# Patient Record
Sex: Female | Born: 1935
Health system: Southern US, Community
[De-identification: ages and names within clinical notes are randomized; demographics above are authoritative.]

## PROBLEM LIST (undated history)

## (undated) DIAGNOSIS — R928 Other abnormal and inconclusive findings on diagnostic imaging of breast: Secondary | ICD-10-CM

## (undated) DIAGNOSIS — E119 Type 2 diabetes mellitus without complications: Secondary | ICD-10-CM

## (undated) DIAGNOSIS — L570 Actinic keratosis: Secondary | ICD-10-CM

## (undated) DIAGNOSIS — I10 Essential (primary) hypertension: Secondary | ICD-10-CM

## (undated) DIAGNOSIS — Z960 Presence of urogenital implants: Secondary | ICD-10-CM

## (undated) DIAGNOSIS — Z1239 Encounter for other screening for malignant neoplasm of breast: Secondary | ICD-10-CM

## (undated) DIAGNOSIS — R920 Mammographic microcalcification found on diagnostic imaging of breast: Secondary | ICD-10-CM

## (undated) HISTORY — DX: Essential (primary) hypertension: I10

## (undated) HISTORY — DX: Type 2 diabetes mellitus without complications: E11.9

## (undated) HISTORY — DX: Encounter for other screening for malignant neoplasm of breast: Z12.39

## (undated) HISTORY — DX: Actinic keratosis: L57.0

## (undated) HISTORY — DX: Other abnormal and inconclusive findings on diagnostic imaging of breast: R92.8

## (undated) HISTORY — DX: Presence of urogenital implants: Z96.0

## (undated) HISTORY — DX: Mammographic microcalcification found on diagnostic imaging of breast: R92.0

## (undated) HISTORY — PX: SKIN LESION EXCISION: SHX2412

## (undated) HISTORY — PX: BREAST EXCISIONAL BIOPSY: SUR124

---

## 2004-03-08 ENCOUNTER — Ambulatory Visit: Payer: Self-pay | Admitting: Family Medicine

## 2004-04-08 ENCOUNTER — Ambulatory Visit: Payer: Self-pay | Admitting: Family Medicine

## 2004-05-08 ENCOUNTER — Ambulatory Visit: Payer: Self-pay | Admitting: Family Medicine

## 2004-10-13 ENCOUNTER — Ambulatory Visit: Payer: Self-pay | Admitting: Family Medicine

## 2004-11-06 ENCOUNTER — Ambulatory Visit: Payer: Self-pay | Admitting: Family Medicine

## 2004-12-24 ENCOUNTER — Ambulatory Visit: Payer: Self-pay

## 2005-01-01 ENCOUNTER — Ambulatory Visit: Payer: Self-pay | Admitting: Family Medicine

## 2005-01-07 ENCOUNTER — Other Ambulatory Visit: Payer: Self-pay

## 2005-01-07 ENCOUNTER — Ambulatory Visit: Payer: Self-pay | Admitting: Unknown Physician Specialty

## 2005-01-19 ENCOUNTER — Ambulatory Visit: Payer: Self-pay | Admitting: Unknown Physician Specialty

## 2006-02-03 ENCOUNTER — Ambulatory Visit: Payer: Self-pay | Admitting: Family Medicine

## 2007-03-08 ENCOUNTER — Ambulatory Visit: Payer: Self-pay | Admitting: Family Medicine

## 2008-01-24 ENCOUNTER — Ambulatory Visit: Payer: Self-pay | Admitting: Specialist

## 2008-02-02 ENCOUNTER — Ambulatory Visit: Payer: Self-pay | Admitting: Unknown Physician Specialty

## 2008-03-13 ENCOUNTER — Ambulatory Visit: Payer: Self-pay | Admitting: Family Medicine

## 2008-08-15 ENCOUNTER — Ambulatory Visit: Payer: Self-pay | Admitting: Family Medicine

## 2008-10-19 ENCOUNTER — Emergency Department: Payer: Self-pay | Admitting: Emergency Medicine

## 2008-11-02 ENCOUNTER — Inpatient Hospital Stay: Payer: Self-pay | Admitting: Surgery

## 2008-11-07 ENCOUNTER — Encounter: Payer: Self-pay | Admitting: Internal Medicine

## 2008-12-06 ENCOUNTER — Encounter: Payer: Self-pay | Admitting: Internal Medicine

## 2009-03-15 ENCOUNTER — Ambulatory Visit: Payer: Self-pay | Admitting: Family Medicine

## 2010-03-17 ENCOUNTER — Ambulatory Visit: Payer: Self-pay | Admitting: Family Medicine

## 2011-04-07 ENCOUNTER — Ambulatory Visit: Payer: Self-pay | Admitting: Family Medicine

## 2011-06-09 HISTORY — PX: BREAST BIOPSY: SHX20

## 2012-02-11 ENCOUNTER — Emergency Department: Payer: Self-pay | Admitting: Emergency Medicine

## 2012-02-11 LAB — BASIC METABOLIC PANEL
BUN: 24 mg/dL — ABNORMAL HIGH (ref 7–18)
Calcium, Total: 9.1 mg/dL (ref 8.5–10.1)
Chloride: 107 mmol/L (ref 98–107)
Creatinine: 1.42 mg/dL — ABNORMAL HIGH (ref 0.60–1.30)
Osmolality: 286 (ref 275–301)
Potassium: 3.9 mmol/L (ref 3.5–5.1)
Sodium: 140 mmol/L (ref 136–145)

## 2012-02-11 LAB — URINALYSIS, COMPLETE
Bacteria: NONE SEEN
Nitrite: NEGATIVE
Protein: NEGATIVE
RBC,UR: 11 /HPF (ref 0–5)
WBC UR: 118 /HPF (ref 0–5)

## 2012-02-11 LAB — CBC WITH DIFFERENTIAL/PLATELET
Basophil %: 1.1 %
Eosinophil #: 0.2 10*3/uL (ref 0.0–0.7)
Eosinophil %: 1.8 %
HCT: 38.6 % (ref 35.0–47.0)
Monocyte #: 0.9 x10 3/mm (ref 0.2–0.9)
Monocyte %: 8 %
Neutrophil %: 62 %
Platelet: 282 10*3/uL (ref 150–440)
RBC: 3.89 10*6/uL (ref 3.80–5.20)
WBC: 10.8 10*3/uL (ref 3.6–11.0)

## 2012-03-02 ENCOUNTER — Ambulatory Visit: Payer: Self-pay | Admitting: Hematology and Oncology

## 2012-03-02 LAB — CBC CANCER CENTER
Basophil #: 0.1 x10 3/mm (ref 0.0–0.1)
Lymphocyte #: 3.2 x10 3/mm (ref 1.0–3.6)
Lymphocyte %: 26.7 %
MCH: 33 pg (ref 26.0–34.0)
MCHC: 32.7 g/dL (ref 32.0–36.0)
MCV: 101 fL — ABNORMAL HIGH (ref 80–100)
Monocyte #: 1 x10 3/mm — ABNORMAL HIGH (ref 0.2–0.9)
Neutrophil %: 60.7 %
Platelet: 309 x10 3/mm (ref 150–440)
RDW: 14.6 % — ABNORMAL HIGH (ref 11.5–14.5)

## 2012-03-02 LAB — URINALYSIS, COMPLETE
Blood: NEGATIVE
Hyaline Cast: 1
Ketone: NEGATIVE
Nitrite: NEGATIVE
Ph: 5 (ref 4.5–8.0)
Protein: NEGATIVE
Specific Gravity: 1.019 (ref 1.003–1.030)

## 2012-03-08 ENCOUNTER — Ambulatory Visit: Payer: Self-pay | Admitting: Hematology and Oncology

## 2012-04-18 ENCOUNTER — Ambulatory Visit: Payer: Self-pay | Admitting: Family Medicine

## 2012-04-20 ENCOUNTER — Ambulatory Visit: Payer: Self-pay | Admitting: Family Medicine

## 2012-05-16 ENCOUNTER — Ambulatory Visit: Payer: Self-pay | Admitting: General Surgery

## 2012-05-21 ENCOUNTER — Encounter: Payer: Self-pay | Admitting: *Deleted

## 2013-04-21 ENCOUNTER — Ambulatory Visit: Payer: Self-pay | Admitting: Family Medicine

## 2014-05-05 ENCOUNTER — Emergency Department: Payer: Self-pay | Admitting: Emergency Medicine

## 2014-05-05 LAB — COMPREHENSIVE METABOLIC PANEL
ALT: 43 U/L
Albumin: 3.4 g/dL (ref 3.4–5.0)
Alkaline Phosphatase: 77 U/L
Anion Gap: 6 — ABNORMAL LOW (ref 7–16)
BUN: 24 mg/dL — AB (ref 7–18)
Bilirubin,Total: 0.9 mg/dL (ref 0.2–1.0)
CALCIUM: 9.1 mg/dL (ref 8.5–10.1)
CO2: 27 mmol/L (ref 21–32)
Chloride: 103 mmol/L (ref 98–107)
Creatinine: 1.47 mg/dL — ABNORMAL HIGH (ref 0.60–1.30)
EGFR (African American): 43 — ABNORMAL LOW
EGFR (Non-African Amer.): 36 — ABNORMAL LOW
Glucose: 226 mg/dL — ABNORMAL HIGH (ref 65–99)
OSMOLALITY: 283 (ref 275–301)
POTASSIUM: 3.9 mmol/L (ref 3.5–5.1)
SGOT(AST): 33 U/L (ref 15–37)
Sodium: 136 mmol/L (ref 136–145)
Total Protein: 7 g/dL (ref 6.4–8.2)

## 2014-05-05 LAB — URINALYSIS, COMPLETE
Bacteria: NONE SEEN
Bilirubin,UR: NEGATIVE
Blood: NEGATIVE
GLUCOSE, UR: NEGATIVE mg/dL (ref 0–75)
Hyaline Cast: 2
KETONE: NEGATIVE
Nitrite: NEGATIVE
PH: 6 (ref 4.5–8.0)
Protein: NEGATIVE
RBC,UR: <1 /HPF (ref 0–5)
Specific Gravity: 1.008 (ref 1.003–1.030)
Squamous Epithelial: 4

## 2014-05-05 LAB — CBC
HCT: 38 % (ref 35.0–47.0)
HGB: 12.6 g/dL (ref 12.0–16.0)
MCH: 33.8 pg (ref 26.0–34.0)
MCHC: 33.2 g/dL (ref 32.0–36.0)
MCV: 102 fL — ABNORMAL HIGH (ref 80–100)
Platelet: 284 10*3/uL (ref 150–440)
RBC: 3.73 10*6/uL — AB (ref 3.80–5.20)
RDW: 14 % (ref 11.5–14.5)
WBC: 10.7 10*3/uL (ref 3.6–11.0)

## 2014-05-05 LAB — LIPASE, BLOOD: Lipase: 175 U/L (ref 73–393)

## 2014-05-15 ENCOUNTER — Ambulatory Visit: Payer: Self-pay | Admitting: Family Medicine

## 2014-07-11 ENCOUNTER — Ambulatory Visit (INDEPENDENT_AMBULATORY_CARE_PROVIDER_SITE_OTHER): Payer: Medicare Other | Admitting: Podiatry

## 2014-07-11 ENCOUNTER — Ambulatory Visit (INDEPENDENT_AMBULATORY_CARE_PROVIDER_SITE_OTHER): Payer: Medicare Other

## 2014-07-11 ENCOUNTER — Encounter: Payer: Self-pay | Admitting: Podiatry

## 2014-07-11 VITALS — BP 131/71 | HR 66 | Resp 16 | Ht 66.0 in | Wt 140.0 lb

## 2014-07-11 DIAGNOSIS — E785 Hyperlipidemia, unspecified: Secondary | ICD-10-CM | POA: Insufficient documentation

## 2014-07-11 DIAGNOSIS — L600 Ingrowing nail: Secondary | ICD-10-CM

## 2014-07-11 DIAGNOSIS — E1142 Type 2 diabetes mellitus with diabetic polyneuropathy: Secondary | ICD-10-CM | POA: Insufficient documentation

## 2014-07-11 DIAGNOSIS — I2510 Atherosclerotic heart disease of native coronary artery without angina pectoris: Secondary | ICD-10-CM | POA: Insufficient documentation

## 2014-07-11 DIAGNOSIS — I471 Supraventricular tachycardia: Secondary | ICD-10-CM | POA: Insufficient documentation

## 2014-07-11 DIAGNOSIS — E1169 Type 2 diabetes mellitus with other specified complication: Secondary | ICD-10-CM | POA: Insufficient documentation

## 2014-07-11 DIAGNOSIS — I10 Essential (primary) hypertension: Secondary | ICD-10-CM | POA: Insufficient documentation

## 2014-07-11 DIAGNOSIS — M2040 Other hammer toe(s) (acquired), unspecified foot: Secondary | ICD-10-CM

## 2014-07-11 DIAGNOSIS — E039 Hypothyroidism, unspecified: Secondary | ICD-10-CM | POA: Insufficient documentation

## 2014-07-11 DIAGNOSIS — M542 Cervicalgia: Secondary | ICD-10-CM | POA: Insufficient documentation

## 2014-07-11 DIAGNOSIS — E119 Type 2 diabetes mellitus without complications: Secondary | ICD-10-CM

## 2014-07-11 DIAGNOSIS — I4891 Unspecified atrial fibrillation: Secondary | ICD-10-CM | POA: Insufficient documentation

## 2014-07-11 MED ORDER — NEOMYCIN-POLYMYXIN-HC 3.5-10000-1 OT SOLN: OTIC | Status: DC

## 2014-07-11 NOTE — Progress Notes (Signed)
   Subjective:    Patient ID: Erika Ochoa, female    DOB: [birth year], 81 y.o.   MRN: [id number]  HPI Comments: Left great toenail has been giving me trouble for about a month or more. Left great medial corner  Red and sore  Diabetes , blood sugar yesterday was 130      Review of Systems  HENT: Positive for hearing loss.   Endocrine:       Diabetes   All other systems reviewed and are negative.      Objective:   Physical Exam: I have reviewed her past medical history medications allergy surgery social history and review of systems. Pulses are strongly palpable bilateral. Slight decreased sensorium per Semmes-Weinstein monofilament bilateral. Deep tendon reflexes are intact bilateral and muscle strength is 5 over 5 dorsiflexes plantar flexors and inverters everters all digits of musculature is intact. Orthopedic evaluation and straight all joints distal to the ankle for range of motion without crepitation. Cutaneous evaluation demonstrates a well-hydrated cutis with exception of a sharper incurvated nail margin to the tibial border of the hallux left with erythema and mild purulence.      Assessment & Plan:  Assessment: Ingrown nail paronychia abscess hallux left tibial border.  Plan: Chemical matrixectomy was performed today after local anesthetic was achieved. Matrixectomy was performed utilizing 3 applications of phenol which was utilized isopropyl alcohol and a dry sterile compressive dressing was placed. She was given both oral and written home-going instructions as well as a prescription for Cortisporin otic and its use. I will follow up with her in 1 week.

## 2014-07-11 NOTE — Patient Instructions (Addendum)
Diabetes and Foot Care Diabetes may cause you to have problems because of poor blood supply (circulation) to your feet and legs. This may cause the skin on your feet to become thinner, break easier, and heal more slowly. Your skin may become dry, and the skin may peel and crack. You may also have nerve damage in your legs and feet causing decreased feeling in them. You may not notice minor injuries to your feet that could lead to infections or more serious problems. Taking care of your feet is one of the most important things you can do for yourself.  HOME CARE INSTRUCTIONS  Wear shoes at all times, even in the house. Do not go barefoot. Bare feet are easily injured.  Check your feet daily for blisters, cuts, and redness. If you cannot see the bottom of your feet, use a mirror or ask someone for help.  Wash your feet with warm water (do not use hot water) and mild soap. Then pat your feet and the areas between your toes until they are completely dry. Do not soak your feet as this can dry your skin.  Apply a moisturizing lotion or petroleum jelly (that does not contain alcohol and is unscented) to the skin on your feet and to dry, brittle toenails. Do not apply lotion between your toes.  Trim your toenails straight across. Do not dig under them or around the cuticle. File the edges of your nails with an emery board or nail file.  Do not cut corns or calluses or try to remove them with medicine.  Wear clean socks or stockings every day. Make sure they are not too tight. Do not wear knee-high stockings since they may decrease blood flow to your legs.  Wear shoes that fit properly and have enough cushioning. To break in new shoes, wear them for just a few hours a day. This prevents you from injuring your feet. Always look in your shoes before you put them on to be sure there are no objects inside.  Do not cross your legs. This may decrease the blood flow to your feet.  If you find a minor scrape,  cut, or break in the skin on your feet, keep it and the skin around it clean and dry. These areas may be cleansed with mild soap and water. Do not cleanse the area with peroxide, alcohol, or iodine.  When you remove an adhesive bandage, be sure not to damage the skin around it.  If you have a wound, look at it several times a day to make sure it is healing.  Do not use heating pads or hot water bottles. They may burn your skin. If you have lost feeling in your feet or legs, you may not know it is happening until it is too late.  Make sure your health care provider performs a complete foot exam at least annually or more often if you have foot problems. Report any cuts, sores, or bruises to your health care provider immediately. SEEK MEDICAL CARE IF:   You have an injury that is not healing.  You have cuts or breaks in the skin.  You have an ingrown nail.  You notice redness on your legs or feet.  You feel burning or tingling in your legs or feet.  You have pain or cramps in your legs and feet.  Your legs or feet are numb.  Your feet always feel cold. SEEK IMMEDIATE MEDICAL CARE IF:   There is increasing redness,   swelling, or pain in or around a wound.  There is a red line that goes up your leg.  Pus is coming from a wound.  You develop a fever or as directed by your health care provider.  You notice a bad smell coming from an ulcer or wound. Document Released: 05/22/2000 Document Revised: 01/25/2013 Document Reviewed: 11/01/2012 [facility] Patient Information 2015 [place], [place]. This information is not intended to replace advice given to you by your health care provider. Make sure you discuss any questions you have with your health care provider. Betadine Soak Instructions  Purchase an 8 oz. bottle of BETADINE solution (Povidone)  THE DAY AFTER THE PROCEDURE  Place 1 tablespoon of betadine solution in a quart of warm tap water.  Submerge your foot or feet with outer  bandage intact for the initial soak; this will allow the bandage to become moist and wet for easy lift off.  Once you remove your bandage, continue to soak in the solution for 20 minutes.  This soak should be done twice a day.  Next, remove your foot or feet from solution, blot dry the affected area and cover.  You may use a band aid large enough to cover the area or use gauze and tape.  Apply other medications to the area as directed by the doctor such as cortisporin otic solution (ear drops) or neosporin.  IF YOUR SKIN BECOMES IRRITATED WHILE USING THESE INSTRUCTIONS, IT IS OKAY TO SWITCH TO EPSOM SALTS AND WATER OR WHITE VINEGAR AND WATER.

## 2014-07-18 ENCOUNTER — Ambulatory Visit (INDEPENDENT_AMBULATORY_CARE_PROVIDER_SITE_OTHER): Payer: Medicare Other | Admitting: Podiatry

## 2014-07-18 DIAGNOSIS — E119 Type 2 diabetes mellitus without complications: Secondary | ICD-10-CM

## 2014-07-18 DIAGNOSIS — L600 Ingrowing nail: Secondary | ICD-10-CM

## 2014-07-18 NOTE — Progress Notes (Signed)
She presents today for follow-up of her matrixectomy tibial and fibular border of the hallux left. She states that seems to be doing just fine and continues to be soaking in Betadine and warm water regularly.  Objective: No erythema edema cellulitis drainage or odor to the hallux left.  Assessment: Well-healed surgical toe hallux left.  Plan: Discontinue the use of Betadine. Start with Epsom salts and warm water soaks twice daily continue the use of Cortisporin otic twice daily after soaking. She will also covered in the daily open at night with a Band-Aid I will follow up with her in 1-2 weeks if necessary. Otherwise she is to continue to soak until completely resolved.

## 2014-11-28 ENCOUNTER — Other Ambulatory Visit: Payer: Self-pay | Admitting: Gastroenterology

## 2014-11-28 DIAGNOSIS — R1084 Generalized abdominal pain: Secondary | ICD-10-CM

## 2014-11-28 DIAGNOSIS — R140 Abdominal distension (gaseous): Secondary | ICD-10-CM

## 2014-12-03 ENCOUNTER — Ambulatory Visit
Admission: RE | Admit: 2014-12-03 | Discharge: 2014-12-03 | Disposition: A | Discharge status: Home / Self Care | Payer: Medicare Other | Source: Ambulatory Visit | Attending: Gastroenterology | Admitting: Gastroenterology

## 2014-12-03 DIAGNOSIS — R140 Abdominal distension (gaseous): Secondary | ICD-10-CM

## 2014-12-03 DIAGNOSIS — R1084 Generalized abdominal pain: Secondary | ICD-10-CM

## 2014-12-03 DIAGNOSIS — R109 Unspecified abdominal pain: Secondary | ICD-10-CM | POA: Diagnosis present

## 2014-12-03 MED ORDER — IOHEXOL 300 MG/ML  SOLN
75.0000 mL | Freq: Once | INTRAMUSCULAR | Status: AC | PRN
  Administered 2014-12-03: 75 mL via INTRAVENOUS

## 2015-02-25 DIAGNOSIS — E538 Deficiency of other specified B group vitamins: Secondary | ICD-10-CM | POA: Insufficient documentation

## 2015-05-13 ENCOUNTER — Other Ambulatory Visit: Payer: Self-pay | Admitting: Family Medicine

## 2015-05-13 DIAGNOSIS — Z1231 Encounter for screening mammogram for malignant neoplasm of breast: Secondary | ICD-10-CM

## 2015-05-20 ENCOUNTER — Ambulatory Visit
Admission: RE | Admit: 2015-05-20 | Discharge: 2015-05-20 | Disposition: A | Discharge status: Home / Self Care | Payer: Medicare Other | Source: Ambulatory Visit | Attending: Family Medicine | Admitting: Family Medicine

## 2015-05-20 ENCOUNTER — Other Ambulatory Visit: Payer: Self-pay | Admitting: Family Medicine

## 2015-05-20 DIAGNOSIS — Z1231 Encounter for screening mammogram for malignant neoplasm of breast: Secondary | ICD-10-CM | POA: Diagnosis not present

## 2015-07-24 DIAGNOSIS — M180 Bilateral primary osteoarthritis of first carpometacarpal joints: Secondary | ICD-10-CM | POA: Insufficient documentation

## 2015-12-19 ENCOUNTER — Observation Stay
Admission: EM | Admit: 2015-12-19 | Discharge: 2015-12-20 | Disposition: A | Discharge status: Home / Self Care | Payer: Medicare Other | Attending: Internal Medicine | Admitting: Internal Medicine

## 2015-12-19 ENCOUNTER — Encounter: Payer: Self-pay | Admitting: Emergency Medicine

## 2015-12-19 ENCOUNTER — Emergency Department: Payer: Medicare Other

## 2015-12-19 DIAGNOSIS — E1122 Type 2 diabetes mellitus with diabetic chronic kidney disease: Secondary | ICD-10-CM | POA: Insufficient documentation

## 2015-12-19 DIAGNOSIS — N183 Chronic kidney disease, stage 3 (moderate): Secondary | ICD-10-CM | POA: Diagnosis not present

## 2015-12-19 DIAGNOSIS — I700 Atherosclerosis of aorta: Secondary | ICD-10-CM | POA: Insufficient documentation

## 2015-12-19 DIAGNOSIS — G459 Transient cerebral ischemic attack, unspecified: Principal | ICD-10-CM

## 2015-12-19 DIAGNOSIS — E039 Hypothyroidism, unspecified: Secondary | ICD-10-CM | POA: Diagnosis not present

## 2015-12-19 DIAGNOSIS — I1310 Hypertensive heart and chronic kidney disease without heart failure, with stage 1 through stage 4 chronic kidney disease, or unspecified chronic kidney disease: Secondary | ICD-10-CM | POA: Diagnosis not present

## 2015-12-19 DIAGNOSIS — Z7984 Long term (current) use of oral hypoglycemic drugs: Secondary | ICD-10-CM | POA: Diagnosis not present

## 2015-12-19 DIAGNOSIS — Z7982 Long term (current) use of aspirin: Secondary | ICD-10-CM | POA: Insufficient documentation

## 2015-12-19 DIAGNOSIS — I10 Essential (primary) hypertension: Secondary | ICD-10-CM | POA: Diagnosis present

## 2015-12-19 DIAGNOSIS — E1142 Type 2 diabetes mellitus with diabetic polyneuropathy: Secondary | ICD-10-CM

## 2015-12-19 DIAGNOSIS — Z880 Allergy status to penicillin: Secondary | ICD-10-CM | POA: Diagnosis not present

## 2015-12-19 DIAGNOSIS — N189 Chronic kidney disease, unspecified: Secondary | ICD-10-CM | POA: Diagnosis present

## 2015-12-19 DIAGNOSIS — E785 Hyperlipidemia, unspecified: Secondary | ICD-10-CM | POA: Diagnosis present

## 2015-12-19 DIAGNOSIS — N179 Acute kidney failure, unspecified: Secondary | ICD-10-CM | POA: Diagnosis present

## 2015-12-19 DIAGNOSIS — Z79899 Other long term (current) drug therapy: Secondary | ICD-10-CM | POA: Diagnosis not present

## 2015-12-19 DIAGNOSIS — E1169 Type 2 diabetes mellitus with other specified complication: Secondary | ICD-10-CM

## 2015-12-19 DIAGNOSIS — Z791 Long term (current) use of non-steroidal anti-inflammatories (NSAID): Secondary | ICD-10-CM | POA: Insufficient documentation

## 2015-12-19 LAB — CBC
HEMATOCRIT: 38.4 % (ref 35.0–47.0)
HEMOGLOBIN: 13 g/dL (ref 12.0–16.0)
MCH: 33.1 pg (ref 26.0–34.0)
MCHC: 33.8 g/dL (ref 32.0–36.0)
MCV: 98 fL (ref 80.0–100.0)
PLATELETS: 271 10*3/uL (ref 150–440)
RBC: 3.92 MIL/uL (ref 3.80–5.20)
RDW: 14.4 % (ref 11.5–14.5)
WBC: 11 10*3/uL (ref 3.6–11.0)

## 2015-12-19 LAB — COMPREHENSIVE METABOLIC PANEL
ALBUMIN: 4.2 g/dL (ref 3.5–5.0)
ALK PHOS: 68 U/L (ref 38–126)
ALT: 25 U/L (ref 14–54)
ANION GAP: 9 (ref 5–15)
AST: 28 U/L (ref 15–41)
BILIRUBIN TOTAL: 0.9 mg/dL (ref 0.3–1.2)
BUN: 32 mg/dL — AB (ref 6–20)
CALCIUM: 9.2 mg/dL (ref 8.9–10.3)
CO2: 26 mmol/L (ref 22–32)
Chloride: 103 mmol/L (ref 101–111)
Creatinine, Ser: 1.48 mg/dL — ABNORMAL HIGH (ref 0.44–1.00)
GFR calc Af Amer: 35 mL/min — ABNORMAL LOW (ref >60)
GFR calc non Af Amer: 30 mL/min — ABNORMAL LOW (ref >60)
GLUCOSE: 201 mg/dL — AB (ref 65–99)
POTASSIUM: 3.9 mmol/L (ref 3.5–5.1)
SODIUM: 138 mmol/L (ref 135–145)
Total Protein: 7.3 g/dL (ref 6.5–8.1)

## 2015-12-19 MED ORDER — HEPARIN SODIUM (PORCINE) 5000 UNIT/ML IJ SOLN
5000.0000 [IU] | Freq: Three times a day (TID) | INTRAMUSCULAR | Status: DC
  Administered 2015-12-20: 5000 [IU] via SUBCUTANEOUS
  Filled 2015-12-19 (×2): qty 1

## 2015-12-19 MED ORDER — SIMVASTATIN 20 MG PO TABS: 20.0000 mg | ORAL_TABLET | Freq: Every evening | ORAL | Status: DC

## 2015-12-19 MED ORDER — LEVOTHYROXINE SODIUM 50 MCG PO TABS
50.0000 ug | ORAL_TABLET | Freq: Every day | ORAL | Status: DC
  Administered 2015-12-20: 50 ug via ORAL
  Filled 2015-12-19: qty 1

## 2015-12-19 MED ORDER — IRBESARTAN 150 MG PO TABS
300.0000 mg | ORAL_TABLET | Freq: Every day | ORAL | Status: DC
  Administered 2015-12-20: 12:00:00 300 mg via ORAL
  Filled 2015-12-19: qty 2

## 2015-12-19 MED ORDER — STROKE: EARLY STAGES OF RECOVERY BOOK
Freq: Once | Status: AC
  Administered 2015-12-20

## 2015-12-19 MED ORDER — HYDROCHLOROTHIAZIDE 12.5 MG PO CAPS
12.5000 mg | ORAL_CAPSULE | Freq: Every day | ORAL | Status: DC
  Administered 2015-12-20: 12:00:00 12.5 mg via ORAL
  Filled 2015-12-19: qty 1

## 2015-12-19 MED ORDER — LABETALOL HCL 5 MG/ML IV SOLN
10.0000 mg | Freq: Once | INTRAVENOUS | Status: AC
  Administered 2015-12-19: 10 mg via INTRAVENOUS
  Filled 2015-12-19: qty 4

## 2015-12-19 MED ORDER — ASPIRIN 81 MG PO CHEW
81.0000 mg | CHEWABLE_TABLET | Freq: Every day | ORAL | Status: DC
  Administered 2015-12-20: 12:00:00 81 mg via ORAL
  Filled 2015-12-19: qty 1

## 2015-12-19 MED ORDER — IRBESARTAN-HYDROCHLOROTHIAZIDE 300-12.5 MG PO TABS: 1.0000 | ORAL_TABLET | Freq: Every day | ORAL | Status: DC

## 2015-12-19 MED ORDER — METOPROLOL TARTRATE 25 MG PO TABS
25.0000 mg | ORAL_TABLET | Freq: Two times a day (BID) | ORAL | Status: DC
  Administered 2015-12-20 (×2): 25 mg via ORAL
  Filled 2015-12-19 (×2): qty 1
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
## 2015-12-19 NOTE — H&P (Signed)
[facility] [place] NAME: Erika Ochoa    MR#:  [id number]  DATE OF BIRTH:  Mar 26, 1925  DATE OF ADMISSION:  12/19/2015  PRIMARY CARE PHYSICIAN: [place] Pink, MD   REQUESTING/REFERRING PHYSICIAN: Cinda Quest, MD  CHIEF COMPLAINT:   Chief Complaint  Patient presents with  . Eye Problem  . Altered Mental Status    HISTORY OF PRESENT ILLNESS:  Erika Ochoa  is a 82 y.o. female who presents with An episode concerning for possible TIA. Patient states that 24 hours prior to presentation the ED she was in a grocery store when she came out she did not recognize where she was. This was transient and lasted only a few moments until she did finally recognize her car. However, than when she was driving home she had some difficult to describe visual disturbance involving the stoplights where she feels like she saw stoplights even after she had passed through the intersection. This was also transient lasted only a few moments. She contacted her physician today about this and he recommended she come to the ED for workup for TIA. Workup largely negative in the ED initially, hospitalists were called for admission for further evaluation.  PAST MEDICAL HISTORY:   Past Medical History  Diagnosis Date  . Hypertension   . Diabetes ([place])   . Breast screening, unspecified   . Mammographic microcalcification   . Abnormal mammogram, unspecified     PAST SURGICAL HISTORY:   Past Surgical History  Procedure Laterality Date  . Skin lesion excision      SOCIAL HISTORY:   Social History  Substance Use Topics  . Smoking status: Never Smoker   . Smokeless tobacco: Not on file  . Alcohol Use: No    FAMILY HISTORY:   Family History  Problem Relation Age of Onset  . Cancer Mother     DRUG ALLERGIES:   Allergies  Allergen Reactions  . Oxycodone-Aspirin Other (See Comments)    Other Reaction: GI Upset  . Augmentin [Amoxicillin-Pot Clavulanate]  Nausea Only  . Codeine Nausea Only  . Etodolac Other (See Comments)  . Iodine Nausea Only  . Levaquin [Levofloxacin In D5w] Nausea Only  . Levofloxacin Nausea Only  . Metronidazole Nausea Only  . Sulfa Antibiotics Nausea Only  . Ultram [Tramadol] Nausea Only    MEDICATIONS AT HOME:   Prior to Admission medications   Medication Sig Start Date End Date Taking? Authorizing Provider  allopurinol (ZYLOPRIM) 100 MG tablet Take 100 mg by mouth daily.    Yes Historical Provider, MD  aspirin 81 MG tablet Take 81 mg by mouth daily.   Yes Historical Provider, MD  Dulaglutide (TRULICITY) 1.5 0000000 SOPN Inject 1.5 mg into the skin every 7 (seven) days.   Yes Historical Provider, MD  irbesartan-hydrochlorothiazide (AVALIDE) 300-12.5 MG per tablet Take 1 tablet by mouth daily.    Yes Historical Provider, MD  levothyroxine (SYNTHROID, LEVOTHROID) 50 MCG tablet Take 50 mcg by mouth daily.    Yes Historical Provider, MD  meloxicam (MOBIC) 15 MG tablet Take 15 mg by mouth daily.   Yes Historical Provider, MD  metFORMIN (GLUCOPHAGE-XR) 500 MG 24 hr tablet Take 500 mg by mouth daily with breakfast.   Yes Historical Provider, MD  metoprolol tartrate (LOPRESSOR) 25 MG tablet Take 25 mg by mouth 2 (two) times daily.   Yes Historical Provider, MD  simvastatin (ZOCOR) 20 MG tablet Take 20 mg by mouth every evening.    Yes  Historical Provider, MD  Vitamin D, Ergocalciferol, (DRISDOL) 50000 UNITS CAPS capsule Take 50,000 Units by mouth every 7 (seven) days.    Yes Historical Provider, MD  levothyroxine (SYNTHROID, LEVOTHROID) 50 MCG tablet Take 50 mcg by mouth daily.    Historical Provider, MD  neomycin-polymyxin-hydrocortisone (CORTISPORIN) otic solution Apply one to two drops to toe after soaking twice daily. Patient not taking: Reported on 12/19/2015 07/11/14   Max Villa Herb, DPM    REVIEW OF SYSTEMS:  Review of Systems  Constitutional: Negative for fever, chills, weight loss and malaise/fatigue.  HENT:  Negative for ear pain, hearing loss and tinnitus.   Eyes: Negative for blurred vision, double vision, pain and redness.  Respiratory: Negative for cough, hemoptysis and shortness of breath.   Cardiovascular: Negative for chest pain, palpitations, orthopnea and leg swelling.  Gastrointestinal: Negative for nausea, vomiting, abdominal pain, diarrhea and constipation.  Genitourinary: Negative for dysuria, frequency and hematuria.  Musculoskeletal: Negative for back pain, joint pain and neck pain.  Skin:       No acne, rash, or lesions  Neurological: Negative for dizziness, tremors, focal weakness and weakness.       Transient confusion  Endo/Heme/Allergies: Negative for polydipsia. Does not bruise/bleed easily.  Psychiatric/Behavioral: Negative for depression. The patient is not nervous/anxious and does not have insomnia.      VITAL SIGNS:   Filed Vitals:   12/19/15 1843 12/19/15 2054 12/19/15 2100 12/19/15 2130  BP: 209/90 183/84 157/75 165/85  Pulse: 73 74 77 76  Temp: 98 F (36.7 C)     TempSrc: Oral     Resp: 18 16 15 20   Height: 5\' 6"  (1.676 m)     Weight: 61.236 kg (135 lb)     SpO2: 98% 95% 94% 97%   Wt Readings from Last 3 Encounters:  12/19/15 61.236 kg (135 lb)  12/03/14 63.504 kg (140 lb)  07/11/14 63.504 kg (140 lb)    PHYSICAL EXAMINATION:  Physical Exam  Vitals reviewed. Constitutional: She is oriented to person, place, and time. She appears well-developed and well-nourished. No distress.  HENT:  Head: Normocephalic and atraumatic.  Mouth/Throat: Oropharynx is clear and moist.  Eyes: Conjunctivae and EOM are normal. Pupils are equal, round, and reactive to light. No scleral icterus.  Neck: Normal range of motion. Neck supple. No JVD present. No thyromegaly present.  Cardiovascular: Normal rate, regular rhythm and intact distal pulses.  Exam reveals no gallop and no friction rub.   Murmur (2/6 systolic murmur) heard. Respiratory: Effort normal and breath sounds  normal. No respiratory distress. She has no wheezes. She has no rales.  GI: Soft. Bowel sounds are normal. She exhibits no distension. There is no tenderness.  Musculoskeletal: Normal range of motion. She exhibits no edema.  No arthritis, no gout  Lymphadenopathy:    She has no cervical adenopathy.  Neurological: She is alert and oriented to person, place, and time. No cranial nerve deficit.  Neurologic: Cranial nerves II-XII intact, Sensation intact to light touch/pinprick, 5/5 strength in all extremities, no dysarthria, no aphasia, no dysphagia, memory intact, no pronator drift, DTR intact.   Skin: Skin is warm and dry. No rash noted. No erythema.  Psychiatric: She has a normal mood and affect. Her behavior is normal. Judgment and thought content normal.    LABORATORY PANEL:   CBC  Recent Labs Lab 12/19/15 1851  WBC 11.0  HGB 13.0  HCT 38.4  PLT 271   ------------------------------------------------------------------------------------------------------------------  Chemistries   Recent Labs Lab  12/19/15 1851  NA 138  K 3.9  CL 103  CO2 26  GLUCOSE 201*  BUN 32*  CREATININE 1.48*  CALCIUM 9.2  AST 28  ALT 25  ALKPHOS 68  BILITOT 0.9   ------------------------------------------------------------------------------------------------------------------  Cardiac Enzymes No results for input(s): TROPONINI in the last 168 hours. ------------------------------------------------------------------------------------------------------------------  RADIOLOGY:  Ct Head Wo Contrast  12/19/2015  CLINICAL DATA:  82 year old female with transient ischemic attack and altered mental status. EXAM: CT HEAD WITHOUT CONTRAST TECHNIQUE: Contiguous axial images were obtained from the base of the skull through the vertex without intravenous contrast. COMPARISON:  Head CT dated 02/11/2012 FINDINGS: There is moderate age-related atrophy and chronic microvascular ischemic changes. There is no  acute intracranial hemorrhage. No mass effect or midline shift. The visualized paranasal sinuses and mastoid air cells are clear. The calvarium is intact. IMPRESSION: No acute intracranial hemorrhage. Age-related atrophy and chronic microvascular ischemic disease. If symptoms persist and there are no contraindications, MRI may provide better evaluation if clinically indicated. Electronically Signed   By: Anner Crete M.D.   On: 12/19/2015 20:37   Dg Chest Portable 1 View  12/19/2015  CLINICAL DATA:  Near syncopal episode 6 days ago. Persistent confusion for several days. Worsening double vision today. EXAM: PORTABLE CHEST 1 VIEW COMPARISON:  11/04/2008 FINDINGS: Mild cardiomegaly stable. Aortic atherosclerosis noted. Both lungs are clear. No evidence of pneumothorax or pleural effusion. IMPRESSION: Mild cardiomegaly.  No active lung disease. Aortic atherosclerosis noted. Electronically Signed   By: Earle Gell M.D.   On: 12/19/2015 20:54    EKG:   Orders placed or performed during the hospital encounter of 12/19/15  . EKG 12-Lead  . EKG 12-Lead    IMPRESSION AND PLAN:  Principal Problem:   TIA (transient ischemic attack) - admission per TIA order set with imaging and labs as appropriate, specifically MRI. Neurology consult. Active Problems:   Diabetes ([place]) - sliding scale insulin with corresponding glucose checks and carb modified diet   Essential (primary) hypertension - continue home meds, additional when necessary antihypertensives to control blood pressure   HLD (hyperlipidemia) - continue home meds, check lipid panel as part of above workup   Adult hypothyroidism - home dose thyroid replacement   CKD (chronic kidney disease), stage III - currently stable, avoid nephrotoxins  All the records are reviewed and case discussed with ED provider. Management plans discussed with the patient and/or family.  DVT PROPHYLAXIS: SubQ heparin  GI PROPHYLAXIS: None  ADMISSION STATUS:  Observation  CODE STATUS: Full Code Status History    This patient does not have a recorded code status. Please follow your organizational policy for patients in this situation.    Advance Directive Documentation        Most Recent Value   Type of Advance Directive  Healthcare Power of Attorney   Pre-existing out of facility DNR order (yellow form or pink MOST form)     "MOST" Form in Place?        TOTAL TIME TAKING CARE OF THIS PATIENT: 40 minutes.    Guss Farruggia Kapaa 12/19/2015, 9:57 PM  [facility]  Office  [phone]  CC: Primary care physician; [place] Pink, MD

## 2015-12-19 NOTE — ED Notes (Signed)
Patient transported to CT 

## 2015-12-20 ENCOUNTER — Observation Stay: Payer: Medicare Other

## 2015-12-20 ENCOUNTER — Observation Stay
Admit: 2015-12-20 | Discharge: 2015-12-20 | Disposition: A | Discharge status: Home / Self Care | Payer: Medicare Other | Attending: Internal Medicine | Admitting: Internal Medicine

## 2015-12-20 DIAGNOSIS — H532 Diplopia: Secondary | ICD-10-CM | POA: Diagnosis not present

## 2015-12-20 LAB — ECHOCARDIOGRAM COMPLETE
HEIGHTINCHES: 65 in
WEIGHTICAEL: 2213 [oz_av]

## 2015-12-20 LAB — LIPID PANEL
Cholesterol: 158 mg/dL (ref 0–200)
HDL: 45 mg/dL (ref >40)
LDL Cholesterol: 84 mg/dL (ref 0–99)
Total CHOL/HDL Ratio: 3.5 RATIO
Triglycerides: 144 mg/dL (ref <150)
VLDL: 29 mg/dL (ref 0–40)

## 2015-12-20 LAB — HEMOGLOBIN A1C: HEMOGLOBIN A1C: 7.4 % — AB (ref 4.0–6.0)

## 2015-12-20 LAB — GLUCOSE, CAPILLARY: GLUCOSE-CAPILLARY: 130 mg/dL — AB (ref 65–99)

## 2015-12-20 LAB — TROPONIN I

## 2015-12-20 LAB — TSH: TSH: 4.821 u[IU]/mL — AB (ref 0.350–4.500)

## 2015-12-20 MED ORDER — INSULIN ASPART 100 UNIT/ML ~~LOC~~ SOLN
0.0000 [IU] | Freq: Three times a day (TID) | SUBCUTANEOUS | Status: DC
  Administered 2015-12-20: 12:00:00 1 [IU] via SUBCUTANEOUS
  Filled 2015-12-20: qty 1

## 2015-12-20 MED ORDER — LABETALOL HCL 5 MG/ML IV SOLN
10.0000 mg | INTRAVENOUS | Status: DC | PRN
Start: 2015-12-20 — End: 2015-12-20
  Administered 2015-12-20: 06:00:00 10 mg via INTRAVENOUS
  Filled 2015-12-20 (×2): qty 4

## 2015-12-20 MED ORDER — INSULIN ASPART 100 UNIT/ML ~~LOC~~ SOLN: 0.0000 [IU] | Freq: Every day | SUBCUTANEOUS | Status: DC

## 2015-12-20 NOTE — Progress Notes (Signed)
Inpatient Diabetes Program Recommendations  AACE/ADA: New Consensus Statement on Inpatient Glycemic Control (2015)  Target Ranges:  Prepandial:   less than 140 mg/dL      Peak postprandial:   less than 180 mg/dL (1-2 hours)      Critically ill patients:  140 - 180 mg/dL   No results found for: GLUCAP, HGBA1C  Review of Glycemic Control   Results for Erika Ochoa, Erika Ochoa (MRN [id number]) as of 12/20/2015 09:08- lab glucose  Ref. Range 12/19/2015 18:51  Glucose Latest Ref Range: 65-99 mg/dL 201 (H)     Diabetes history: Type 2 Outpatient Diabetes medications: Trulicity 1.5mg /week , Glucophage 500mg  with breakfast  Current orders for Inpatient glycemic control: none  Inpatient Diabetes Program Recommendations:  Patient has a history of diabetes- please order blood sugar checks tid and hs.  Please consider ordering Novolog 0-9 units tid and Novolog 0-5 units qhs  Gentry Fitz, RN, [place], [place], CDE Diabetes Coordinator Inpatient Diabetes Program  [phone] (Team Pager) [phone] ([place]) 12/20/2015 9:11 AM

## 2015-12-20 NOTE — Consult Note (Signed)
Referring Physician: Tressia Miners    Chief Complaint: Diplopia  HPI: Erika Ochoa is an 82 y.o. female with a history HTN who reports that was driving earlier this week.  When she got to the stop light saw two, one on top of the other.  Once beyond the light no further diplopia was noted.  Did not drive for a day or two but again when she got to the light saw two of them.  Again when she drove off reported no further diplopia.  She reports that if she covered one eye or the other she continued to have diplopia.  On presentation BP elevated.  NIHSS of 0.   Patient also reports that earlier in the day on leaving the store she did not recognize where she was and had difficulty recognizing her car.  This was transient as well.    Date last known well: Unable to determine Time last known well: Unable to determine tPA Given: No: Resolution of symptoms  MRankin: 0  Past Medical History  Diagnosis Date  . Hypertension   . Diabetes ([place])   . Breast screening, unspecified   . Mammographic microcalcification   . Abnormal mammogram, unspecified     Past Surgical History  Procedure Laterality Date  . Skin lesion excision      Family History  Problem Relation Age of Onset  . Cancer Mother    Social History:  reports that she has never smoked. She does not have any smokeless tobacco history on file. She reports that she does not drink alcohol or use illicit drugs.  Allergies:  Allergies  Allergen Reactions  . Oxycodone-Aspirin Other (See Comments)    Other Reaction: GI Upset  . Augmentin [Amoxicillin-Pot Clavulanate] Nausea Only  . Codeine Nausea Only  . Etodolac Other (See Comments)  . Iodine Nausea Only  . Levaquin [Levofloxacin In D5w] Nausea Only  . Levofloxacin Nausea Only  . Metronidazole Nausea Only  . Sulfa Antibiotics Nausea Only  . Ultram [Tramadol] Nausea Only    Medications:  I have reviewed the patient's current medications. Prior to Admission:  Prescriptions prior to  admission  Medication Sig Dispense Refill Last Dose  . allopurinol (ZYLOPRIM) 100 MG tablet Take 100 mg by mouth daily.    12/19/2015 at Unknown time  . aspirin 81 MG tablet Take 81 mg by mouth daily.   12/19/2015 at Unknown time  . Dulaglutide (TRULICITY) 1.5 0000000 SOPN Inject 1.5 mg into the skin every 7 (seven) days.   12/14/2015 at Unknown time  . irbesartan-hydrochlorothiazide (AVALIDE) 300-12.5 MG per tablet Take 1 tablet by mouth daily.    12/19/2015 at Unknown time  . levothyroxine (SYNTHROID, LEVOTHROID) 50 MCG tablet Take 50 mcg by mouth daily.    12/19/2015 at Unknown time  . meloxicam (MOBIC) 15 MG tablet Take 15 mg by mouth daily.   12/19/2015 at Unknown time  . metFORMIN (GLUCOPHAGE-XR) 500 MG 24 hr tablet Take 500 mg by mouth daily with breakfast.   12/19/2015 at Unknown time  . metoprolol tartrate (LOPRESSOR) 25 MG tablet Take 25 mg by mouth 2 (two) times daily.   12/19/2015 at Unknown time  . simvastatin (ZOCOR) 20 MG tablet Take 20 mg by mouth every evening.    12/19/2015 at Unknown time  . Vitamin D, Ergocalciferol, (DRISDOL) 50000 UNITS CAPS capsule Take 50,000 Units by mouth every 7 (seven) days.   99 12/19/2015  . neomycin-polymyxin-hydrocortisone (CORTISPORIN) otic solution Apply one to two drops to toe after soaking  twice daily. (Patient not taking: Reported on 12/19/2015) 10 mL 0 Not Taking at Unknown time   Scheduled: . aspirin  81 mg Oral Daily  . heparin  5,000 Units Subcutaneous Q8H  . irbesartan  300 mg Oral Daily   And  . hydrochlorothiazide  12.5 mg Oral Daily  . insulin aspart  0-5 Units Subcutaneous QHS  . insulin aspart  0-9 Units Subcutaneous TID WC  . levothyroxine  50 mcg Oral QAC breakfast  . metoprolol tartrate  25 mg Oral BID  . simvastatin  20 mg Oral QPM    ROS: History obtained from the patient  General ROS: negative for - chills, fatigue, fever, night sweats, weight gain or weight loss Psychological ROS: as noted in HPI Ophthalmic ROS: negative for  - blurry vision, double vision, eye pain or loss of vision ENT ROS: negative for - epistaxis, nasal discharge, oral lesions, sore throat, tinnitus or vertigo Allergy and Immunology ROS: negative for - hives or itchy/watery eyes Hematological and Lymphatic ROS: negative for - bleeding problems, bruising or swollen lymph nodes Endocrine ROS: negative for - galactorrhea, hair pattern changes, polydipsia/polyuria or temperature intolerance Respiratory ROS: negative for - cough, hemoptysis, shortness of breath or wheezing Cardiovascular ROS: negative for - chest pain, dyspnea on exertion, edema or irregular heartbeat Gastrointestinal ROS: negative for - abdominal pain, diarrhea, hematemesis, nausea/vomiting or stool incontinence Genito-Urinary ROS: negative for - dysuria, hematuria, incontinence or urinary frequency/urgency Musculoskeletal ROS: negative for - joint swelling or muscular weakness Neurological ROS: as noted in HPI Dermatological ROS: negative for rash and skin lesion changes  Physical Examination: Blood pressure 131/72, pulse 71, temperature 97.6 F (36.4 C), temperature source Oral, resp. rate 20, height 5\' 5"  (1.651 m), weight 62.738 kg (138 lb 5 oz), SpO2 95 %.  HEENT-  Normocephalic, no lesions, without obvious abnormality.  Normal external eye and conjunctiva.  Normal TM's bilaterally.  Normal auditory canals and external ears. Normal external nose, mucus membranes and septum.  Normal pharynx. Cardiovascular- S1, S2 normal, pulses palpable throughout   Lungs- chest clear, no wheezing, rales, normal symmetric air entry Abdomen- soft, non-tender; bowel sounds normal; no masses,  no organomegaly Extremities- no edema Lymph-no adenopathy palpable Musculoskeletal-no joint tenderness, deformity or swelling Skin-warm and dry, no hyperpigmentation, vitiligo, or suspicious lesions  Neurological Examination Mental Status: Alert, oriented, thought content appropriate.  Speech fluent  without evidence of aphasia.  Able to follow 3 step commands without difficulty. Cranial Nerves: II: Discs flat bilaterally; Visual fields grossly normal, pupils equal, round, reactive to light and accommodation III,IV, VI: ptosis not present, extra-ocular motions intact bilaterally V,VII: smile symmetric, facial light touch sensation normal bilaterally VIII: hearing normal bilaterally IX,X: gag reflex present XI: bilateral shoulder shrug XII: midline tongue extension Motor: Right : Upper extremity   5/5    Left:     Upper extremity   5/5  Lower extremity   5/5     Lower extremity   5/5 Tone and bulk:normal tone throughout; no atrophy noted Sensory: Pinprick and light touch intact throughout, bilaterally Deep Tendon Reflexes: 2+ and symmetric with absent AJ's bilaterally Plantars: Right: downgoing   Left: downgoing Cerebellar: Normal finger-to-nose and normal heel-to-shin testing bilaterally Gait: normal gait and station    Laboratory Studies:  Basic Metabolic Panel:  Recent Labs Lab 12/19/15 1851  NA 138  K 3.9  CL 103  CO2 26  GLUCOSE 201*  BUN 32*  CREATININE 1.48*  CALCIUM 9.2    Liver Function Tests:  Recent Labs Lab 12/19/15 1851  AST 28  ALT 25  ALKPHOS 68  BILITOT 0.9  PROT 7.3  ALBUMIN 4.2   No results for input(s): LIPASE, AMYLASE in the last 168 hours. No results for input(s): AMMONIA in the last 168 hours.  CBC:  Recent Labs Lab 12/19/15 1851  WBC 11.0  HGB 13.0  HCT 38.4  MCV 98.0  PLT 271    Cardiac Enzymes:  Recent Labs Lab 12/20/15 0447  TROPONINI <0.03    BNP: Invalid input(s): POCBNP  CBG:  Recent Labs Lab 12/20/15 1207  [place] 130*    Microbiology: Results for orders placed or performed in visit on 03/02/12  Urine culture     Status: None   Collection Time: 03/02/12  3:13 PM  Result Value Ref Range Status   Micro Text Report   Final       SOURCE: CLEAN CATCH URINE    COMMENT                   MIXED  BACTERIAL ORGANISMS   COMMENT                   RESULTS SUGGESTIVE OF CONTAMINATION   ANTIBIOTIC                                                        Coagulation Studies: No results for input(s): LABPROT, INR in the last 72 hours.  Urinalysis: No results for input(s): COLORURINE, LABSPEC, PHURINE, GLUCOSEU, HGBUR, BILIRUBINUR, KETONESUR, PROTEINUR, UROBILINOGEN, NITRITE, LEUKOCYTESUR in the last 168 hours.  Invalid input(s): APPERANCEUR  Lipid Panel:    Component Value Date/Time   CHOL 158 12/20/2015 0447   TRIG 144 12/20/2015 0447   HDL 45 12/20/2015 0447   CHOLHDL 3.5 12/20/2015 0447   VLDL 29 12/20/2015 0447   LDLCALC 84 12/20/2015 0447    HgbA1C: No results found for: HGBA1C  Urine Drug Screen:  No results found for: LABOPIA, COCAINSCRNUR, LABBENZ, AMPHETMU, THCU, LABBARB  Alcohol Level: No results for input(s): ETH in the last 168 hours.  Other results: EKG: normal sinus rhythm at 70 bpm.  Imaging: Ct Head Wo Contrast  12/19/2015  CLINICAL DATA:  82 year old female with transient ischemic attack and altered mental status. EXAM: CT HEAD WITHOUT CONTRAST TECHNIQUE: Contiguous axial images were obtained from the base of the skull through the vertex without intravenous contrast. COMPARISON:  Head CT dated 02/11/2012 FINDINGS: There is moderate age-related atrophy and chronic microvascular ischemic changes. There is no acute intracranial hemorrhage. No mass effect or midline shift. The visualized paranasal sinuses and mastoid air cells are clear. The calvarium is intact. IMPRESSION: No acute intracranial hemorrhage. Age-related atrophy and chronic microvascular ischemic disease. If symptoms persist and there are no contraindications, MRI may provide better evaluation if clinically indicated. Electronically Signed   By: Anner Crete M.D.   On: 12/19/2015 20:37   Mr Brain Wo Contrast  12/20/2015  CLINICAL DATA:  82 year old female with transient confusion and memory loss.  Associated visual disturbances. Possible TIA. Initial encounter. EXAM: MRI HEAD WITHOUT CONTRAST MRA HEAD WITHOUT CONTRAST TECHNIQUE: Multiplanar, multiecho pulse sequences of the brain and surrounding structures were obtained without intravenous contrast. Angiographic images of the head were obtained using MRA technique without contrast. COMPARISON:  Head CT without contrast 12/19/2015. Brain MRI  and intracranial MRA a 08/15/2008. FINDINGS: MRI HEAD FINDINGS Major intracranial vascular flow voids are stable since 2010. No restricted diffusion to suggest acute infarction. No midline shift, mass effect, evidence of mass lesion, ventriculomegaly, extra-axial collection or acute intracranial hemorrhage. Cervicomedullary junction and pituitary are within normal limits. Negative for age visualized cervical spine. Mild further generalized cerebral volume loss since 2010. Possible punctate chronic lacunar infarct in the medial left thalamus which is new since that time, but otherwise gray and white matter signal remains normal for age throughout the brain. No cortical encephalomalacia or definite chronic cerebral blood products. Visible internal auditory structures appear normal. Stable trace mastoid fluid, significance doubtful mild ethmoid and sphenoid sinus mucosal thickening which has increased. Postoperative changes to both globes now. Negative orbit and scalp soft tissues. Normal bone marrow signal. MRA HEAD FINDINGS Stable antegrade flow in the posterior circulation with codominant distal vertebral arteries. Normal right PICA origin and dominant appearing left AICA re- demonstrated. Normal vertebrobasilar junction. No basilar stenosis. Normal SCA and PCA origins. Posterior communicating arteries are diminutive or absent. Bilateral PCA branches are within normal limits. Stable antegrade flow in both ICA siphons. Mild cavernous and supraclinoid siphon irregularity in keeping with chronic atherosclerosis. No  hemodynamically significant siphon stenosis. Patent carotid termini. Normal MCA and ACA origins. The left A1 is dominant. Anterior communicating artery and visualized bilateral ACA branches are within normal limits. Left MCA M1 segments, bifurcations, and visualized bilateral MCA branches are within normal limits. IMPRESSION: 1. No acute intracranial abnormality and largely unremarkable for age noncontrast MRI appearance the brain. 2. Stable and negative intracranial MRA. Electronically Signed   By: Genevie Ann M.D.   On: 12/20/2015 11:06   US Carotid Bilateral  12/20/2015  CLINICAL DATA:  TIA EXAM: BILATERAL CAROTID DUPLEX ULTRASOUND TECHNIQUE: Pearline Cables scale imaging, color Doppler and duplex ultrasound were performed of bilateral carotid and vertebral arteries in the neck. COMPARISON:  None. FINDINGS: Criteria: Quantification of carotid stenosis is based on velocity parameters that correlate the residual internal carotid diameter with NASCET-based stenosis levels, using the diameter of the distal internal carotid lumen as the denominator for stenosis measurement. The following velocity measurements were obtained: RIGHT ICA:  99 cm/sec CCA:  86 cm/sec SYSTOLIC ICA/CCA RATIO:  1.2 DIASTOLIC ICA/CCA RATIO:  1.9 ECA:  118 cm/sec LEFT ICA:  78 cm/sec CCA:  98 cm/sec SYSTOLIC ICA/CCA RATIO:  0.8 DIASTOLIC ICA/CCA RATIO:  1.2 ECA:  105 cm/sec RIGHT CAROTID ARTERY: Minimal calcified plaque in the bulb. Low resistance internal carotid pattern. RIGHT VERTEBRAL ARTERY:  Antegrade. LEFT CAROTID ARTERY: Mild calcified plaque in the bulb. Low resistance Doppler pattern. LEFT VERTEBRAL ARTERY:  Antegrade. IMPRESSION: Less than 50% stenosis in the right and left internal carotid arteries. Electronically Signed   By: Marybelle Killings M.D.   On: 12/20/2015 10:54   Dg Chest Portable 1 View  12/19/2015  CLINICAL DATA:  Near syncopal episode 6 days ago. Persistent confusion for several days. Worsening double vision today. EXAM: PORTABLE  CHEST 1 VIEW COMPARISON:  11/04/2008 FINDINGS: Mild cardiomegaly stable. Aortic atherosclerosis noted. Both lungs are clear. No evidence of pneumothorax or pleural effusion. IMPRESSION: Mild cardiomegaly.  No active lung disease. Aortic atherosclerosis noted. Electronically Signed   By: Earle Gell M.D.   On: 12/19/2015 20:54   Mr Jodene Nam Head/brain Wo Cm  12/20/2015  CLINICAL DATA:  82 year old female with transient confusion and memory loss. Associated visual disturbances. Possible TIA. Initial encounter. EXAM: MRI HEAD WITHOUT CONTRAST MRA HEAD WITHOUT CONTRAST  TECHNIQUE: Multiplanar, multiecho pulse sequences of the brain and surrounding structures were obtained without intravenous contrast. Angiographic images of the head were obtained using MRA technique without contrast. COMPARISON:  Head CT without contrast 12/19/2015. Brain MRI and intracranial MRA a 08/15/2008. FINDINGS: MRI HEAD FINDINGS Major intracranial vascular flow voids are stable since 2010. No restricted diffusion to suggest acute infarction. No midline shift, mass effect, evidence of mass lesion, ventriculomegaly, extra-axial collection or acute intracranial hemorrhage. Cervicomedullary junction and pituitary are within normal limits. Negative for age visualized cervical spine. Mild further generalized cerebral volume loss since 2010. Possible punctate chronic lacunar infarct in the medial left thalamus which is new since that time, but otherwise gray and white matter signal remains normal for age throughout the brain. No cortical encephalomalacia or definite chronic cerebral blood products. Visible internal auditory structures appear normal. Stable trace mastoid fluid, significance doubtful mild ethmoid and sphenoid sinus mucosal thickening which has increased. Postoperative changes to both globes now. Negative orbit and scalp soft tissues. Normal bone marrow signal. MRA HEAD FINDINGS Stable antegrade flow in the posterior circulation with  codominant distal vertebral arteries. Normal right PICA origin and dominant appearing left AICA re- demonstrated. Normal vertebrobasilar junction. No basilar stenosis. Normal SCA and PCA origins. Posterior communicating arteries are diminutive or absent. Bilateral PCA branches are within normal limits. Stable antegrade flow in both ICA siphons. Mild cavernous and supraclinoid siphon irregularity in keeping with chronic atherosclerosis. No hemodynamically significant siphon stenosis. Patent carotid termini. Normal MCA and ACA origins. The left A1 is dominant. Anterior communicating artery and visualized bilateral ACA branches are within normal limits. Left MCA M1 segments, bifurcations, and visualized bilateral MCA branches are within normal limits. IMPRESSION: 1. No acute intracranial abnormality and largely unremarkable for age noncontrast MRI appearance the brain. 2. Stable and negative intracranial MRA. Electronically Signed   By: Genevie Ann M.D.   On: 12/20/2015 11:06    Assessment: 82 y.o. female presenting with episodes of diplopia.  Episodes very specific and monocular which makes a vascular etiology less likely.  Patient on ASA daily.  MRI of the brain personally reviewed and shows no acute changes.  MRA unremarkable as well.  Carotid dopplers show no evidence of hemodynamically significant stenosis.  Echocardiogram pending.  A1c pending, LDL 84.  Stroke Risk Factors - diabetes mellitus and hypertension  Plan: 1. Patient to continue ASA daily 2. Follow up with ophthalmology on an outpatient basis.     Alexis Goodell, MD Neurology [phone] 12/20/2015, 1:33 PM

## 2015-12-20 NOTE — Care Management (Addendum)
Admitted to [facility] with the diagnosis of TIA under observation status. Lives alone. Son is Edd Arbour [phone]). He lives across the street. Sees Dr. Kary Kos every 6 months. No home Health. No skilled facility. No home oxygen. No Life Alert, uses cell phone. Uses no aids for ambulation. Fell last week while mowing the yard [employer]). Good appetite. Prescriptions are filled at [facility] in [place]. Takes care of all basic and instrumental activities of daily living herself, drives. Shelbie Ammons RN MSN CCM Care management [phone]

## 2015-12-20 NOTE — Discharge Summary (Signed)
[facility] NAME: Erika Ochoa    MR#:  [id number]  DATE OF BIRTH:  03/07/1925  DATE OF ADMISSION:  12/19/2015 ADMITTING PHYSICIAN: Lance Coon, MD  DATE OF DISCHARGE: 12/20/15  PRIMARY CARE PHYSICIAN: [place] Pink, MD    ADMISSION DIAGNOSIS:  Transient cerebral ischemia, unspecified transient cerebral ischemia type [G45.9]  DISCHARGE DIAGNOSIS:  Principal Problem:   TIA (transient ischemic attack) Active Problems:   HLD (hyperlipidemia)   Diabetes ([place])   Essential (primary) hypertension   Adult hypothyroidism   CKD (chronic kidney disease), stage III   SECONDARY DIAGNOSIS:   Past Medical History  Diagnosis Date  . Hypertension   . Diabetes ([place])   . Breast screening, unspecified   . Mammographic microcalcification   . Abnormal mammogram, unspecified     HOSPITAL COURSE:   82 year old female with past medical history significant for hypertension, diabetes mellitus presents to hospital secondary to transient vision changes.  #1 TIA- with transient vision change, likely from her BP being elevated on presentation - BP back to normal - no symptoms now - on asa and statin - Neurology consulted - MRI and carotid dopplers and MRA without any acute findings  #2 hypertension-continue outpatient medications. On irbesartan and hydrochlorothiazide  #3 diabetes mellitus-follows with endocrinologist as outpatient. Continue metoprolol twice a day and trulicity once a week  #4 Hypothyroidism- TSH borderline elevated, follow with outpatient endocrinologist Cont low dose synthroid  No PT, OT needs. Will be discharged home.  DISCHARGE CONDITIONS:   Stable  CONSULTS OBTAINED:  Treatment Team:  Alexis Goodell, MD Catarina Hartshorn, MD  DRUG ALLERGIES:   Allergies  Allergen Reactions  . Oxycodone-Aspirin Other (See Comments)    Other Reaction: GI Upset  . Augmentin [Amoxicillin-Pot Clavulanate] Nausea Only   . Codeine Nausea Only  . Etodolac Other (See Comments)  . Iodine Nausea Only  . Levaquin [Levofloxacin In D5w] Nausea Only  . Levofloxacin Nausea Only  . Metronidazole Nausea Only  . Sulfa Antibiotics Nausea Only  . Ultram [Tramadol] Nausea Only    DISCHARGE MEDICATIONS:   Current Discharge Medication List    CONTINUE these medications which have NOT CHANGED   Details  allopurinol (ZYLOPRIM) 100 MG tablet Take 100 mg by mouth daily.     aspirin 81 MG tablet Take 81 mg by mouth daily.    Dulaglutide (TRULICITY) 1.5 0000000 SOPN Inject 1.5 mg into the skin every 7 (seven) days.    irbesartan-hydrochlorothiazide (AVALIDE) 300-12.5 MG per tablet Take 1 tablet by mouth daily.     levothyroxine (SYNTHROID, LEVOTHROID) 50 MCG tablet Take 50 mcg by mouth daily.     meloxicam (MOBIC) 15 MG tablet Take 15 mg by mouth daily.    metFORMIN (GLUCOPHAGE-XR) 500 MG 24 hr tablet Take 500 mg by mouth daily with breakfast.    metoprolol tartrate (LOPRESSOR) 25 MG tablet Take 25 mg by mouth 2 (two) times daily.    simvastatin (ZOCOR) 20 MG tablet Take 20 mg by mouth every evening.     Vitamin D, Ergocalciferol, (DRISDOL) 50000 UNITS CAPS capsule Take 50,000 Units by mouth every 7 (seven) days.  Refills: 99      STOP taking these medications     neomycin-polymyxin-hydrocortisone (CORTISPORIN) otic solution          DISCHARGE INSTRUCTIONS:   1. PCP f/u in 1 week   If you experience worsening of your admission symptoms, develop shortness of breath, life threatening emergency, suicidal or  homicidal thoughts you must seek medical attention immediately by calling 911 or calling your MD immediately  if symptoms less severe.  You Must read complete instructions/literature along with all the possible adverse reactions/side effects for all the Medicines you take and that have been prescribed to you. Take any new Medicines after you have completely understood and accept all the possible  adverse reactions/side effects.   Please note  You were cared for by a hospitalist during your hospital stay. If you have any questions about your discharge medications or the care you received while you were in the hospital after you are discharged, you can call the unit and asked to speak with the hospitalist on call if the hospitalist that took care of you is not available. Once you are discharged, your primary care physician will handle any further medical issues. Please note that NO REFILLS for any discharge medications will be authorized once you are discharged, as it is imperative that you return to your primary care physician (or establish a relationship with a primary care physician if you do not have one) for your aftercare needs so that they can reassess your need for medications and monitor your lab values.    Today   CHIEF COMPLAINT:   Chief Complaint  Patient presents with  . Eye Problem  . Altered Mental Status    VITAL SIGNS:  Blood pressure 131/72, pulse 71, temperature 97.6 F (36.4 C), temperature source Oral, resp. rate 20, height 5\' 5"  (1.651 m), weight 62.738 kg (138 lb 5 oz), SpO2 95 %.  I/O:   Intake/Output Summary (Last 24 hours) at 12/20/15 1304 Last data filed at 12/20/15 0930  Gross per 24 hour  Intake    120 ml  Output    400 ml  Net   -280 ml    PHYSICAL EXAMINATION:   Physical Exam  GENERAL:  82 y.o.-year-old patient lying in the bed with no acute distress.  EYES: Pupils equal, round, reactive to light and accommodation. No scleral icterus. Extraocular muscles intact.  HEENT: Head atraumatic, normocephalic. Oropharynx and nasopharynx clear.  NECK:  Supple, no jugular venous distention. No thyroid enlargement, no tenderness.  LUNGS: Normal breath sounds bilaterally, no wheezing, rales,rhonchi or crepitation. No use of accessory muscles of respiration.  CARDIOVASCULAR: S1, S2 normal. No murmurs, rubs, or gallops.  ABDOMEN: Soft, non-tender,  non-distended. Bowel sounds present. No organomegaly or mass.  EXTREMITIES: No pedal edema, cyanosis, or clubbing.  NEUROLOGIC: Cranial nerves II through XII are intact. Muscle strength 5/5 in all extremities. Sensation intact. Gait not checked. No vision deficits, no speech changes. PSYCHIATRIC: The patient is alert and oriented x 3.  SKIN: No obvious rash, lesion, or ulcer.   DATA REVIEW:   CBC  Recent Labs Lab 12/19/15 1851  WBC 11.0  HGB 13.0  HCT 38.4  PLT 271    Chemistries   Recent Labs Lab 12/19/15 1851  NA 138  K 3.9  CL 103  CO2 26  GLUCOSE 201*  BUN 32*  CREATININE 1.48*  CALCIUM 9.2  AST 28  ALT 25  ALKPHOS 68  BILITOT 0.9    Cardiac Enzymes  Recent Labs Lab 12/20/15 0447  TROPONINI <0.03    Microbiology Results  Results for orders placed or performed in visit on 03/02/12  Urine culture     Status: None   Collection Time: 03/02/12  3:13 PM  Result Value Ref Range Status   Micro Text Report   Final  SOURCE: CLEAN CATCH URINE    COMMENT                   MIXED BACTERIAL ORGANISMS   COMMENT                   RESULTS SUGGESTIVE OF CONTAMINATION   ANTIBIOTIC                                                        RADIOLOGY:  Ct Head Wo Contrast  12/19/2015  CLINICAL DATA:  82 year old female with transient ischemic attack and altered mental status. EXAM: CT HEAD WITHOUT CONTRAST TECHNIQUE: Contiguous axial images were obtained from the base of the skull through the vertex without intravenous contrast. COMPARISON:  Head CT dated 02/11/2012 FINDINGS: There is moderate age-related atrophy and chronic microvascular ischemic changes. There is no acute intracranial hemorrhage. No mass effect or midline shift. The visualized paranasal sinuses and mastoid air cells are clear. The calvarium is intact. IMPRESSION: No acute intracranial hemorrhage. Age-related atrophy and chronic microvascular ischemic disease. If symptoms persist and there are no  contraindications, MRI may provide better evaluation if clinically indicated. Electronically Signed   By: Anner Crete M.D.   On: 12/19/2015 20:37   Mr Brain Wo Contrast  12/20/2015  CLINICAL DATA:  82 year old female with transient confusion and memory loss. Associated visual disturbances. Possible TIA. Initial encounter. EXAM: MRI HEAD WITHOUT CONTRAST MRA HEAD WITHOUT CONTRAST TECHNIQUE: Multiplanar, multiecho pulse sequences of the brain and surrounding structures were obtained without intravenous contrast. Angiographic images of the head were obtained using MRA technique without contrast. COMPARISON:  Head CT without contrast 12/19/2015. Brain MRI and intracranial MRA a 08/15/2008. FINDINGS: MRI HEAD FINDINGS Major intracranial vascular flow voids are stable since 2010. No restricted diffusion to suggest acute infarction. No midline shift, mass effect, evidence of mass lesion, ventriculomegaly, extra-axial collection or acute intracranial hemorrhage. Cervicomedullary junction and pituitary are within normal limits. Negative for age visualized cervical spine. Mild further generalized cerebral volume loss since 2010. Possible punctate chronic lacunar infarct in the medial left thalamus which is new since that time, but otherwise gray and white matter signal remains normal for age throughout the brain. No cortical encephalomalacia or definite chronic cerebral blood products. Visible internal auditory structures appear normal. Stable trace mastoid fluid, significance doubtful mild ethmoid and sphenoid sinus mucosal thickening which has increased. Postoperative changes to both globes now. Negative orbit and scalp soft tissues. Normal bone marrow signal. MRA HEAD FINDINGS Stable antegrade flow in the posterior circulation with codominant distal vertebral arteries. Normal right PICA origin and dominant appearing left AICA re- demonstrated. Normal vertebrobasilar junction. No basilar stenosis. Normal SCA and PCA  origins. Posterior communicating arteries are diminutive or absent. Bilateral PCA branches are within normal limits. Stable antegrade flow in both ICA siphons. Mild cavernous and supraclinoid siphon irregularity in keeping with chronic atherosclerosis. No hemodynamically significant siphon stenosis. Patent carotid termini. Normal MCA and ACA origins. The left A1 is dominant. Anterior communicating artery and visualized bilateral ACA branches are within normal limits. Left MCA M1 segments, bifurcations, and visualized bilateral MCA branches are within normal limits. IMPRESSION: 1. No acute intracranial abnormality and largely unremarkable for age noncontrast MRI appearance the brain. 2. Stable and negative intracranial MRA. Electronically Signed   By: Genevie Ann  M.D.   On: 12/20/2015 11:06   US Carotid Bilateral  12/20/2015  CLINICAL DATA:  TIA EXAM: BILATERAL CAROTID DUPLEX ULTRASOUND TECHNIQUE: Pearline Cables scale imaging, color Doppler and duplex ultrasound were performed of bilateral carotid and vertebral arteries in the neck. COMPARISON:  None. FINDINGS: Criteria: Quantification of carotid stenosis is based on velocity parameters that correlate the residual internal carotid diameter with NASCET-based stenosis levels, using the diameter of the distal internal carotid lumen as the denominator for stenosis measurement. The following velocity measurements were obtained: RIGHT ICA:  99 cm/sec CCA:  86 cm/sec SYSTOLIC ICA/CCA RATIO:  1.2 DIASTOLIC ICA/CCA RATIO:  1.9 ECA:  118 cm/sec LEFT ICA:  78 cm/sec CCA:  98 cm/sec SYSTOLIC ICA/CCA RATIO:  0.8 DIASTOLIC ICA/CCA RATIO:  1.2 ECA:  105 cm/sec RIGHT CAROTID ARTERY: Minimal calcified plaque in the bulb. Low resistance internal carotid pattern. RIGHT VERTEBRAL ARTERY:  Antegrade. LEFT CAROTID ARTERY: Mild calcified plaque in the bulb. Low resistance Doppler pattern. LEFT VERTEBRAL ARTERY:  Antegrade. IMPRESSION: Less than 50% stenosis in the right and left internal carotid  arteries. Electronically Signed   By: Marybelle Killings M.D.   On: 12/20/2015 10:54   Dg Chest Portable 1 View  12/19/2015  CLINICAL DATA:  Near syncopal episode 6 days ago. Persistent confusion for several days. Worsening double vision today. EXAM: PORTABLE CHEST 1 VIEW COMPARISON:  11/04/2008 FINDINGS: Mild cardiomegaly stable. Aortic atherosclerosis noted. Both lungs are clear. No evidence of pneumothorax or pleural effusion. IMPRESSION: Mild cardiomegaly.  No active lung disease. Aortic atherosclerosis noted. Electronically Signed   By: Earle Gell M.D.   On: 12/19/2015 20:54   Mr Jodene Nam Head/brain Wo Cm  12/20/2015  CLINICAL DATA:  82 year old female with transient confusion and memory loss. Associated visual disturbances. Possible TIA. Initial encounter. EXAM: MRI HEAD WITHOUT CONTRAST MRA HEAD WITHOUT CONTRAST TECHNIQUE: Multiplanar, multiecho pulse sequences of the brain and surrounding structures were obtained without intravenous contrast. Angiographic images of the head were obtained using MRA technique without contrast. COMPARISON:  Head CT without contrast 12/19/2015. Brain MRI and intracranial MRA a 08/15/2008. FINDINGS: MRI HEAD FINDINGS Major intracranial vascular flow voids are stable since 2010. No restricted diffusion to suggest acute infarction. No midline shift, mass effect, evidence of mass lesion, ventriculomegaly, extra-axial collection or acute intracranial hemorrhage. Cervicomedullary junction and pituitary are within normal limits. Negative for age visualized cervical spine. Mild further generalized cerebral volume loss since 2010. Possible punctate chronic lacunar infarct in the medial left thalamus which is new since that time, but otherwise gray and white matter signal remains normal for age throughout the brain. No cortical encephalomalacia or definite chronic cerebral blood products. Visible internal auditory structures appear normal. Stable trace mastoid fluid, significance doubtful  mild ethmoid and sphenoid sinus mucosal thickening which has increased. Postoperative changes to both globes now. Negative orbit and scalp soft tissues. Normal bone marrow signal. MRA HEAD FINDINGS Stable antegrade flow in the posterior circulation with codominant distal vertebral arteries. Normal right PICA origin and dominant appearing left AICA re- demonstrated. Normal vertebrobasilar junction. No basilar stenosis. Normal SCA and PCA origins. Posterior communicating arteries are diminutive or absent. Bilateral PCA branches are within normal limits. Stable antegrade flow in both ICA siphons. Mild cavernous and supraclinoid siphon irregularity in keeping with chronic atherosclerosis. No hemodynamically significant siphon stenosis. Patent carotid termini. Normal MCA and ACA origins. The left A1 is dominant. Anterior communicating artery and visualized bilateral ACA branches are within normal limits. Left MCA M1 segments, bifurcations, and visualized  bilateral MCA branches are within normal limits. IMPRESSION: 1. No acute intracranial abnormality and largely unremarkable for age noncontrast MRI appearance the brain. 2. Stable and negative intracranial MRA. Electronically Signed   By: Genevie Ann M.D.   On: 12/20/2015 11:06    EKG:   Orders placed or performed during the hospital encounter of 12/19/15  . EKG 12-Lead  . EKG 12-Lead      Management plans discussed with the patient, family and they are in agreement.  CODE STATUS:     Code Status Orders        Start     Ordered   12/19/15 2333  Full code   Continuous     12/19/15 2332    Code Status History    Date Active Date Inactive Code Status Order ID Comments User Context   This patient has a current code status but no historical code status.    Advance Directive Documentation        Most Recent Value   Type of Advance Directive  Living will, Healthcare Power of Attorney   Pre-existing out of facility DNR order (yellow form or pink MOST  form)     "MOST" Form in Place?        TOTAL TIME TAKING CARE OF THIS PATIENT: 37 minutes.    Gladstone Lighter M.D on 12/20/2015 at 1:04 PM  Between 7am to 6pm - Pager - [phone]  After 6pm go to [email or web address] - password [email or web address] [facility]  [facility]  Office  [phone]  CC: Primary care physician; [place] Pink, MD

## 2015-12-20 NOTE — Discharge Instructions (Signed)
Transient Ischemic Attack °A transient ischemic attack (TIA) is a "warning stroke" that causes stroke-like symptoms. A TIA does not cause lasting damage to the brain. The symptoms of a TIA can happen fast and do not last long. It is important to know the symptoms of a TIA and what to do. This can help prevent stroke or death.  °HOME CARE  °· Take medicines only as told by your doctor. Make sure you understand all of the instructions. °· You may need to take aspirin or warfarin medicine. Warfarin needs to be taken exactly as told. °¨ Taking too much or too little warfarin is dangerous. Blood tests must be done as often as told by your doctor. A PT blood test measures how long it takes for blood to clot. Your PT is used to calculate another value called an INR. Your PT and INR help your doctor adjust your warfarin dosage. He or she will make sure you are taking the right amount. °¨ Food can cause problems with warfarin and affect the results of your blood tests. This is true for foods high in vitamin K. Eat the same amount of foods high in vitamin K each day. Foods high in vitamin K include spinach, kale, broccoli, cabbage, collard and turnip greens, Brussels sprouts, peas, cauliflower, seaweed, and parsley. Other foods high in vitamin K include beef and pork liver, green tea, and soybean oil. Eat the same amount of foods high in vitamin K each day. Avoid big changes in your diet. Tell your doctor before changing your diet. Talk to a food specialist (dietitian) if you have questions. °¨ Many medicines can cause problems with warfarin and affect your PT and INR. Tell your doctor about all medicines you take. This includes vitamins and dietary pills (supplements). Do not take or stop taking any prescribed or over-the-counter medicines unless your doctor tells you to. °¨ Warfarin can cause more bruising or bleeding. Hold pressure over any cuts for longer than normal. Talk to your doctor about other side effects of  warfarin. °¨ Avoid sports or activities that may cause injury or bleeding. °¨ Be careful when you shave, floss, or use sharp objects. °¨ Avoid or drink very little alcohol while taking warfarin. Tell your doctor if you change how much alcohol you drink. °¨ Tell your dentist and other doctors that you take warfarin before any procedures. °· Follow your diet program as told, if you are given one. °· Keep a healthy weight. °· Stay active. Try to get at least 30 minutes of activity on all or most days. °· Do not use any tobacco products, including cigarettes, chewing tobacco, or electronic cigarettes. If you need help quitting, ask your doctor. °· Limit alcohol intake to no more than 1 drink per day for nonpregnant women and 2 drinks per day for men. One drink equals 12 ounces of beer, 5 ounces of wine, or 1½ ounces of hard liquor. °· Do not abuse drugs. °· Keep your home safe so you do not fall. You can do this by: °¨ Putting grab bars in the bedroom and bathroom. °¨ Raising toilet seats. °¨ Putting a seat in the shower. °· Keep all follow-up visits as told by your doctor. This is important. °GET HELP IF: °· Your personality changes. °· You have trouble swallowing. °· You have double vision. °· You are dizzy. °· You have a fever. °GET HELP RIGHT AWAY IF:  °These symptoms may be an emergency. Do not wait to see if the   symptoms will go away. Get medical help right away. Call your local emergency services (911 in the [place] Do not drive yourself to the hospital.  You have sudden weakness or lose feeling (go numb), especially on one side of the body. This can affect your:  Face.  Arm.  Leg.  You have sudden trouble walking.  You have sudden trouble moving your arms or legs.  You have sudden confusion.  You have trouble talking.  You have trouble understanding.  You have sudden trouble seeing in one or both eyes.  You lose your balance.  Your movements are not smooth.  You have a sudden, very bad  headache with no known cause.  You have new chest pain.  Your heartbeat is unsteady.  You are partly or totally unaware of what is going on around you. MAKE SURE YOU:   Understand these instructions.  Will watch your condition.  Will get help right away if you are not doing well or get worse.   This information is not intended to replace advice given to you by your health care provider. Make sure you discuss any questions you have with your health care provider.   Document Released: 03/03/2008 Document Revised: 06/15/2014 Document Reviewed: 08/30/2013 Elsevier Interactive Patient Education 2016 [employer].  Stroke Prevention Some medical conditions and behaviors are associated with an increased chance of having a stroke. You may prevent a stroke by making healthy choices and managing medical conditions. HOW CAN I REDUCE MY RISK OF HAVING A STROKE?   Stay physically active. Get at least 30 minutes of activity on most or all days.  Do not smoke. It may also be helpful to avoid exposure to secondhand smoke.  Limit alcohol use. Moderate alcohol use is considered to be:  No more than 2 drinks per day for men.  No more than 1 drink per day for nonpregnant women.  Eat healthy foods. This involves:  Eating 5 or more servings of fruits and vegetables a day.  Making dietary changes that address high blood pressure (hypertension), high cholesterol, diabetes, or obesity.  Manage your cholesterol levels.  Making food choices that are high in fiber and low in saturated fat, trans fat, and cholesterol may control cholesterol levels.  Take any prescribed medicines to control cholesterol as directed by your health care provider.  Manage your diabetes.  Controlling your carbohydrate and sugar intake is recommended to manage diabetes.  Take any prescribed medicines to control diabetes as directed by your health care provider.  Control your hypertension.  Making food choices that  are low in salt (sodium), saturated fat, trans fat, and cholesterol is recommended to manage hypertension.  Ask your health care provider if you need treatment to lower your blood pressure. Take any prescribed medicines to control hypertension as directed by your health care provider.  If you are 29-69 years of age, have your blood pressure checked every 3-5 years. If you are 29 years of age or older, have your blood pressure checked every year.  Maintain a healthy weight.  Reducing calorie intake and making food choices that are low in sodium, saturated fat, trans fat, and cholesterol are recommended to manage weight.  Stop drug abuse.  Avoid taking birth control pills.  Talk to your health care provider about the risks of taking birth control pills if you are over 54 years old, smoke, get migraines, or have ever had a blood clot.  Get evaluated for sleep disorders (sleep apnea).  Talk to  your health care provider about getting a sleep evaluation if you snore a lot or have excessive sleepiness.  Take medicines only as directed by your health care provider.  For some people, aspirin or blood thinners (anticoagulants) are helpful in reducing the risk of forming abnormal blood clots that can lead to stroke. If you have the irregular heart rhythm of atrial fibrillation, you should be on a blood thinner unless there is a good reason you cannot take them.  Understand all your medicine instructions.  Make sure that other conditions (such as anemia or atherosclerosis) are addressed. SEEK IMMEDIATE MEDICAL CARE IF:   You have sudden weakness or numbness of the face, arm, or leg, especially on one side of the body.  Your face or eyelid droops to one side.  You have sudden confusion.  You have trouble speaking (aphasia) or understanding.  You have sudden trouble seeing in one or both eyes.  You have sudden trouble walking.  You have dizziness.  You have a loss of balance or  coordination.  You have a sudden, severe headache with no known cause.  You have new chest pain or an irregular heartbeat. Any of these symptoms may represent a serious problem that is an emergency. Do not wait to see if the symptoms will go away. Get medical help at once. Call your local emergency services (911 in [place] Do not drive yourself to the hospital.   This information is not intended to replace advice given to you by your health care provider. Make sure you discuss any questions you have with your health care provider.   Document Released: 07/02/2004 Document Revised: 06/15/2014 Document Reviewed: 11/25/2012 Elsevier Interactive Patient Education [employer].

## 2015-12-20 NOTE — Evaluation (Signed)
Physical Therapy Evaluation Patient Details Name: TENITA ORTH MRN: [id number] DOB: 09-Feb-1925 Today's Date: 12/20/2015   History of Present Illness  Melenie Fury is a 82 y.o. female who presents with an episode concerning for possible TIA. Patient states that 24 hours prior to presentation the ED she was in a grocery store when she came out she did not recognize where she was. This was transient and lasted only a few moments until she did finally recognize her car. However, than when she was driving home she had some difficulty to describe visual disturbance involving the stoplights where she feels like she saw stoplights even after she had passed through the intersection. This was also transient lasted only a few moments.  Clinical Impression  Pt alert and oriented x 4, pleasant, and motivated to return home. Pt's PLOF includes active community ambulator without AD who reports walking for exercise for 20-30 min 4-5x/wk.  Pt presents this date with independence in all functional mobility and gait without AD.  Pt reports feeling back to baseline at this time and has been cleared medically for discharge home later today.      Follow Up Recommendations No PT follow up    Equipment Recommendations  None recommended by PT    Recommendations for Other Services       Precautions / Restrictions Precautions Precautions: None Restrictions Weight Bearing Restrictions: No      Mobility  Bed Mobility Overal bed mobility: Independent                Transfers Overall transfer level: Independent Equipment used: None                Ambulation/Gait Ambulation/Gait assistance: Independent Ambulation Distance (Feet): 200 Feet Assistive device: None Gait Pattern/deviations: WFL(Within Functional Limits)     General Gait Details: Pt steady with gait without AD with no LOB  Stairs            Wheelchair Mobility    Modified Rankin (Stroke Patients Only)       Balance  Overall balance assessment: Independent                                           Pertinent Vitals/Pain Pain Assessment: 0-10 Pain Score: 0-No pain    Home Living Family/patient expects to be discharged to:: Private residence Living Arrangements: Alone Available Help at Discharge: Family Type of Home: House Home Access: Other (comment) (One small step/threshold into home)     Home Layout: Two level (Pt does not access upper level, all needs are on 1st floor) Home Equipment: None      Prior Function Level of Independence: Independent         Comments: Pt independent with amb community distances without AD, Ind with ADLs, still drives; one fall in last year while mowing yard with a self-propelled push mower     Hand Dominance        Extremity/Trunk Assessment   Upper Extremity Assessment: Overall WFL for tasks assessed           Lower Extremity Assessment: Overall WFL for tasks assessed         Communication   Communication: No difficulties  Cognition Arousal/Alertness: Awake/alert Behavior During Therapy: WFL for tasks assessed/performed Overall Cognitive Status: Within Functional Limits for tasks assessed  General Comments      Exercises        Assessment/Plan    PT Assessment Patent does not need any further PT services  PT Diagnosis Generalized weakness   PT Problem List    PT Treatment Interventions     PT Goals (Current goals can be found in the Care Plan section) Acute Rehab PT Goals Patient Stated Goal: To go home PT Goal Formulation: With patient    Frequency     Barriers to discharge        Co-evaluation               End of Session Equipment Utilized During Treatment: Gait belt Activity Tolerance: Patient tolerated treatment well Patient left: in chair      Functional Assessment Tool Used: Clinical judgement Functional Limitation: Mobility: Walking and moving  around Mobility: Walking and Moving Around Current Status [phone]): 0 percent impaired, limited or restricted Mobility: Walking and Moving Around Goal Status [phone]): 0 percent impaired, limited or restricted Mobility: Walking and Moving Around Discharge Status [phone]): 0 percent impaired, limited or restricted    Time: 1340-1410 PT Time Calculation (min) (ACUTE ONLY): 30 min   Charges:   PT Evaluation $PT Eval Low Complexity: 1 Procedure     PT G Codes:   PT G-Codes **NOT FOR INPATIENT CLASS** Functional Assessment Tool Used: Clinical judgement Functional Limitation: Mobility: Walking and moving around Mobility: Walking and Moving Around Current Status [id number]): 0 percent impaired, limited or restricted Mobility: Walking and Moving Around Goal Status [id number]): 0 percent impaired, limited or restricted Mobility: Walking and Moving Around Discharge Status [id number]): 0 percent impaired, limited or restricted    Cy Blamer, DPT 12/20/2015, 2:44 PM

## 2015-12-20 NOTE — Progress Notes (Signed)
*  PRELIMINARY RESULTS* Echocardiogram 2D Echocardiogram has been performed.  Sherrie Sport 12/20/2015, 11:41 AM

## 2015-12-20 NOTE — Progress Notes (Signed)
Patient discharged home per MD order. All discharge instructions given and all questions answered. Stroke handbook given and all questions answered.

## 2015-12-20 NOTE — Care Management Obs Status (Signed)
[place] NOTIFICATION   Patient Details  Name: Erika Ochoa MRN: [id number] Date of Birth: 08-Sep-1924   Medicare Observation Status Notification Given:  Yes    Shelbie Ammons, RN 12/20/2015, 8:30 AM

## 2015-12-20 NOTE — Progress Notes (Signed)
OT Cancellation Note  Patient Details Name: Erika Ochoa MRN: [id number] DOB: [birth year]   Cancelled Treatment:    Reason Eval/Treat Not Completed: Patient at procedure or test/ unavailable. Pt at MRI and not available for OT evaluation.  Will attempt again later today or tomorrow.  Chrys Racer, OTR/L ascom [phone] 12/20/2015, 11:18 AM

## 2016-02-05 DIAGNOSIS — Z8673 Personal history of transient ischemic attack (TIA), and cerebral infarction without residual deficits: Secondary | ICD-10-CM | POA: Insufficient documentation

## 2016-04-08 ENCOUNTER — Other Ambulatory Visit: Payer: Self-pay | Admitting: Family Medicine

## 2016-04-08 DIAGNOSIS — Z1231 Encounter for screening mammogram for malignant neoplasm of breast: Secondary | ICD-10-CM

## 2016-05-20 ENCOUNTER — Ambulatory Visit
Admission: RE | Admit: 2016-05-20 | Discharge: 2016-05-20 | Disposition: A | Discharge status: Home / Self Care | Payer: Medicare Other | Source: Ambulatory Visit | Attending: Family Medicine | Admitting: Family Medicine

## 2016-05-20 DIAGNOSIS — Z1231 Encounter for screening mammogram for malignant neoplasm of breast: Secondary | ICD-10-CM | POA: Diagnosis not present

## 2016-08-12 ENCOUNTER — Ambulatory Visit (INDEPENDENT_AMBULATORY_CARE_PROVIDER_SITE_OTHER): Payer: Medicare Other | Admitting: Obstetrics & Gynecology

## 2016-08-12 VITALS — BP 140/80 | HR 73 | Ht 66.0 in | Wt 144.0 lb

## 2016-08-12 DIAGNOSIS — N8111 Cystocele, midline: Secondary | ICD-10-CM | POA: Insufficient documentation

## 2016-08-12 DIAGNOSIS — N812 Incomplete uterovaginal prolapse: Secondary | ICD-10-CM

## 2016-08-12 NOTE — Progress Notes (Signed)
HPI:      [name] is a 83 y.o. G2P0011 who presents today for her pessary follow up and examination related to her pelvic floor weakening.  Pt reports tolerating the pessary well with  no vaginal bleeding and  no vaginal discharge.  Symptoms of pelvic floor weakening have greatly improved. She is voiding and defecating without difficulty. She currently has a #4 ring pessary.  PMHx: The following portions of the patient's history were reviewed and updated as appropriate:           She  has a past medical history of Abnormal mammogram, unspecified; Breast screening, unspecified; Diabetes ([place]); Hypertension; Mammographic microcalcification; and Vaginal pessary present. She  has a past surgical history that includes Skin lesion excision; Breast excisional biopsy (Left); and Breast biopsy (Left, 2013). Her family history includes Cancer in her mother. She  reports that she has never smoked. She does not have any smokeless tobacco history on file. She reports that she does not drink alcohol or use drugs.  Review of Systems  Constitutional: Negative for chills, fever and malaise/fatigue.  HENT: Negative for congestion, sinus pain and sore throat.   Eyes: Negative for blurred vision and pain.  Respiratory: Negative for cough and wheezing.   Cardiovascular: Negative for chest pain and leg swelling.  Gastrointestinal: Negative for abdominal pain, constipation, diarrhea, heartburn, nausea and vomiting.  Genitourinary: Negative for dysuria, frequency, hematuria and urgency.  Musculoskeletal: Negative for back pain, joint pain, myalgias and neck pain.  Skin: Negative for itching and rash.  Neurological: Negative for dizziness, tremors and weakness.  Endo/Heme/Allergies: Does not bruise/bleed easily.  Psychiatric/Behavioral: Negative for depression. The patient is not nervous/anxious and does not have insomnia.     Physical Exam  Constitutional: She is oriented to person, place, and time. She  appears well-developed and well-nourished. No distress.  Genitourinary: Rectum normal and vagina normal. Pelvic exam was performed with patient supine. There is no rash or lesion on the right labia. There is no rash or lesion on the left labia. Vagina exhibits no lesion. No bleeding in the vagina.  Cardiovascular: Normal rate.   Pulmonary/Chest: Effort normal.  Abdominal: Soft. Bowel sounds are normal. She exhibits no distension. There is no tenderness. There is no rebound.  Musculoskeletal: Normal range of motion.  Neurological: She is alert and oriented to person, place, and time.  Skin: Skin is warm and dry.  Psychiatric: She has a normal mood and affect.  Vitals reviewed.   Pessary Care Pessary removed and cleaned.  Vagina checked - without erosions - pessary replaced.  A/P:  Pessary was cleaned and replaced today. Instructions given for care. Concerning symptoms to observe for are counseled to patient. Follow up scheduled for 3 months.  Barnett Applebaum, MD, [facility], [facility] 08/12/2016  10:41 AM

## 2016-11-16 ENCOUNTER — Ambulatory Visit (INDEPENDENT_AMBULATORY_CARE_PROVIDER_SITE_OTHER): Payer: Medicare Other | Admitting: Obstetrics & Gynecology

## 2016-11-16 ENCOUNTER — Encounter: Payer: Self-pay | Admitting: Obstetrics & Gynecology

## 2016-11-16 VITALS — BP 138/70 | HR 74 | Wt 143.0 lb

## 2016-11-16 DIAGNOSIS — N812 Incomplete uterovaginal prolapse: Secondary | ICD-10-CM | POA: Diagnosis not present

## 2016-11-16 DIAGNOSIS — N8111 Cystocele, midline: Secondary | ICD-10-CM

## 2016-11-16 NOTE — Progress Notes (Signed)
  HPI:      [name] is a 83 y.o. G2P0011 who presents today for her pessary follow up and examination related to her pelvic floor weakening.  Pt reports tolerating the pessary well with  no vaginal bleeding and  no vaginal discharge.  Symptoms of pelvic floor weakening have greatly improved. She is voiding and defecating without difficulty. She currently has a #4 ring w support pessary.  PMHx: She  has a past medical history of Abnormal mammogram, unspecified; Breast screening, unspecified; Diabetes ([place]); Hypertension; Mammographic microcalcification; and Vaginal pessary present. Also,  has a past surgical history that includes Skin lesion excision; Breast excisional biopsy (Left); and Breast biopsy (Left, 2013)., family history includes Cancer in her mother.,  reports that she has never smoked. She has never used smokeless tobacco. She reports that she does not drink alcohol or use drugs.  She has a current medication list which includes the following prescription(s): allopurinol, aspirin, cyanocobalamin, dulaglutide, irbesartan-hydrochlorothiazide, levothyroxine, meloxicam, metformin, metoprolol tartrate, simvastatin, and vitamin d (ergocalciferol). Also, is allergic to oxycodone-aspirin; augmentin [amoxicillin-pot clavulanate]; codeine; etodolac; iodine; levaquin [levofloxacin in d5w]; levofloxacin; metronidazole; sulfa antibiotics; and ultram [tramadol].  Review of Systems  All other systems reviewed and are negative.   Objective: BP 138/70 (BP Location: Right Arm, Patient Position: Sitting, Cuff Size: Normal)   Pulse 74   Wt 143 lb (64.9 kg)   BMI 23.08 kg/m  Physical Exam  Constitutional: She is oriented to person, place, and time. She appears well-developed and well-nourished. No distress.  Genitourinary: Vagina normal and uterus normal. Pelvic exam was performed with patient supine. There is no rash, tenderness or lesion on the right labia. There is no rash, tenderness or  lesion on the left labia. No erythema or bleeding in the vagina. Left adnexum does not display mass. Cervix does not exhibit motion tenderness, discharge, polyp or nabothian cyst.   Uterus is mobile and midaxial. Uterus is not enlarged or exhibiting a mass.  Genitourinary Comments: Atrophy present  Abdominal: Soft. She exhibits no distension. There is no tenderness.  Musculoskeletal: Normal range of motion.  Neurological: She is alert and oriented to person, place, and time. No cranial nerve deficit.  Skin: Skin is warm and dry.  Psychiatric: She has a normal mood and affect.    Pessary Care Pessary removed and cleaned.  Vagina checked - without erosions - pessary replaced.  A/P:  Pessary was cleaned and replaced today. Instructions given for care. Concerning symptoms to observe for are counseled to patient. Follow up scheduled for 3 months.  Barnett Applebaum, MD, [facility], [facility] 11/16/2016  10:39 AM

## 2017-02-15 ENCOUNTER — Ambulatory Visit: Payer: Self-pay | Admitting: Obstetrics & Gynecology

## 2017-02-16 ENCOUNTER — Ambulatory Visit: Payer: Self-pay | Admitting: Obstetrics & Gynecology

## 2017-02-19 ENCOUNTER — Ambulatory Visit: Payer: Self-pay | Admitting: Obstetrics & Gynecology

## 2017-02-22 ENCOUNTER — Ambulatory Visit (INDEPENDENT_AMBULATORY_CARE_PROVIDER_SITE_OTHER): Payer: Medicare Other | Admitting: Obstetrics & Gynecology

## 2017-02-22 ENCOUNTER — Encounter: Payer: Self-pay | Admitting: Obstetrics & Gynecology

## 2017-02-22 VITALS — BP 130/80 | HR 82 | Ht 66.0 in | Wt 145.0 lb

## 2017-02-22 DIAGNOSIS — N8111 Cystocele, midline: Secondary | ICD-10-CM | POA: Diagnosis not present

## 2017-02-22 DIAGNOSIS — N812 Incomplete uterovaginal prolapse: Secondary | ICD-10-CM | POA: Diagnosis not present

## 2017-02-22 NOTE — Progress Notes (Signed)
  HPI:      Ms. GENNELL HOW is a 83 y.o. G2P0011 who presents today for her pessary follow up and examination related to her pelvic floor weakening.  Pt reports tolerating the pessary well with  no vaginal bleeding and  no vaginal discharge.  Symptoms of pelvic floor weakening have greatly improved. She is voiding and defecating without difficulty. She currently has a RING #4 pessary.  PMHx: She  has a past medical history of Abnormal mammogram, unspecified; Breast screening, unspecified; Diabetes ([place]); Hypertension; Mammographic microcalcification; and Vaginal pessary present. Also,  has a past surgical history that includes Skin lesion excision; Breast excisional biopsy (Left); and Breast biopsy (Left, 2013)., family history includes Cancer in her mother.,  reports that she has never smoked. She has never used smokeless tobacco. She reports that she does not drink alcohol or use drugs.  She has a current medication list which includes the following prescription(s): allopurinol, aspirin, cyanocobalamin, dulaglutide, irbesartan-hydrochlorothiazide, levothyroxine, meloxicam, metformin, metoprolol tartrate, simvastatin, and vitamin d (ergocalciferol). Also, is allergic to oxycodone-aspirin; augmentin [amoxicillin-pot clavulanate]; codeine; etodolac; iodine; levaquin [levofloxacin in d5w]; levofloxacin; metronidazole; sulfa antibiotics; and ultram [tramadol].  Review of Systems  All other systems reviewed and are negative.  Objective: BP 130/80   Pulse 82   Ht 5\' 6"  (1.676 m)   Wt 145 lb (65.8 kg)   BMI 23.40 kg/m  Physical Exam  Constitutional: She is oriented to person, place, and time. She appears well-developed and well-nourished. No distress.  Genitourinary: Vagina normal and uterus normal. Pelvic exam was performed with patient supine. There is no rash, tenderness or lesion on the right labia. There is no rash, tenderness or lesion on the left labia. No erythema or bleeding in the vagina.  Right adnexum does not display mass and does not display tenderness. Left adnexum does not display mass and does not display tenderness. Cervix does not exhibit motion tenderness, discharge, polyp or nabothian cyst.   Uterus is mobile and midaxial. Uterus is not enlarged or exhibiting a mass.  Genitourinary Comments: Atrophy mod. Cystocele 2+, uterine prolapse 2+ without pessary  Abdominal: Soft. She exhibits no distension. There is no tenderness.  Musculoskeletal: Normal range of motion.  Neurological: She is alert and oriented to person, place, and time. No cranial nerve deficit.  Skin: Skin is warm and dry.  Psychiatric: She has a normal mood and affect.   Pessary Care Pessary removed and cleaned.  Vagina checked - without erosions - pessary replaced.  A/P: Pessary was cleaned and replaced today. Instructions given for care. Concerning symptoms to observe for are counseled to patient. Follow up scheduled for 3 months.   A total of 15 minutes were spent face-to-face with the patient during this encounter and over half of that time dealt with counseling and coordination of care.  Barnett Applebaum, MD, [facility], [facility] 02/22/2017  2:10 PM

## 2017-04-08 ENCOUNTER — Other Ambulatory Visit: Payer: Self-pay | Admitting: Family Medicine

## 2017-04-08 DIAGNOSIS — Z1231 Encounter for screening mammogram for malignant neoplasm of breast: Secondary | ICD-10-CM

## 2017-05-21 ENCOUNTER — Ambulatory Visit
Admission: RE | Admit: 2017-05-21 | Discharge: 2017-05-21 | Disposition: A | Discharge status: Home / Self Care | Payer: Medicare Other | Source: Ambulatory Visit | Attending: Family Medicine | Admitting: Family Medicine

## 2017-05-21 DIAGNOSIS — Z1231 Encounter for screening mammogram for malignant neoplasm of breast: Secondary | ICD-10-CM

## 2017-05-24 ENCOUNTER — Encounter: Payer: Self-pay | Admitting: Obstetrics & Gynecology

## 2017-05-24 ENCOUNTER — Ambulatory Visit: Payer: Medicare Other | Admitting: Obstetrics & Gynecology

## 2017-05-24 VITALS — BP 138/80 | HR 73 | Ht 66.0 in | Wt 146.0 lb

## 2017-05-24 DIAGNOSIS — N8111 Cystocele, midline: Secondary | ICD-10-CM | POA: Diagnosis not present

## 2017-05-24 DIAGNOSIS — N812 Incomplete uterovaginal prolapse: Secondary | ICD-10-CM

## 2017-05-24 NOTE — Progress Notes (Signed)
  HPI:      [name] is a 83 y.o. G2P0011 who presents today for her pessary follow up and examination related to her pelvic floor weakening.  Pt reports tolerating the pessary well with  no vaginal bleeding and  no vaginal discharge.  Symptoms of pelvic floor weakening have greatly improved. She is voiding and defecating without difficulty. She currently has a RING #4 pessary.  PMHx: She  has a past medical history of Abnormal mammogram, unspecified, Breast screening, unspecified, Diabetes ([place]), Hypertension, Mammographic microcalcification, and Vaginal pessary present. Also,  has a past surgical history that includes Skin lesion excision; Breast excisional biopsy (Left); and Breast biopsy (Left, 2013)., family history includes Cancer in her mother.,  reports that  has never smoked. she has never used smokeless tobacco. She reports that she does not drink alcohol or use drugs.  She has a current medication list which includes the following prescription(s): allopurinol, aspirin, cyanocobalamin, dulaglutide, irbesartan-hydrochlorothiazide, levothyroxine, meloxicam, metformin, metoprolol tartrate, simvastatin, and vitamin d (ergocalciferol). Also, is allergic to oxycodone-aspirin; augmentin [amoxicillin-pot clavulanate]; codeine; etodolac; iodine; levaquin [levofloxacin in d5w]; levofloxacin; metronidazole; sulfa antibiotics; and ultram [tramadol].  Review of Systems  All other systems reviewed and are negative.  Objective: BP 138/80   Pulse 73   Ht 5\' 6"  (1.676 m)   Wt 146 lb (66.2 kg)   BMI 23.57 kg/m  Physical Exam  Constitutional: She is oriented to person, place, and time. She appears well-developed and well-nourished. No distress.  Genitourinary: Vagina normal and uterus normal. Pelvic exam was performed with patient supine. There is no rash, tenderness or lesion on the right labia. There is no rash, tenderness or lesion on the left labia. No erythema or bleeding in the vagina.  Right adnexum does not display mass and does not display tenderness. Left adnexum does not display mass and does not display tenderness. Cervix does not exhibit motion tenderness, discharge, polyp or nabothian cyst.   Uterus is mobile and midaxial. Uterus is not enlarged or exhibiting a mass.  Genitourinary Comments: Atrophy mod. Cystocele 2+, uterine prolapse 2+ without pessary   Abdominal: Soft. She exhibits no distension. There is no tenderness.  Musculoskeletal: Normal range of motion.  Neurological: She is alert and oriented to person, place, and time. No cranial nerve deficit.  Skin: Skin is warm and dry.  Psychiatric: She has a normal mood and affect.   Pessary Care Pessary removed and cleaned.  Vagina checked - without erosions - pessary replaced.  A/P: Pessary was cleaned and replaced today. Instructions given for care. Concerning symptoms to observe for are counseled to patient. Follow up scheduled for 3 months.  Barnett Applebaum, MD, [facility], [facility] 05/24/2017  10:09 AM

## 2017-08-23 ENCOUNTER — Ambulatory Visit (INDEPENDENT_AMBULATORY_CARE_PROVIDER_SITE_OTHER): Payer: Medicare HMO | Admitting: Obstetrics & Gynecology

## 2017-08-23 ENCOUNTER — Encounter: Payer: Self-pay | Admitting: Obstetrics & Gynecology

## 2017-08-23 VITALS — BP 120/80 | HR 78 | Ht 66.0 in | Wt 149.0 lb

## 2017-08-23 DIAGNOSIS — N812 Incomplete uterovaginal prolapse: Secondary | ICD-10-CM

## 2017-08-23 NOTE — Progress Notes (Signed)
  HPI:      [name] is a 84 y.o. G2P0011 who presents today for her pessary follow up and examination related to her pelvic floor weakening.  Pt reports tolerating the pessary well with  no vaginal bleeding and  no vaginal discharge.  Symptoms of pelvic floor weakening have greatly improved. She is voiding and defecating without difficulty. She currently has a #4ring pessary.  PMHx: She  has a past medical history of Abnormal mammogram, unspecified, Breast screening, unspecified, Diabetes ([place]), Hypertension, Mammographic microcalcification, and Vaginal pessary present. Also,  has a past surgical history that includes Skin lesion excision; Breast excisional biopsy (Left); and Breast biopsy (Left, 2013)., family history includes Cancer in her mother.,  reports that  has never smoked. she has never used smokeless tobacco. She reports that she does not drink alcohol or use drugs.  She has a current medication list which includes the following prescription(s): allopurinol, aspirin, cyanocobalamin, dulaglutide, irbesartan-hydrochlorothiazide, levothyroxine, meloxicam, metformin, metoprolol tartrate, simvastatin, and vitamin d (ergocalciferol). Also, is allergic to oxycodone-aspirin; augmentin [amoxicillin-pot clavulanate]; codeine; etodolac; iodine; levaquin [levofloxacin in d5w]; levofloxacin; metronidazole; sulfa antibiotics; and ultram [tramadol].  Review of Systems  All other systems reviewed and are negative.  Objective:BP 120/80   Pulse 78   Ht 5\' 6"  (1.676 m)   Wt 149 lb (67.6 kg)   BMI 24.05 kg/m  Physical Exam  Constitutional: She is oriented to person, place, and time. She appears well-developed and well-nourished. No distress.  Genitourinary: Vagina normal and uterus normal. Pelvic exam was performed with patient supine. There is no rash, tenderness or lesion on the right labia. There is no rash, tenderness or lesion on the left labia. No erythema or bleeding in the vagina. Right  adnexum does not display mass and does not display tenderness. Left adnexum does not display mass and does not display tenderness. Cervix does not exhibit motion tenderness, discharge, polyp or nabothian cyst.   Uterus is mobile and midaxial. Uterus is not enlarged or exhibiting a mass.  Genitourinary Comments: Partial uterovag prolapse w cystocele Min atrophy  Abdominal: Soft. She exhibits no distension. There is no tenderness.  Musculoskeletal: Normal range of motion.  Neurological: She is alert and oriented to person, place, and time. No cranial nerve deficit.  Skin: Skin is warm and dry.  Psychiatric: She has a normal mood and affect.   Pessary Care Pessary removed and cleaned.  Vagina checked - without erosions - pessary replaced.  A/P: Pessary was cleaned and replaced today. Instructions given for care. Concerning symptoms to observe for are counseled to patient. Follow up scheduled for 3 months.  A total of 15 minutes were spent face-to-face with the patient during this encounter and over half of that time dealt with counseling and coordination of care.  Barnett Applebaum, MD, [facility], [facility] 08/23/2017  10:40 AM

## 2017-11-23 ENCOUNTER — Ambulatory Visit: Payer: Medicare HMO | Admitting: Obstetrics & Gynecology

## 2017-11-26 ENCOUNTER — Encounter: Payer: Self-pay | Admitting: Obstetrics & Gynecology

## 2017-11-26 ENCOUNTER — Ambulatory Visit: Payer: Medicare HMO | Admitting: Obstetrics & Gynecology

## 2017-11-26 VITALS — BP 140/80 | Ht 60.0 in | Wt 149.0 lb

## 2017-11-26 DIAGNOSIS — N8111 Cystocele, midline: Secondary | ICD-10-CM

## 2017-11-26 DIAGNOSIS — N812 Incomplete uterovaginal prolapse: Secondary | ICD-10-CM

## 2017-11-26 NOTE — Progress Notes (Signed)
  HPI:      [name] is a 84 y.o. G2P0011 who presents today for her pessary follow up and examination related to her pelvic floor weakening.  Pt reports tolerating the pessary well with  no vaginal bleeding and  no vaginal discharge.  Symptoms of pelvic floor weakening have greatly improved. She is voiding and defecating without difficulty. She currently has a #4 pessary.  PMHx: She  has a past medical history of Abnormal mammogram, unspecified, Breast screening, unspecified, Diabetes ([place]), Hypertension, Mammographic microcalcification, and Vaginal pessary present. Also,  has a past surgical history that includes Skin lesion excision; Breast excisional biopsy (Left); and Breast biopsy (Left, 2013)., family history includes Cancer in her mother.,  reports that she has never smoked. She has never used smokeless tobacco. She reports that she does not drink alcohol or use drugs.  She has a current medication list which includes the following prescription(s): allopurinol, aspirin, cyanocobalamin, dulaglutide, irbesartan-hydrochlorothiazide, levothyroxine, meloxicam, metformin, metoprolol tartrate, simvastatin, and vitamin d (ergocalciferol). Also, is allergic to oxycodone-aspirin; augmentin [amoxicillin-pot clavulanate]; codeine; etodolac; iodine; levaquin [levofloxacin in d5w]; levofloxacin; metronidazole; sulfa antibiotics; and ultram [tramadol].  Review of Systems  All other systems reviewed and are negative.  Objective: BP 140/80   Ht 5' (1.524 m)   Wt 149 lb (67.6 kg)   BMI 29.10 kg/m  Physical Exam  Constitutional: She is oriented to person, place, and time. She appears well-developed and well-nourished. No distress.  Genitourinary: Vagina normal and uterus normal. Pelvic exam was performed with patient supine. There is no rash, tenderness or lesion on the right labia. There is no rash, tenderness or lesion on the left labia. No erythema or bleeding in the vagina. Right adnexum does  not display mass and does not display tenderness. Left adnexum does not display mass and does not display tenderness. Cervix does not exhibit motion tenderness, discharge, polyp or nabothian cyst.   Uterus is mobile and midaxial. Uterus is not enlarged or exhibiting a mass.  Genitourinary Comments: Partial uterovag prolapse w cystocele Min atrophy     HENT:  Head: Normocephalic and atraumatic.  Nose: Nose normal.  Mouth/Throat: Oropharynx is clear and moist.  Abdominal: Soft. She exhibits no distension. There is no tenderness.  Musculoskeletal: Normal range of motion.  Neurological: She is alert and oriented to person, place, and time. No cranial nerve deficit.  Skin: Skin is warm and dry.  Psychiatric: She has a normal mood and affect.   Pessary Care Pessary removed and cleaned.  Vagina checked - without erosions - pessary replaced.  A/P:  Cystocele, Partial Uterovaginal Prolapse  Pessary was cleaned and replaced today. Instructions given for care. Concerning symptoms to observe for are counseled to patient. Follow up scheduled for 3 months.  A total of 15 minutes were spent face-to-face with the patient during this encounter and over half of that time dealt with counseling and coordination of care.  Barnett Applebaum, MD, [facility], [facility] 11/26/2017  8:56 AM

## 2017-12-29 ENCOUNTER — Other Ambulatory Visit: Payer: Self-pay | Admitting: Family Medicine

## 2017-12-29 ENCOUNTER — Ambulatory Visit: Payer: Self-pay

## 2017-12-29 DIAGNOSIS — M7989 Other specified soft tissue disorders: Principal | ICD-10-CM

## 2017-12-29 DIAGNOSIS — M79662 Pain in left lower leg: Secondary | ICD-10-CM

## 2017-12-30 ENCOUNTER — Ambulatory Visit
Admission: RE | Admit: 2017-12-30 | Discharge: 2017-12-30 | Disposition: A | Discharge status: Home / Self Care | Payer: Medicare HMO | Source: Ambulatory Visit | Attending: Family Medicine | Admitting: Family Medicine

## 2017-12-30 DIAGNOSIS — M79662 Pain in left lower leg: Secondary | ICD-10-CM | POA: Insufficient documentation

## 2017-12-30 DIAGNOSIS — M7989 Other specified soft tissue disorders: Secondary | ICD-10-CM | POA: Insufficient documentation

## 2018-02-24 ENCOUNTER — Ambulatory Visit: Payer: Medicare HMO | Admitting: Obstetrics & Gynecology

## 2018-02-24 ENCOUNTER — Encounter: Payer: Self-pay | Admitting: Obstetrics & Gynecology

## 2018-02-24 VITALS — BP 150/70 | Ht 60.0 in | Wt 150.0 lb

## 2018-02-24 DIAGNOSIS — N8111 Cystocele, midline: Secondary | ICD-10-CM | POA: Diagnosis not present

## 2018-02-24 DIAGNOSIS — N812 Incomplete uterovaginal prolapse: Secondary | ICD-10-CM

## 2018-02-24 NOTE — Progress Notes (Signed)
  HPI:      [name] is a 84 y.o. G2P0011 who presents today for her pessary follow up and examination related to her pelvic floor weakening.  Pt reports tolerating the pessary well with  no vaginal bleeding and  no vaginal discharge.  Symptoms of pelvic floor weakening have greatly improved. She is voiding and defecating without difficulty. She currently has a Ring #4 pessary.  PMHx: She  has a past medical history of Abnormal mammogram, unspecified, Breast screening, unspecified, Diabetes ([place]), Hypertension, Mammographic microcalcification, and Vaginal pessary present. Also,  has a past surgical history that includes Skin lesion excision; Breast excisional biopsy (Left); and Breast biopsy (Left, 2013)., family history includes Cancer in her mother.,  reports that she has never smoked. She has never used smokeless tobacco. She reports that she does not drink alcohol or use drugs.  She has a current medication list which includes the following prescription(s): allopurinol, aspirin, cyanocobalamin, dulaglutide, irbesartan-hydrochlorothiazide, levothyroxine, meloxicam, metformin, metoprolol tartrate, simvastatin, and vitamin d (ergocalciferol). Also, is allergic to oxycodone-aspirin; augmentin [amoxicillin-pot clavulanate]; codeine; etodolac; iodine; levaquin [levofloxacin in d5w]; levofloxacin; metronidazole; sulfa antibiotics; and ultram [tramadol].  Review of Systems  All other systems reviewed and are negative.   Objective: BP (!) 150/70   Ht 5' (1.524 m)   Wt 150 lb (68 kg)   BMI 29.29 kg/m  Physical Exam  Constitutional: She is oriented to person, place, and time. She appears well-developed and well-nourished. No distress.  Genitourinary: Vagina normal and uterus normal. Pelvic exam was performed with patient supine. There is no rash, tenderness or lesion on the right labia. There is no rash, tenderness or lesion on the left labia. No erythema or bleeding in the vagina. Right  adnexum does not display mass and does not display tenderness. Left adnexum does not display mass and does not display tenderness. Cervix does not exhibit motion tenderness, discharge, polyp or nabothian cyst.   Uterus is mobile and midaxial. Uterus is not enlarged or exhibiting a mass.  Genitourinary Comments: Mild atrophy Prolapse and cystocele present  HENT:  Head: Normocephalic and atraumatic.  Nose: Nose normal.  Mouth/Throat: Oropharynx is clear and moist.  Abdominal: Soft. She exhibits no distension. There is no tenderness.  Musculoskeletal: Normal range of motion.  Neurological: She is alert and oriented to person, place, and time. No cranial nerve deficit.  Skin: Skin is warm and dry.  Psychiatric: She has a normal mood and affect.   Pessary Care Pessary removed and cleaned.  Vagina checked - without erosions - pessary replaced.  A/P: 1. Uterovaginal prolapse, incomplete 2. Cystocele, midline Pessary was cleaned and replaced today. Instructions given for care. Concerning symptoms to observe for are counseled to patient. Follow up scheduled for 3 months.  A total of 15 minutes were spent face-to-face with the patient during this encounter and over half of that time dealt with counseling and coordination of care.  Barnett Applebaum, MD, [facility], [facility] 02/24/2018  8:18 AM

## 2018-02-25 ENCOUNTER — Ambulatory Visit: Payer: Self-pay | Admitting: Obstetrics & Gynecology

## 2018-03-07 ENCOUNTER — Ambulatory Visit (INDEPENDENT_AMBULATORY_CARE_PROVIDER_SITE_OTHER): Payer: Medicare HMO | Admitting: Vascular Surgery

## 2018-03-07 ENCOUNTER — Encounter (INDEPENDENT_AMBULATORY_CARE_PROVIDER_SITE_OTHER): Payer: Self-pay | Admitting: Vascular Surgery

## 2018-03-07 VITALS — BP 187/77 | HR 62 | Resp 16 | Ht 66.0 in | Wt 147.4 lb

## 2018-03-07 DIAGNOSIS — M79605 Pain in left leg: Secondary | ICD-10-CM | POA: Diagnosis not present

## 2018-03-07 DIAGNOSIS — M79604 Pain in right leg: Secondary | ICD-10-CM | POA: Insufficient documentation

## 2018-03-07 DIAGNOSIS — R600 Localized edema: Secondary | ICD-10-CM | POA: Insufficient documentation

## 2018-03-07 DIAGNOSIS — L03116 Cellulitis of left lower limb: Secondary | ICD-10-CM | POA: Diagnosis not present

## 2018-03-07 MED ORDER — DOXYCYCLINE HYCLATE 100 MG PO CAPS: 100.0000 mg | ORAL_CAPSULE | Freq: Every day | ORAL | 0 refills | Status: DC

## 2018-03-07 NOTE — Progress Notes (Signed)
Subjective:    Patient ID: Erika Ochoa, female    DOB: Apr 09, 1925, 84 y.o.   MRN: [id number] Chief Complaint  Patient presents with  . New Patient (Initial Visit)    ref [place] for possible statis   Presents as a new patient referred by Dr. Kary Kos for evaluation of bilateral lower extremity pain discomfort.  The patient was seen with a family member.  The patient endorses a long-standing history of bilateral lower extremity edema.  She notes that this edema is associated with some discomfort.  She notes that her edema and discomfort worsen towards the end of the day.  The patient presents today with some erythema to the left ankle and foot.  The patient denies any claudication-like symptoms, rest pain or ulcer formation to the bilateral lower extremity.  The patient notes that the more her edema increases the worse her discomfort becomes.  The patient also notes a darkening and skin color to the medial aspect of her left ankle.  The patient does not engage in conservative therapy including wearing medical grade 1 compression socks, elevating her legs and remaining active on a daily basis.  The patient denies being diagnosed with recurrent cellulitis.  Patient denies any recent surgery or trauma to the bilateral legs.  Patient denies any DVT history.  The patient denies any fever, nausea vomiting.  Review of Systems  Constitutional: Negative.   HENT: Negative.   Eyes: Negative.   Respiratory: Negative.   Cardiovascular: Positive for leg swelling.       Lower Extremity Pain Cellulitis  Gastrointestinal: Negative.   Endocrine: Negative.   Genitourinary: Negative.   Musculoskeletal: Negative.   Skin: Negative.   Allergic/Immunologic: Negative.   Neurological: Negative.   Hematological: Negative.   Psychiatric/Behavioral: Negative.       Objective:   Physical Exam  Constitutional: She is oriented to person, place, and time. She appears well-developed and well-nourished. No distress.    HENT:  Head: Normocephalic and atraumatic.  Right Ear: External ear normal.  Left Ear: External ear normal.  Eyes: Pupils are equal, round, and reactive to light. Conjunctivae and EOM are normal.  Cardiovascular: Normal rate, regular rhythm, normal heart sounds and intact distal pulses.  Pulses:      Radial pulses are 2+ on the right side, and 2+ on the left side.  Hard to palpate pedal pulses in the feet are cooler.  No pallor or bluish tint.  Pulmonary/Chest: Effort normal and breath sounds normal.  Musculoskeletal: Normal range of motion.  Neurological: She is alert and oriented to person, place, and time.  Skin: Skin is warm and dry. She is not diaphoretic.  Mild nonpitting edema noted bilaterally  Psychiatric: She has a normal mood and affect. Her behavior is normal. Judgment and thought content normal.  Vitals reviewed.  BP (!) 187/77 (BP Location: Right Arm)   Pulse 62   Resp 16   Ht 5\' 6"  (1.676 m)   Wt 147 lb 6.4 oz (66.9 kg)   BMI 23.79 kg/m   Past Medical History:  Diagnosis Date  . Abnormal mammogram, unspecified   . Breast screening, unspecified   . Diabetes ([place])   . Hypertension   . Mammographic microcalcification   . Vaginal pessary present    Social History   Socioeconomic History  . Marital status: Widowed    Spouse name: Not on file  . Number of children: Not on file  . Years of education: Not on file  . Highest  education level: Not on file  Occupational History  . Not on file  Social Needs  . Financial resource strain: Not on file  . Food insecurity:    Worry: Not on file    Inability: Not on file  . Transportation needs:    Medical: Not on file    Non-medical: Not on file  Tobacco Use  . Smoking status: Never Smoker  . Smokeless tobacco: Never Used  Substance and Sexual Activity  . Alcohol use: No    Alcohol/week: 0.0 standard drinks  . Drug use: No  . Sexual activity: Not on file  Lifestyle  . Physical activity:    Days per week:  Not on file    Minutes per session: Not on file  . Stress: Not on file  Relationships  . Social connections:    Talks on phone: Not on file    Gets together: Not on file    Attends religious service: Not on file    Active member of club or organization: Not on file    Attends meetings of clubs or organizations: Not on file    Relationship status: Not on file  . Intimate partner violence:    Fear of current or ex partner: Not on file    Emotionally abused: Not on file    Physically abused: Not on file    Forced sexual activity: Not on file  Other Topics Concern  . Not on file  Social History Narrative  . Not on file   Past Surgical History:  Procedure Laterality Date  . BREAST BIOPSY Left 2013   stereo calcs  . BREAST EXCISIONAL BIOPSY Left    "years ago"  . SKIN LESION EXCISION     Family History  Problem Relation Age of Onset  . Cancer Mother   . Breast cancer Neg Hx    Allergies  Allergen Reactions  . Oxycodone-Aspirin Other (See Comments)    Other Reaction: GI Upset  . Augmentin [Amoxicillin-Pot Clavulanate] Nausea Only  . Codeine Nausea Only  . Etodolac Other (See Comments)  . Iodine Nausea Only  . Levaquin [Levofloxacin In D5w] Nausea Only  . Levofloxacin Nausea Only  . Metronidazole Nausea Only  . Sulfa Antibiotics Nausea Only  . Ultram [Tramadol] Nausea Only      Assessment & Plan:  Presents as a new patient referred by Dr. Kary Kos for evaluation of bilateral lower extremity pain discomfort.  The patient was seen with a family member.  The patient endorses a long-standing history of bilateral lower extremity edema.  She notes that this edema is associated with some discomfort.  She notes that her edema and discomfort worsen towards the end of the day.  The patient presents today with some erythema to the left ankle and foot.  The patient denies any claudication-like symptoms, rest pain or ulcer formation to the bilateral lower extremity.  The patient notes  that the more her edema increases the worse her discomfort becomes.  The patient also notes a darkening and skin color to the medial aspect of her left ankle.  The patient does not engage in conservative therapy including wearing medical grade 1 compression socks, elevating her legs and remaining active on a daily basis.  The patient denies being diagnosed with recurrent cellulitis.  Patient denies any recent surgery or trauma to the bilateral legs.  Patient denies any DVT history.  The patient denies any fever, nausea vomiting.  1. Cellulitis of leg, left - New Mild cellulitis to  the right lower extremity I have prescribed doxycycline 100 mg 1 tab daily x7 days.  This was sent over electronically to the patient's pharmacy.  The patient's pharmacy was confirmed I will bring the patient back and have her undergo bilateral ABI to rule out any contributing peripheral artery disease  - VAS [place] ABI WITH/WO TBI; Future  2. Bilateral edema of lower extremity - New The patient was encouraged to wear graduated compression stockings (20-30 mmHg) on a daily basis. The patient was instructed to begin wearing the stockings first thing in the morning and removing them in the evening. The patient was instructed specifically not to sleep in the stockings. Prescription given.  In addition, behavioral modification including elevation during the day will be initiated. Anti-inflammatories for pain. I will bring the patient back and have her undergo a bilateral lower extremity venous duplex to rule out any contributing venous versus lymphatic disease The patient will follow up in one months to asses conservative management.  The patient states that she owns multiple pairs of medical grade 1 compression socks. The patient was instructed to call the office in the interim if any worsening edema or ulcerations to the legs, feet or toes occurs. The patient expresses their understanding.  - VAS [place] LOWER EXTREMITY VENOUS  REFLUX; Future  3. Bilateral lower extremity pain - New As above  - VAS [place] ABI WITH/WO TBI; Future  Current Outpatient Medications on File Prior to Visit  Medication Sig Dispense Refill  . allopurinol (ZYLOPRIM) 100 MG tablet Take 100 mg by mouth daily.     Marland Kitchen aspirin 81 MG tablet Take 81 mg by mouth daily.    . cyanocobalamin (,VITAMIN B-12,) 1000 MCG/ML injection Inject into the muscle.    . Dulaglutide (TRULICITY) 1.5 BW/6.2MB SOPN Inject 1.5 mg into the skin every 7 (seven) days.    . irbesartan-hydrochlorothiazide (AVALIDE) 300-12.5 MG per tablet Take 1 tablet by mouth daily.     Marland Kitchen levothyroxine (SYNTHROID, LEVOTHROID) 50 MCG tablet Take 50 mcg by mouth daily.     . meloxicam (MOBIC) 15 MG tablet Take 15 mg by mouth daily.    . metFORMIN (GLUCOPHAGE-XR) 500 MG 24 hr tablet Take 500 mg by mouth daily with breakfast.    . metoprolol tartrate (LOPRESSOR) 25 MG tablet Take 25 mg by mouth 2 (two) times daily.    . simvastatin (ZOCOR) 20 MG tablet Take 20 mg by mouth every evening.     . Vitamin D, Ergocalciferol, (DRISDOL) 50000 UNITS CAPS capsule Take 50,000 Units by mouth every 7 (seven) days.   99   No current facility-administered medications on file prior to visit.     There are no Patient Instructions on file for this visit. No follow-ups on file.   Cuauhtemoc Huegel A Macey Wurtz, PA-C

## 2018-04-11 ENCOUNTER — Ambulatory Visit (INDEPENDENT_AMBULATORY_CARE_PROVIDER_SITE_OTHER): Payer: Medicare HMO

## 2018-04-11 ENCOUNTER — Encounter (INDEPENDENT_AMBULATORY_CARE_PROVIDER_SITE_OTHER): Payer: Self-pay | Admitting: Vascular Surgery

## 2018-04-11 ENCOUNTER — Ambulatory Visit (INDEPENDENT_AMBULATORY_CARE_PROVIDER_SITE_OTHER): Payer: Medicare HMO | Admitting: Vascular Surgery

## 2018-04-11 VITALS — BP 197/79 | HR 59 | Resp 16 | Ht 66.5 in | Wt 151.2 lb

## 2018-04-11 DIAGNOSIS — M79605 Pain in left leg: Secondary | ICD-10-CM | POA: Diagnosis not present

## 2018-04-11 DIAGNOSIS — M79604 Pain in right leg: Secondary | ICD-10-CM

## 2018-04-11 DIAGNOSIS — R600 Localized edema: Secondary | ICD-10-CM | POA: Diagnosis not present

## 2018-04-11 DIAGNOSIS — I10 Essential (primary) hypertension: Secondary | ICD-10-CM | POA: Diagnosis not present

## 2018-04-11 DIAGNOSIS — L03116 Cellulitis of left lower limb: Secondary | ICD-10-CM | POA: Diagnosis not present

## 2018-04-11 DIAGNOSIS — I482 Chronic atrial fibrillation, unspecified: Secondary | ICD-10-CM | POA: Diagnosis not present

## 2018-04-11 DIAGNOSIS — E119 Type 2 diabetes mellitus without complications: Secondary | ICD-10-CM | POA: Diagnosis not present

## 2018-04-11 DIAGNOSIS — I25118 Atherosclerotic heart disease of native coronary artery with other forms of angina pectoris: Secondary | ICD-10-CM

## 2018-04-11 NOTE — Progress Notes (Signed)
MRN : [id number]  Erika Ochoa is a 84 y.o. (1925/01/16) female who presents with chief complaint of  Chief Complaint  Patient presents with  . Follow-up    68month ultrasound follow up  .  History of Present Illness: The patient is seen for evaluation of painful lower extremities. Patient notes the pain is variable and not always associated with activity.  The pain is somewhat consistent day to day occurring on most days. The patient notes the pain also occurs with standing and routinely seems worse as the day wears on. The pain has been progressive over the past several years. The patient states these symptoms are causing  a profound negative impact on quality of life and daily activities.  The patient denies rest pain or dangling of an extremity off the side of the bed during the night for relief. No open wounds or sores at this time. No history of DVT or phlebitis. No prior interventions or surgeries.  There is a  history of back problems and DJD of the lumbar and sacral spine.    Current Meds  Medication Sig  . allopurinol (ZYLOPRIM) 100 MG tablet Take 100 mg by mouth daily.   Marland Kitchen aspirin 81 MG tablet Take 81 mg by mouth daily.  . cyanocobalamin (,VITAMIN B-12,) 1000 MCG/ML injection Inject into the muscle.  Marland Kitchen doxycycline (VIBRAMYCIN) 100 MG capsule Take 1 capsule (100 mg total) by mouth daily.  . Dulaglutide (TRULICITY) 1.5 AC/1.6SA SOPN Inject 1.5 mg into the skin every 7 (seven) days.  . irbesartan-hydrochlorothiazide (AVALIDE) 300-12.5 MG per tablet Take 1 tablet by mouth daily.   Marland Kitchen levothyroxine (SYNTHROID, LEVOTHROID) 50 MCG tablet Take 50 mcg by mouth daily.   . meloxicam (MOBIC) 15 MG tablet Take 15 mg by mouth daily.  . metFORMIN (GLUCOPHAGE-XR) 500 MG 24 hr tablet Take 500 mg by mouth daily with breakfast.  . metoprolol tartrate (LOPRESSOR) 25 MG tablet Take 25 mg by mouth 2 (two) times daily.  . simvastatin (ZOCOR) 20 MG tablet Take 20 mg by mouth every evening.   .  Vitamin D, Ergocalciferol, (DRISDOL) 50000 UNITS CAPS capsule Take 50,000 Units by mouth every 7 (seven) days.     Past Medical History:  Diagnosis Date  . Abnormal mammogram, unspecified   . Breast screening, unspecified   . Diabetes ([place])   . Hypertension   . Mammographic microcalcification   . Vaginal pessary present     Past Surgical History:  Procedure Laterality Date  . BREAST BIOPSY Left 2013   stereo calcs  . BREAST EXCISIONAL BIOPSY Left    "years ago"  . SKIN LESION EXCISION      Social History Social History   Tobacco Use  . Smoking status: Never Smoker  . Smokeless tobacco: Never Used  Substance Use Topics  . Alcohol use: No    Alcohol/week: 0.0 standard drinks  . Drug use: No    Family History Family History  Problem Relation Age of Onset  . Cancer Mother   . Breast cancer Neg Hx     Allergies  Allergen Reactions  . Oxycodone-Aspirin Other (See Comments)    Other Reaction: GI Upset  . Augmentin [Amoxicillin-Pot Clavulanate] Nausea Only  . Codeine Nausea Only  . Etodolac Other (See Comments)  . Iodine Nausea Only  . Levaquin [Levofloxacin In D5w] Nausea Only  . Levofloxacin Nausea Only  . Metronidazole Nausea Only  . Sulfa Antibiotics Nausea Only  . Ultram [Tramadol] Nausea Only  REVIEW OF SYSTEMS (Negative unless checked)  Constitutional: [] Weight loss  [] Fever  [] Chills Cardiac: [] Chest pain   [] Chest pressure   [] Palpitations   [] Shortness of breath when laying flat   [] Shortness of breath with exertion. Vascular:  [x] Pain in legs with walking   [x] Pain in legs at rest  [] History of DVT   [] Phlebitis   [] Swelling in legs   [] Varicose veins   [] Non-healing ulcers Pulmonary:   [] Uses home oxygen   [] Productive cough   [] Hemoptysis   [] Wheeze  [] COPD   [] Asthma Neurologic:  [] Dizziness   [] Seizures   [] History of stroke   [] History of TIA  [] Aphasia   [] Vissual changes   [] Weakness or numbness in arm   [] Weakness or numbness in  leg Musculoskeletal:   [] Joint swelling   [] Joint pain   [] Low back pain Hematologic:  [] Easy bruising  [] Easy bleeding   [] Hypercoagulable state   [] Anemic Gastrointestinal:  [] Diarrhea   [] Vomiting  [] Gastroesophageal reflux/heartburn   [] Difficulty swallowing. Genitourinary:  [] Chronic kidney disease   [] Difficult urination  [] Frequent urination   [] Blood in urine Skin:  [] Rashes   [] Ulcers  Psychological:  [] History of anxiety   []  History of major depression.  Physical Examination  Vitals:   04/11/18 1029  BP: (!) 197/79  Pulse: (!) 59  Resp: 16  Weight: 151 lb 3.2 oz (68.6 kg)  Height: 5' 6.5" (1.689 m)   Body mass index is 24.04 kg/m. Gen: WD/WN, NAD Head: [place]/AT, No temporalis wasting.  Ear/Nose/Throat: Hearing grossly intact, nares w/o erythema or drainage Eyes: PER, EOMI, sclera nonicteric.  Neck: Supple, no large masses.   Pulmonary:  Good air movement, no audible wheezing bilaterally, no use of accessory muscles.  Cardiac: RRR, no JVD Vascular:  Vessel Right Left  Radial Palpable Palpable  PT Not Palpable Not Palpable  DP Trace Palpable Trace Palpable  Gastrointestinal: Non-distended. No guarding/no peritoneal signs.  Musculoskeletal: M/S 5/5 throughout.  No deformity or atrophy.  Neurologic: CN 2-12 intact. Symmetrical.  Speech is fluent. Motor exam as listed above. Psychiatric: Judgment intact, Mood & affect appropriate for pt's clinical situation. Dermatologic: No rashes or ulcers noted.  No changes consistent with cellulitis. Lymph : No lichenification or skin changes of chronic lymphedema.  CBC Lab Results  Component Value Date   WBC 11.0 12/19/2015   HGB 13.0 12/19/2015   HCT 38.4 12/19/2015   MCV 98.0 12/19/2015   PLT 271 12/19/2015    BMET    Component Value Date/Time   NA 138 12/19/2015 1851   NA 136 05/05/2014 1032   K 3.9 12/19/2015 1851   K 3.9 05/05/2014 1032   CL 103 12/19/2015 1851   CL 103 05/05/2014 1032   CO2 26 12/19/2015 1851    CO2 27 05/05/2014 1032   GLUCOSE 201 (H) 12/19/2015 1851   GLUCOSE 226 (H) 05/05/2014 1032   BUN 32 (H) 12/19/2015 1851   BUN 24 (H) 05/05/2014 1032   CREATININE 1.48 (H) 12/19/2015 1851   CREATININE 1.47 (H) 05/05/2014 1032   CALCIUM 9.2 12/19/2015 1851   CALCIUM 9.1 05/05/2014 1032   GFRNONAA 30 (L) 12/19/2015 1851   GFRNONAA 36 (L) 05/05/2014 1032   GFRNONAA 34 (L) 02/11/2012 1403   GFRAA 35 (L) 12/19/2015 1851   GFRAA 43 (L) 05/05/2014 1032   GFRAA 39 (L) 02/11/2012 1403   CrCl cannot be calculated (Patient's most recent lab result is older than the maximum 21 days allowed.).  COAG No results found for: INR,  PROTIME  Radiology Vas [place] Burnard Bunting With/wo Tbi  Result Date: 04/11/2018 LOWER EXTREMITY DOPPLER STUDY Indications: Rest pain, and peripheral artery disease.  Performing Technologist: Charlane Ferretti RT (R)(VS)  Examination Guidelines: A complete evaluation includes at minimum, Doppler waveform signals and systolic blood pressure reading at the level of bilateral brachial, anterior tibial, and posterior tibial arteries, when vessel segments are accessible. Bilateral testing is considered an integral part of a complete examination. Photoelectric Plethysmograph (PPG) waveforms and toe systolic pressure readings are included as required and additional duplex testing as needed. Limited examinations for reoccurring indications may be performed as noted.  ABI Findings: +---------+------------------+-----+---------+--------+ Right    Rt Pressure (mmHg)IndexWaveform Comment  +---------+------------------+-----+---------+--------+ Brachial 192                                      +---------+------------------+-----+---------+--------+ ATA      192               1.00 triphasic         +---------+------------------+-----+---------+--------+ PTA      205               1.07 triphasic         +---------+------------------+-----+---------+--------+ Great Toe100                0.52 Normal            +---------+------------------+-----+---------+--------+ +---------+------------------+-----+--------+-------+ Left     Lt Pressure (mmHg)IndexWaveformComment +---------+------------------+-----+--------+-------+ Brachial 189                                    +---------+------------------+-----+--------+-------+ ATA      166               0.86 biphasic        +---------+------------------+-----+--------+-------+ PTA      168               0.88 biphasic        +---------+------------------+-----+--------+-------+ Great Toe96                0.50 Abnormal        +---------+------------------+-----+--------+-------+ +-------+-----------+-----------+------------+------------+ ABI/TBIToday's ABIToday's TBIPrevious ABIPrevious TBI +-------+-----------+-----------+------------+------------+ Right  1.07       .52                                 +-------+-----------+-----------+------------+------------+ Left   .88        .50                                 +-------+-----------+-----------+------------+------------+ Summary: Right: Resting right ankle-brachial index is within normal range. No evidence of significant right lower extremity arterial disease. The right toe-brachial index is abnormal. Left: Resting left ankle-brachial index indicates mild left lower extremity arterial disease. The left toe-brachial index is abnormal.  *See table(s) above for measurements and observations.  Electronically signed by Hortencia Pilar MD on 04/11/2018 at 4:55:45 PM.   Final    Vas [place] Lower Extremity Venous Reflux  Result Date: 04/11/2018  Lower Venous Reflux Study Indications: Pain.  Performing Technologist: Charlane Ferretti RT (R)(VS)  Examination Guidelines: A complete evaluation includes B-mode imaging, spectral Doppler, color Doppler, and power Doppler as needed of all accessible portions  of each vessel. Bilateral testing is considered an integral part of  a complete examination. Limited examinations for reoccurring indications may be performed as noted. The reflux portion of the exam is performed with the patient in reverse Trendelenburg.  Right Venous Findings: +---------+---------------+---------+-----------+----------+-------+          CompressibilityPhasicitySpontaneityPropertiesSummary +---------+---------------+---------+-----------+----------+-------+ CFV      Full                                                 +---------+---------------+---------+-----------+----------+-------+ SFJ      Full                                                 +---------+---------------+---------+-----------+----------+-------+ FV Prox  Full                                                 +---------+---------------+---------+-----------+----------+-------+ FV Mid   Full                                                 +---------+---------------+---------+-----------+----------+-------+ FV DistalFull                                                 +---------+---------------+---------+-----------+----------+-------+ PFV      Full                                                 +---------+---------------+---------+-----------+----------+-------+ POP      Full                                                 +---------+---------------+---------+-----------+----------+-------+ GSV      Full                                                 +---------+---------------+---------+-----------+----------+-------+ SSV      Full                                                 +---------+---------------+---------+-----------+----------+-------+  Left Venous Findings: +---------+---------------+---------+-----------+----------+-------+          CompressibilityPhasicitySpontaneityPropertiesSummary +---------+---------------+---------+-----------+----------+-------+ CFV      Full                                                  +---------+---------------+---------+-----------+----------+-------+  SFJ      Full                                                 +---------+---------------+---------+-----------+----------+-------+ FV Prox  Full                                                 +---------+---------------+---------+-----------+----------+-------+ FV Mid   Full                                                 +---------+---------------+---------+-----------+----------+-------+ FV DistalFull                                                 +---------+---------------+---------+-----------+----------+-------+ PFV      Full                                                 +---------+---------------+---------+-----------+----------+-------+ POP      Full                                                 +---------+---------------+---------+-----------+----------+-------+ GSV      Full                                                 +---------+---------------+---------+-----------+----------+-------+ SSV      Full                                                 +---------+---------------+---------+-----------+----------+-------+  Right Reflux Technical Findings: Reflux greater than 517ms in the CFV and SFJ. Left Reflux Technical Findings: Reflux greater than 53ms in the CFV.  Summary: Right: There is no evidence of deep vein thrombosis in the lower extremity.There is no evidence of superficial venous thrombosis. Left: There is no evidence of deep vein thrombosis in the lower extremity.There is no evidence of superficial venous thrombosis.  *See table(s) above for measurements and observations. Electronically signed by Hortencia Pilar MD on 04/11/2018 at 4:55:56 PM.    Final      Assessment/Plan 1. Bilateral lower extremity pain Recommend:  I do not find evidence of Vascular pathology that would explain the patient's symptoms  The patient has atypical pain symptoms for  vascular disease  Noninvasive studies including venous ultrasound of the legs and ABI's do not identify vascular problems  The patient should continue walking and begin a more formal exercise program. The patient should continue his  antiplatelet therapy and aggressive treatment of the lipid abnormalities. The patient should begin wearing graduated compression socks 15-20 mmHg strength to control her mild edema.  Patient will follow-up with me on a PRN basis  Further work-up of her lower extremity pain is deferred to the primary service as it appears to be more likely secondary to DJD    2. Type 2 diabetes mellitus without complication, without long-term current use of insulin (HCC) Continue hypoglycemic medications as already ordered, these medications have been reviewed and there are no changes at this time.  Hgb A1C to be monitored as already arranged by primary service   3. Essential (primary) hypertension Continue antihypertensive medications as already ordered, these medications have been reviewed and there are no changes at this time.   4. Chronic atrial fibrillation Continue antiarrhythmia medications as already ordered, these medications have been reviewed and there are no changes at this time.  Continue anticoagulation as ordered by Cardiology Service   5. Coronary artery disease of native artery of native heart with stable angina pectoris (HCC) Continue cardiac and antihypertensive medications as already ordered and reviewed, no changes at this time.  Continue statin as ordered and reviewed, no changes at this time  Nitrates PRN for chest pain     Hortencia Pilar, MD  04/11/2018 8:30 PM

## 2018-05-18 ENCOUNTER — Other Ambulatory Visit: Payer: Self-pay | Admitting: Family Medicine

## 2018-05-18 DIAGNOSIS — Z1231 Encounter for screening mammogram for malignant neoplasm of breast: Secondary | ICD-10-CM

## 2018-05-24 ENCOUNTER — Ambulatory Visit (INDEPENDENT_AMBULATORY_CARE_PROVIDER_SITE_OTHER): Payer: Medicare HMO | Admitting: Obstetrics & Gynecology

## 2018-05-24 ENCOUNTER — Encounter: Payer: Self-pay | Admitting: Obstetrics & Gynecology

## 2018-05-24 VITALS — BP 138/80 | Ht 67.0 in | Wt 154.0 lb

## 2018-05-24 DIAGNOSIS — N8111 Cystocele, midline: Secondary | ICD-10-CM | POA: Diagnosis not present

## 2018-05-24 DIAGNOSIS — N812 Incomplete uterovaginal prolapse: Secondary | ICD-10-CM | POA: Diagnosis not present

## 2018-05-24 NOTE — Progress Notes (Signed)
  HPI:      [name] is a 84 y.o. G2P0011 who presents today for her pessary follow up and examination related to her pelvic floor weakening.  Pt reports tolerating the pessary well with  no vaginal bleeding and  no vaginal discharge.  Symptoms of pelvic floor weakening have greatly improved. She is voiding and defecating without difficulty. She currently has a ring # 4 pessary.  PMHx: She  has a past medical history of Abnormal mammogram, unspecified, Breast screening, unspecified, Diabetes ([place]), Hypertension, Mammographic microcalcification, and Vaginal pessary present. Also,  has a past surgical history that includes Skin lesion excision; Breast excisional biopsy (Left); and Breast biopsy (Left, 2013)., family history includes Cancer in her mother.,  reports that she has never smoked. She has never used smokeless tobacco. She reports that she does not drink alcohol or use drugs.  She has a current medication list which includes the following prescription(s): allopurinol, aspirin, cyanocobalamin, doxycycline, dulaglutide, irbesartan-hydrochlorothiazide, levothyroxine, meloxicam, metformin, metoprolol tartrate, simvastatin, and vitamin d (ergocalciferol). Also, is allergic to oxycodone-aspirin; augmentin [amoxicillin-pot clavulanate]; codeine; etodolac; iodine; levaquin [levofloxacin in d5w]; levofloxacin; metronidazole; sulfa antibiotics; and ultram [tramadol].  Review of Systems  All other systems reviewed and are negative.  Objective: BP 138/80   Ht 5\' 7"  (1.702 m)   Wt 154 lb (69.9 kg)   BMI 24.12 kg/m  Physical Exam Constitutional:      General: She is not in acute distress.    Appearance: She is well-developed.  Genitourinary:     Pelvic exam was performed with patient supine.     Vagina and uterus normal.     No vaginal erythema or bleeding.     No cervical motion tenderness, discharge, polyp or nabothian cyst.     Uterus is mobile.     Uterus is not enlarged.     No  uterine mass detected.    Uterus is midaxial.     No right or left adnexal mass present.     Right adnexa not tender.     Left adnexa not tender.     Genitourinary Comments: Mild atrophy Prolapse and cystocele present   HENT:     Head: Normocephalic and atraumatic.     Nose: Nose normal.  Abdominal:     General: There is no distension.     Palpations: Abdomen is soft.     Tenderness: There is no abdominal tenderness.  Musculoskeletal: Normal range of motion.  Neurological:     Mental Status: She is alert and oriented to person, place, and time.     Cranial Nerves: No cranial nerve deficit.  Skin:    General: Skin is warm and dry.   Pessary Care Pessary removed and cleaned.  Vagina checked - without erosions - pessary replaced.  A/P:1. Uterovaginal prolapse, incomplete 2. Cystocele, midline Pessary was cleaned and replaced today. Instructions given for care. Concerning symptoms to observe for are counseled to patient. Follow up scheduled for 3 months.  A total of 15 minutes were spent face-to-face with the patient during this encounter and over half of that time dealt with counseling and coordination of care.  Barnett Applebaum, MD, [facility], [facility] 05/24/2018  8:40 AM

## 2018-05-25 ENCOUNTER — Ambulatory Visit: Payer: Self-pay | Admitting: Obstetrics & Gynecology

## 2018-06-02 ENCOUNTER — Ambulatory Visit
Admission: RE | Admit: 2018-06-02 | Discharge: 2018-06-02 | Disposition: A | Discharge status: Home / Self Care | Payer: Medicare HMO | Source: Ambulatory Visit | Attending: Family Medicine | Admitting: Family Medicine

## 2018-06-02 DIAGNOSIS — Z1231 Encounter for screening mammogram for malignant neoplasm of breast: Secondary | ICD-10-CM | POA: Insufficient documentation

## 2018-06-10 ENCOUNTER — Encounter: Payer: Self-pay | Admitting: *Deleted

## 2018-06-10 ENCOUNTER — Other Ambulatory Visit: Payer: Self-pay

## 2018-06-10 ENCOUNTER — Inpatient Hospital Stay
Admission: EM | Admit: 2018-06-10 | Discharge: 2018-06-12 | DRG: 378 | Disposition: A | Discharge status: Home / Self Care | Payer: Medicare HMO | Attending: Internal Medicine | Admitting: Internal Medicine

## 2018-06-10 ENCOUNTER — Emergency Department: Payer: Medicare HMO

## 2018-06-10 DIAGNOSIS — E1122 Type 2 diabetes mellitus with diabetic chronic kidney disease: Secondary | ICD-10-CM | POA: Diagnosis present

## 2018-06-10 DIAGNOSIS — Z79899 Other long term (current) drug therapy: Secondary | ICD-10-CM | POA: Diagnosis not present

## 2018-06-10 DIAGNOSIS — Z881 Allergy status to other antibiotic agents status: Secondary | ICD-10-CM

## 2018-06-10 DIAGNOSIS — Z791 Long term (current) use of non-steroidal anti-inflammatories (NSAID): Secondary | ICD-10-CM | POA: Diagnosis not present

## 2018-06-10 DIAGNOSIS — D62 Acute posthemorrhagic anemia: Secondary | ICD-10-CM | POA: Diagnosis present

## 2018-06-10 DIAGNOSIS — R531 Weakness: Secondary | ICD-10-CM | POA: Diagnosis present

## 2018-06-10 DIAGNOSIS — Z86010 Personal history of colonic polyps: Secondary | ICD-10-CM | POA: Diagnosis not present

## 2018-06-10 DIAGNOSIS — Z79890 Hormone replacement therapy (postmenopausal): Secondary | ICD-10-CM | POA: Diagnosis not present

## 2018-06-10 DIAGNOSIS — N183 Chronic kidney disease, stage 3 (moderate): Secondary | ICD-10-CM | POA: Diagnosis present

## 2018-06-10 DIAGNOSIS — Z7984 Long term (current) use of oral hypoglycemic drugs: Secondary | ICD-10-CM | POA: Diagnosis not present

## 2018-06-10 DIAGNOSIS — Z8711 Personal history of peptic ulcer disease: Secondary | ICD-10-CM

## 2018-06-10 DIAGNOSIS — Z7982 Long term (current) use of aspirin: Secondary | ICD-10-CM | POA: Diagnosis not present

## 2018-06-10 DIAGNOSIS — N179 Acute kidney failure, unspecified: Secondary | ICD-10-CM | POA: Diagnosis present

## 2018-06-10 DIAGNOSIS — K5790 Diverticulosis of intestine, part unspecified, without perforation or abscess without bleeding: Secondary | ICD-10-CM | POA: Diagnosis present

## 2018-06-10 DIAGNOSIS — Z888 Allergy status to other drugs, medicaments and biological substances status: Secondary | ICD-10-CM

## 2018-06-10 DIAGNOSIS — Z885 Allergy status to narcotic agent status: Secondary | ICD-10-CM

## 2018-06-10 DIAGNOSIS — I129 Hypertensive chronic kidney disease with stage 1 through stage 4 chronic kidney disease, or unspecified chronic kidney disease: Secondary | ICD-10-CM | POA: Diagnosis present

## 2018-06-10 DIAGNOSIS — K254 Chronic or unspecified gastric ulcer with hemorrhage: Principal | ICD-10-CM | POA: Diagnosis present

## 2018-06-10 DIAGNOSIS — Z882 Allergy status to sulfonamides status: Secondary | ICD-10-CM

## 2018-06-10 DIAGNOSIS — D649 Anemia, unspecified: Secondary | ICD-10-CM

## 2018-06-10 DIAGNOSIS — K922 Gastrointestinal hemorrhage, unspecified: Secondary | ICD-10-CM | POA: Diagnosis present

## 2018-06-10 DIAGNOSIS — K449 Diaphragmatic hernia without obstruction or gangrene: Secondary | ICD-10-CM | POA: Diagnosis not present

## 2018-06-10 DIAGNOSIS — K253 Acute gastric ulcer without hemorrhage or perforation: Secondary | ICD-10-CM | POA: Diagnosis not present

## 2018-06-10 DIAGNOSIS — K921 Melena: Secondary | ICD-10-CM | POA: Diagnosis not present

## 2018-06-10 LAB — GLUCOSE, CAPILLARY: Glucose-Capillary: 109 mg/dL — ABNORMAL HIGH (ref 70–99)

## 2018-06-10 LAB — COMPREHENSIVE METABOLIC PANEL
ALBUMIN: 3.6 g/dL (ref 3.5–5.0)
ALT: 20 U/L (ref 0–44)
ANION GAP: 9 (ref 5–15)
AST: 24 U/L (ref 15–41)
Alkaline Phosphatase: 71 U/L (ref 38–126)
BUN: 45 mg/dL — ABNORMAL HIGH (ref 8–23)
CO2: 23 mmol/L (ref 22–32)
Calcium: 9 mg/dL (ref 8.9–10.3)
Chloride: 105 mmol/L (ref 98–111)
Creatinine, Ser: 2.14 mg/dL — ABNORMAL HIGH (ref 0.44–1.00)
GFR calc Af Amer: 23 mL/min — ABNORMAL LOW (ref >60)
GFR calc non Af Amer: 20 mL/min — ABNORMAL LOW (ref >60)
GLUCOSE: 238 mg/dL — AB (ref 70–99)
POTASSIUM: 4.6 mmol/L (ref 3.5–5.1)
SODIUM: 137 mmol/L (ref 135–145)
TOTAL PROTEIN: 6.7 g/dL (ref 6.5–8.1)
Total Bilirubin: 0.6 mg/dL (ref 0.3–1.2)

## 2018-06-10 LAB — CBC
HCT: 23.5 % — ABNORMAL LOW (ref 36.0–46.0)
HEMOGLOBIN: 7.6 g/dL — AB (ref 12.0–15.0)
MCH: 33 pg (ref 26.0–34.0)
MCHC: 32.3 g/dL (ref 30.0–36.0)
MCV: 102.2 fL — ABNORMAL HIGH (ref 80.0–100.0)
Platelets: 328 10*3/uL (ref 150–400)
RBC: 2.3 MIL/uL — AB (ref 3.87–5.11)
RDW: 14.9 % (ref 11.5–15.5)
WBC: 11 10*3/uL — ABNORMAL HIGH (ref 4.0–10.5)
nRBC: 0 % (ref 0.0–0.2)

## 2018-06-10 LAB — ABO/RH: ABO/RH(D): O POS

## 2018-06-10 MED ORDER — SODIUM CHLORIDE 0.9 % IV SOLN
80.0000 mg | Freq: Once | INTRAVENOUS | Status: DC
  Filled 2018-06-10: qty 80

## 2018-06-10 MED ORDER — ACETAMINOPHEN 325 MG PO TABS: 650.0000 mg | ORAL_TABLET | Freq: Four times a day (QID) | ORAL | Status: DC | PRN

## 2018-06-10 MED ORDER — FUROSEMIDE 10 MG/ML IJ SOLN
20.0000 mg | Freq: Once | INTRAMUSCULAR | Status: AC
  Administered 2018-06-11: 20 mg via INTRAVENOUS
  Filled 2018-06-10: qty 4

## 2018-06-10 MED ORDER — ACETAMINOPHEN 650 MG RE SUPP: 650.0000 mg | Freq: Four times a day (QID) | RECTAL | Status: DC | PRN

## 2018-06-10 MED ORDER — ONDANSETRON HCL 4 MG PO TABS: 4.0000 mg | ORAL_TABLET | Freq: Four times a day (QID) | ORAL | Status: DC | PRN

## 2018-06-10 MED ORDER — SODIUM CHLORIDE 0.9 % IV SOLN
INTRAVENOUS | Status: DC
  Administered 2018-06-10: 23:00:00 via INTRAVENOUS

## 2018-06-10 MED ORDER — SODIUM CHLORIDE 0.9 % IV SOLN
8.0000 mg/h | INTRAVENOUS | Status: DC
  Administered 2018-06-10 – 2018-06-12 (×5): 8 mg/h via INTRAVENOUS
  Filled 2018-06-10 (×4): qty 80

## 2018-06-10 MED ORDER — ONDANSETRON HCL 4 MG/2ML IJ SOLN: 4.0000 mg | Freq: Four times a day (QID) | INTRAMUSCULAR | Status: DC | PRN

## 2018-06-10 MED ORDER — SIMVASTATIN 20 MG PO TABS
20.0000 mg | ORAL_TABLET | Freq: Every evening | ORAL | Status: DC
  Administered 2018-06-10 – 2018-06-11 (×2): 20 mg via ORAL
  Filled 2018-06-10 (×2): qty 1

## 2018-06-10 MED ORDER — INSULIN ASPART 100 UNIT/ML ~~LOC~~ SOLN: 0.0000 [IU] | Freq: Every day | SUBCUTANEOUS | Status: DC

## 2018-06-10 MED ORDER — INSULIN ASPART 100 UNIT/ML ~~LOC~~ SOLN
0.0000 [IU] | Freq: Three times a day (TID) | SUBCUTANEOUS | Status: DC
  Administered 2018-06-11: 3 [IU] via SUBCUTANEOUS
  Administered 2018-06-11: 4 [IU] via SUBCUTANEOUS
  Administered 2018-06-12: 3 [IU] via SUBCUTANEOUS
  Filled 2018-06-10 (×3): qty 1

## 2018-06-10 MED ORDER — HYDRALAZINE HCL 20 MG/ML IJ SOLN: 10.0000 mg | INTRAMUSCULAR | Status: DC | PRN

## 2018-06-10 MED ORDER — SODIUM CHLORIDE 0.9 % IV SOLN
Freq: Once | INTRAVENOUS | Status: AC
  Administered 2018-06-10: 20:00:00 via INTRAVENOUS

## 2018-06-10 MED ORDER — PANTOPRAZOLE SODIUM 40 MG IV SOLR: 40.0000 mg | Freq: Two times a day (BID) | INTRAVENOUS | Status: DC

## 2018-06-10 MED ORDER — METOPROLOL TARTRATE 25 MG PO TABS
25.0000 mg | ORAL_TABLET | Freq: Two times a day (BID) | ORAL | Status: DC
  Administered 2018-06-10 – 2018-06-12 (×4): 25 mg via ORAL
  Filled 2018-06-10 (×4): qty 1

## 2018-06-10 MED ORDER — ACETAMINOPHEN 325 MG PO TABS
650.0000 mg | ORAL_TABLET | Freq: Once | ORAL | Status: AC
  Administered 2018-06-10: 650 mg via ORAL
  Filled 2018-06-10: qty 2

## 2018-06-10 MED ORDER — SODIUM CHLORIDE 0.9 % IV SOLN
8.0000 mg/h | INTRAVENOUS | Status: DC
  Filled 2018-06-10: qty 80

## 2018-06-10 MED ORDER — ALLOPURINOL 100 MG PO TABS
100.0000 mg | ORAL_TABLET | Freq: Every day | ORAL | Status: DC
  Administered 2018-06-11 – 2018-06-12 (×2): 100 mg via ORAL
  Filled 2018-06-10 (×2): qty 1

## 2018-06-10 MED ORDER — PANTOPRAZOLE SODIUM 40 MG IV SOLR
80.0000 mg | Freq: Once | INTRAVENOUS | Status: AC
  Administered 2018-06-10: 80 mg via INTRAVENOUS
  Filled 2018-06-10: qty 80

## 2018-06-10 MED ORDER — DIPHENHYDRAMINE HCL 25 MG PO CAPS
25.0000 mg | ORAL_CAPSULE | Freq: Once | ORAL | Status: AC
  Administered 2018-06-10: 25 mg via ORAL
  Filled 2018-06-10: qty 1

## 2018-06-10 MED ORDER — LEVOTHYROXINE SODIUM 50 MCG PO TABS
50.0000 ug | ORAL_TABLET | Freq: Every day | ORAL | Status: DC
  Administered 2018-06-11 – 2018-06-12 (×2): 50 ug via ORAL
  Filled 2018-06-10 (×2): qty 1

## 2018-06-10 MED ORDER — ALBUTEROL SULFATE (2.5 MG/3ML) 0.083% IN NEBU: 2.5000 mg | INHALATION_SOLUTION | Freq: Four times a day (QID) | RESPIRATORY_TRACT | Status: DC

## 2018-06-10 MED ORDER — SODIUM CHLORIDE 0.9% IV SOLUTION
Freq: Once | INTRAVENOUS | Status: AC
  Administered 2018-06-10: 23:00:00 via INTRAVENOUS
  Filled 2018-06-10: qty 250

## 2018-06-10 NOTE — H&P (Signed)
[facility] NAME: Erika Ochoa    MR#:  [id number]  DATE OF BIRTH:  1924/12/20  DATE OF ADMISSION:  06/10/2018  PRIMARY CARE PHYSICIAN: Maryland Pink, MD   REQUESTING/REFERRING PHYSICIAN:   CHIEF COMPLAINT:   Chief Complaint  Patient presents with  . Anemia  . Weakness    HISTORY OF PRESENT ILLNESS: Erika Ochoa  is a 85 y.o. female with a known history per below presenting to emergency room at the request of her primary care provider for worsening anemia, black tarry stools for 1 week, nausea, dizziness, generalized weakness, fatigue, hemoglobin 7.6-last known was 13 several years ago, CT abdomen noted for diverticular disease/moderate hiatal hernia, patient evaluated in the emergency room, multiple family notes are present, complains of abdominal distention without pain, patient is now been admitted for acute melena, acute blood loss anemia, acute kidney injury with chronic kidney disease stage III.  PAST MEDICAL HISTORY:   Past Medical History:  Diagnosis Date  . Abnormal mammogram, unspecified   . Breast screening, unspecified   . Diabetes ([place])   . Hypertension   . Mammographic microcalcification   . Vaginal pessary present     PAST SURGICAL HISTORY:  Past Surgical History:  Procedure Laterality Date  . BREAST BIOPSY Left 2013   stereo calcs  . BREAST EXCISIONAL BIOPSY Left    "years ago"-benign  . SKIN LESION EXCISION      SOCIAL HISTORY:  Social History   Tobacco Use  . Smoking status: Never Smoker  . Smokeless tobacco: Never Used  Substance Use Topics  . Alcohol use: No    Alcohol/week: 0.0 standard drinks    FAMILY HISTORY:  Family History  Problem Relation Age of Onset  . Cancer Mother   . Breast cancer Neg Hx     DRUG ALLERGIES:  Allergies  Allergen Reactions  . Oxycodone-Aspirin Other (See Comments)    Other Reaction: GI Upset  . Augmentin [Amoxicillin-Pot Clavulanate] Nausea Only  . Codeine  Nausea Only  . Etodolac Other (See Comments)  . Iodine Nausea Only  . Levaquin [Levofloxacin In D5w] Nausea Only  . Levofloxacin Nausea Only  . Metronidazole Nausea Only  . Sulfa Antibiotics Nausea Only  . Ultram [Tramadol] Nausea Only    REVIEW OF SYSTEMS:   CONSTITUTIONAL: No fever, +fatigue, weakness.  EYES: No blurred or double vision.  EARS, NOSE, AND THROAT: No tinnitus or ear pain.  RESPIRATORY: No cough, shortness of breath, wheezing or hemoptysis.  CARDIOVASCULAR: No chest pain, orthopnea, edema.  GASTROINTESTINAL: No nausea, vomiting, diarrhea or abdominal pain. + Abdominal distention, black stools/rectal bleeding GENITOURINARY: No dysuria, hematuria.  ENDOCRINE: No polyuria, nocturia,  HEMATOLOGY: No anemia, easy bruising or bleeding SKIN: No rash or lesion. MUSCULOSKELETAL: No joint pain or arthritis.   NEUROLOGIC: No tingling, numbness, weakness.  PSYCHIATRY: No anxiety or depression.   MEDICATIONS AT HOME:  Prior to Admission medications   Medication Sig Start Date End Date Taking? Authorizing Provider  allopurinol (ZYLOPRIM) 100 MG tablet Take 100 mg by mouth daily.     [provider]  aspirin 81 MG tablet Take 81 mg by mouth daily.    [provider]  cyanocobalamin (,VITAMIN B-12,) 1000 MCG/ML injection Inject into the muscle. 10/25/15   [provider]  Dulaglutide (TRULICITY) 1.5 WS/5.6CL SOPN Inject 1.5 mg into the skin every 7 (seven) days.    [provider]  glipiZIDE (GLUCOTROL XL) 2.5 MG 24 hr tablet TAKE  2 TABLETS BY MOUTH EVERY MORNING TAKE 1 TABLET BY MOUTH EVERY EVENING 03/26/18   [provider]  irbesartan-hydrochlorothiazide (AVALIDE) 300-12.5 MG per tablet Take 1 tablet by mouth daily.     [provider]  levothyroxine (SYNTHROID, LEVOTHROID) 50 MCG tablet Take 50 mcg by mouth daily.     [provider]  meloxicam (MOBIC) 15 MG tablet Take 15 mg by mouth daily.    [provider]  metFORMIN (GLUCOPHAGE-XR) 500 MG 24 hr tablet Take 500 mg by mouth daily with breakfast.    [provider]  metoprolol tartrate (LOPRESSOR) 25 MG tablet Take 25 mg by mouth 2 (two) times daily.    [provider]  pioglitazone (ACTOS) 15 MG tablet Take 15 mg by mouth daily. 03/26/18   [provider]  simvastatin (ZOCOR) 20 MG tablet Take 20 mg by mouth every evening.     [provider]  Vitamin D, Ergocalciferol, (DRISDOL) 50000 UNITS CAPS capsule Take 50,000 Units by mouth every 7 (seven) days.     [provider]      PHYSICAL EXAMINATION:   VITAL SIGNS: Blood pressure (!) 167/62, pulse 73, temperature 97.9 F (36.6 C), temperature source Oral, resp. rate 11, height 5\' 7"  (1.702 m), weight 68.5 kg, SpO2 100 %.  GENERAL:  85 y.o.-year-old patient lying in the bed with no acute distress.  EYES: Pupils equal, round, reactive to light and accommodation. No scleral icterus. Extraocular muscles intact.  HEENT: Head atraumatic, normocephalic. Oropharynx and nasopharynx clear.  NECK:  Supple, no jugular venous distention. No thyroid enlargement, no tenderness.  LUNGS: Normal breath sounds bilaterally, no wheezing, rales,rhonchi or crepitation. No use of accessory muscles of respiration.  CARDIOVASCULAR: S1, S2 normal. No murmurs, rubs, or gallops.  ABDOMEN: Mild abdominal distention without tenderness  Bowel sounds present. No organomegaly or mass.  EXTREMITIES: No pedal edema, cyanosis, or clubbing.  NEUROLOGIC: Cranial nerves II through XII are intact. Muscle strength 5/5 in all extremities. Sensation intact. Gait not checked.  PSYCHIATRIC: The patient is alert and oriented x 3.  SKIN: No obvious rash, lesion, or ulcer.   LABORATORY PANEL:   CBC Recent Labs  Lab 06/10/18 1606  WBC 11.0*  HGB 7.6*  HCT 23.5*  PLT 328  MCV 102.2*  MCH 33.0  MCHC 32.3  RDW 14.9    ------------------------------------------------------------------------------------------------------------------  Chemistries  Recent Labs  Lab 06/10/18 1606  NA 137  K 4.6  CL 105  CO2 23  GLUCOSE 238*  BUN 45*  CREATININE 2.14*  CALCIUM 9.0  AST 24  ALT 20  ALKPHOS 71  BILITOT 0.6   ------------------------------------------------------------------------------------------------------------------ estimated creatinine clearance is 16.7 mL/min (A) (by C-G formula based on SCr of 2.14 mg/dL (H)). ------------------------------------------------------------------------------------------------------------------ No results for input(s): TSH, T4TOTAL, T3FREE, THYROIDAB in the last 72 hours.  Invalid input(s): FREET3   Coagulation profile No results for input(s): INR, PROTIME in the last 168 hours. ------------------------------------------------------------------------------------------------------------------- No results for input(s): DDIMER in the last 72 hours. -------------------------------------------------------------------------------------------------------------------  Cardiac Enzymes No results for input(s): CKMB, TROPONINI, MYOGLOBIN in the last 168 hours.  Invalid input(s): CK ------------------------------------------------------------------------------------------------------------------ Invalid input(s): POCBNP  ---------------------------------------------------------------------------------------------------------------  Urinalysis    Component Value Date/Time   COLORURINE Yellow 05/05/2014 1202   APPEARANCEUR Clear 05/05/2014 1202   LABSPEC 1.008 05/05/2014 1202   PHURINE 6.0 05/05/2014 1202   GLUCOSEU Negative 05/05/2014 1202   HGBUR Negative 05/05/2014 1202   BILIRUBINUR Negative 05/05/2014 1202   KETONESUR Negative 05/05/2014 1202   PROTEINUR  Negative 05/05/2014 1202   NITRITE Negative 05/05/2014 1202   LEUKOCYTESUR 1+ 05/05/2014 1202      RADIOLOGY: Ct Abdomen Pelvis Wo Contrast  Result Date: 06/10/2018 CLINICAL DATA:  Pt went to PCP for multiple medical complaints including: Nausea, dark tarry stools, dizziness, abd distention, and increased weakness. No chest pains, abd pain, SOB, urinary symptoms, Vomiting or fever reported. No blood thinners other than a baby aspirin a day with hx of stomach ulcers. EXAM: CT ABDOMEN AND PELVIS WITHOUT CONTRAST TECHNIQUE: Multidetector CT imaging of the abdomen and pelvis was performed following the standard protocol without IV contrast. COMPARISON:  CT, 12/03/2014 FINDINGS: Lower chest: No acute findings.  Moderate hiatal hernia. Hepatobiliary: No focal liver abnormality is seen. No gallstones, gallbladder wall thickening, or biliary dilatation. Pancreas: Unremarkable. No pancreatic ductal dilatation or surrounding inflammatory changes. Spleen: Normal in size without focal abnormality. Adrenals/Urinary Tract: No adrenal masses. Bilateral, left greater than right, renal cortical thinning. No convincing renal masses. No collecting system stones and no hydronephrosis. Ureters normal in course and in caliber. Bladder is unremarkable. Stomach/Bowel: Moderate hiatal hernia. Stomach otherwise unremarkable. Small bowel is normal in caliber. No wall thickening or inflammation. Colon is normal in caliber. There are numerous colonic diverticula. No diverticulitis. No colonic wall thickening or other inflammatory process. Normal appendix visualized. Vascular/Lymphatic: Aortic atherosclerosis. No enlarged abdominal or pelvic lymph nodes. Reproductive: Pessary supports the pelvic floor stable from the prior CT. Uterus is unremarkable. Homogeneous oval right adnexal cystic mass, average Hounsfield units of 2.6. This measures 5.3 x 3.8 x 4.6 cm, without significant change from the prior CT. Other: Minimal fat containing umbilical hernia. No other hernias. No ascites. Musculoskeletal: No fracture or acute finding. No  osteoblastic or osteolytic lesions. Significant degenerative changes noted throughout the visualized spine. IMPRESSION: 1. No acute findings within the abdomen or pelvis. 2. There are numerous colonic diverticula, but no findings of diverticulitis or other bowel inflammatory process. 3. Benign right adnexal cyst that is stable from prior CTs. 4. Moderate hiatal hernia. This appears larger than on the most recent prior CT. 5. Aortic atherosclerosis. Electronically Signed   By: Lajean Manes M.D.   On: 06/10/2018 20:42    EKG: Orders placed or performed during the hospital encounter of 06/10/18  . EKG 12-Lead  . EKG 12-Lead  . ED EKG  . ED EKG    IMPRESSION AND PLAN: *Acute melena *Acute blood loss anemia secondary to above *Acute kidney injury with chronic kidney disease stage III *Chronic diabetes mellitus type 2 *History of peptic ulcer disease per documentation-patient/family deny this *Chronic benign essential hypertension  Admit to regular nursing for bed on our GI bleeding protocol, IV Protonix bolus with subsequent drip, gastroenterology to see, transfuse 2 units packed red blood cells given continued/active bleeding, H&H every 8 hours, CBC daily, n.p.o. after midnight, transfuse as needed, avoid nephrotoxic agents, sliding scale insulin for now, IV fluids for gentle rehydration given advanced age, and continue close medical monitoring  All the records are reviewed and case discussed with ED provider. Management plans discussed with the patient, family and they are in agreement.  CODE STATUS:DNI Code Status History    Date Active Date Inactive Code Status Order ID Comments User Context   12/19/2015 2332 12/20/2015 1844 Full Code [id number]  Lance Coon, MD Inpatient       TOTAL TIME TAKING CARE OF THIS PATIENT: 40 minutes.    Avel Peace Salary M.D on 06/10/2018   Between 7am to 6pm - Pager -  [phone]  After 6pm go to [email or web address] - password [email or web address] [facility]  Office  [phone]  CC: Primary care physician; Maryland Pink, MD   Note: This dictation was prepared with Dragon dictation along with smaller phrase technology. Any transcriptional errors that result from this process are unintentional.

## 2018-06-10 NOTE — ED Notes (Signed)
Pt ambulatory to toilet with unsteady gate

## 2018-06-10 NOTE — ED Notes (Signed)
Pt returned from CT at this time.  

## 2018-06-10 NOTE — Progress Notes (Signed)
Family Meeting Note  Advance Directive:yes  Today a meeting took place with the Patient.  Patient is able to participate  The following clinical team members were present during this meeting:MD  The following were discussed:Patient's diagnosis:gib , Patient's progosis: Unable to determine and Goals for treatment: dni  Additional follow-up to be provided: prn  Time spent during discussion:20 minutes  Gorden Harms, MD

## 2018-06-10 NOTE — ED Triage Notes (Signed)
Pt to ED from PCP after abnormal lab results  hgb 7.7 Neutrophils 8.00 WBC 12.0  Pt went to PCP for multiple medical complaints including: Nausea, dark tarry stools, dizziness, abd distention, and increased weakness. No chest pains, abd pain, SOB, urinary symptoms,  Vomiting or fever reported. No blood thinners other than a baby aspirin a day with hx of stomach ulcers.

## 2018-06-10 NOTE — ED Provider Notes (Signed)
[facility] Emergency Department Provider Note  ____________________________________________   I have reviewed the triage vital signs and the nursing notes.   HISTORY  Chief Complaint Anemia and Weakness   History limited by: Not Limited   HPI Erika Ochoa is a 85 y.o. female who presents to the emergency department today from PCP office because of concern for anemia, weakness and dark stools. The patient states that her symptoms started roughly 1 week ago when she had an episode of vomiting and diarrhea. Since then she has noticed that her stools have been dark. She denies any significant abdominal pain with these symptoms but has noticed and felt like her stomach has become bloated. She denies any fevers. At PCPs office she was found to be anemic to 7.7.  Per medical record review patient has a history of DM  Past Medical History:  Diagnosis Date  . Abnormal mammogram, unspecified   . Breast screening, unspecified   . Diabetes ([place])   . Hypertension   . Mammographic microcalcification   . Vaginal pessary present     Patient Active Problem List   Diagnosis Date Noted  . Cellulitis of leg, left 03/07/2018  . Bilateral edema of lower extremity 03/07/2018  . Bilateral lower extremity pain 03/07/2018  . Cystocele, midline 08/12/2016  . Uterovaginal prolapse, incomplete 08/12/2016  . TIA (transient ischemic attack) 12/19/2015  . CKD (chronic kidney disease), stage III ([place]) 12/19/2015  . A-fib ([place]) 07/11/2014  . Cervical pain 07/11/2014  . CAD (coronary artery disease) 07/11/2014  . Benign essential HTN 07/11/2014  . HLD (hyperlipidemia) 07/11/2014  . Diabetes ([place]) 07/11/2014  . Essential (primary) hypertension 07/11/2014  . Adult hypothyroidism 07/11/2014    Past Surgical History:  Procedure Laterality Date  . BREAST BIOPSY Left 2013   stereo calcs  . BREAST EXCISIONAL BIOPSY Left    "years ago"-benign  . SKIN LESION EXCISION       Prior to Admission medications   Medication Sig Start Date End Date Taking? Authorizing Provider  allopurinol (ZYLOPRIM) 100 MG tablet Take 100 mg by mouth daily.     [provider]  aspirin 81 MG tablet Take 81 mg by mouth daily.    [provider]  cyanocobalamin (,VITAMIN B-12,) 1000 MCG/ML injection Inject into the muscle. 10/25/15   [provider]  doxycycline (VIBRAMYCIN) 100 MG capsule Take 1 capsule (100 mg total) by mouth daily. 03/07/18   Stegmayer, Joelene Millin A, PA-C  Dulaglutide (TRULICITY) 1.5 ZO/1.0RU SOPN Inject 1.5 mg into the skin every 7 (seven) days.    [provider]  irbesartan-hydrochlorothiazide (AVALIDE) 300-12.5 MG per tablet Take 1 tablet by mouth daily.     [provider]  levothyroxine (SYNTHROID, LEVOTHROID) 50 MCG tablet Take 50 mcg by mouth daily.     [provider]  meloxicam (MOBIC) 15 MG tablet Take 15 mg by mouth daily.    [provider]  metFORMIN (GLUCOPHAGE-XR) 500 MG 24 hr tablet Take 500 mg by mouth daily with breakfast.    [provider]  metoprolol tartrate (LOPRESSOR) 25 MG tablet Take 25 mg by mouth 2 (two) times daily.    [provider]  simvastatin (ZOCOR) 20 MG tablet Take 20 mg by mouth every evening.     [provider]  Vitamin D, Ergocalciferol, (DRISDOL) 50000 UNITS CAPS capsule Take 50,000 Units by mouth every 7 (seven) days.     [provider]    Allergies Oxycodone-aspirin; Augmentin [amoxicillin-pot clavulanate];  Codeine; Etodolac; Iodine; Levaquin [levofloxacin in d5w]; Levofloxacin; Metronidazole; Sulfa antibiotics; and Ultram [tramadol]  Family History  Problem Relation Age of Onset  . Cancer Mother   . Breast cancer Neg Hx     Social History Social History   Tobacco Use  . Smoking status: Never Smoker  . Smokeless tobacco: Never Used  Substance Use Topics  . Alcohol use: No    Alcohol/week: 0.0 standard drinks  .  Drug use: No    Review of Systems Constitutional: No fever. Positive for generalized weakness.  Eyes: No visual changes. ENT: No sore throat. Cardiovascular: Denies chest pain. Respiratory: Denies shortness of breath. Gastrointestinal: No abdominal pain.  Positive for abdominal distention, dark stools.    Genitourinary: Negative for dysuria. Musculoskeletal: Negative for back pain. Skin: Negative for rash. Neurological: Negative for headaches, focal weakness or numbness.  ____________________________________________   PHYSICAL EXAM:  VITAL SIGNS: ED Triage Vitals  Enc Vitals Group     BP 06/10/18 1602 (!) 165/45     Pulse Rate 06/10/18 1602 73     Resp 06/10/18 1602 18     Temp 06/10/18 1602 97.9 F (36.6 C)     Temp Source 06/10/18 1602 Oral     SpO2 06/10/18 1602 100 %     Weight 06/10/18 1601 154 lb (69.9 kg)     Height 06/10/18 1601 5\' 7"  (1.702 m)     Head Circumference --      Peak Flow --      Pain Score 06/10/18 1601 0   Constitutional: Alert and oriented.  Eyes: Conjunctivae are normal.  ENT      Head: Normocephalic and atraumatic.      Nose: No congestion/rhinnorhea.      Mouth/Throat: Mucous membranes are moist.      Neck: No stridor. Hematological/Lymphatic/Immunilogical: No cervical lymphadenopathy. Cardiovascular: Normal rate, regular rhythm.  No murmurs, rubs, or gallops. Respiratory: Normal respiratory effort without tachypnea nor retractions. Breath sounds are clear and equal bilaterally. No wheezes/rales/rhonchi. Gastrointestinal: Soft. Slightly distended. Tender to palpation in the left side of the abdomen.  Genitourinary: Deferred Musculoskeletal: Normal range of motion in all extremities. No lower extremity edema. Neurologic:  Normal speech and language. No gross focal neurologic deficits are appreciated.  Skin:  Skin is warm, dry and intact. No rash noted. Psychiatric: Mood and affect are normal. Speech and behavior are normal. Patient exhibits  appropriate insight and judgment.  ____________________________________________    LABS (pertinent positives/negatives)  CMP na 137, k 4.6, glu 238, cr 2.14, ca 9.0 CBC wbc 11.0, hgb 7.6, plt 328  ____________________________________________   EKG  I, Nance Pear, attending physician, personally viewed and interpreted this EKG  EKG Time: 1601 Rate: 72 Rhythm: normal sinus rhythm Axis: normal Intervals: qtc 429 QRS: narrow ST changes: no st elevation Impression: normal ekg   ____________________________________________    RADIOLOGY  CT abd/pel No acute findings, diverticula  ____________________________________________   PROCEDURES  Procedures  ____________________________________________   INITIAL IMPRESSION / ASSESSMENT AND PLAN / ED COURSE  Pertinent labs & imaging results that were available during my care of the patient were reviewed by me and considered in my medical decision making (see chart for details).   Patient presented to the emergency department today because of concerns for anemia, black and tarry stools and weakness.  Patient coming from primary care providers.  Patient was guaiac positive.  Patient did have some left-sided discomfort and complaint of distention so a CT scan was obtained.  Did  not show any acute findings.  Did show some diverticula.  Patient was started on IV Protonix.  Will plan on admission.  Discussed findings plan with patient.  ____________________________________________   FINAL CLINICAL IMPRESSION(S) / ED DIAGNOSES  Final diagnoses:  Gastrointestinal hemorrhage, unspecified gastrointestinal hemorrhage type  Anemia, unspecified type  Weakness     Note: This dictation was prepared with Dragon dictation. Any transcriptional errors that result from this process are unintentional     Nance Pear, MD 06/10/18 2110

## 2018-06-10 NOTE — ED Notes (Signed)
Patient transported to CT 

## 2018-06-11 DIAGNOSIS — D62 Acute posthemorrhagic anemia: Secondary | ICD-10-CM

## 2018-06-11 DIAGNOSIS — K921 Melena: Secondary | ICD-10-CM

## 2018-06-11 LAB — GLUCOSE, CAPILLARY
GLUCOSE-CAPILLARY: 109 mg/dL — AB (ref 70–99)
Glucose-Capillary: 117 mg/dL — ABNORMAL HIGH (ref 70–99)
Glucose-Capillary: 124 mg/dL — ABNORMAL HIGH (ref 70–99)
Glucose-Capillary: 177 mg/dL — ABNORMAL HIGH (ref 70–99)
Glucose-Capillary: 70 mg/dL (ref 70–99)
Glucose-Capillary: 143 mg/dL — ABNORMAL HIGH (ref 70–99)

## 2018-06-11 LAB — CBC
HCT: 26.3 % — ABNORMAL LOW (ref 36.0–46.0)
HEMOGLOBIN: 8.7 g/dL — AB (ref 12.0–15.0)
MCH: 31.9 pg (ref 26.0–34.0)
MCHC: 33.1 g/dL (ref 30.0–36.0)
MCV: 96.3 fL (ref 80.0–100.0)
NRBC: 0 % (ref 0.0–0.2)
Platelets: 299 10*3/uL (ref 150–400)
RBC: 2.73 MIL/uL — ABNORMAL LOW (ref 3.87–5.11)
RDW: 16.1 % — ABNORMAL HIGH (ref 11.5–15.5)
WBC: 10.4 10*3/uL (ref 4.0–10.5)

## 2018-06-11 LAB — BASIC METABOLIC PANEL
Anion gap: 6 (ref 5–15)
BUN: 41 mg/dL — ABNORMAL HIGH (ref 8–23)
CO2: 23 mmol/L (ref 22–32)
Calcium: 8.4 mg/dL — ABNORMAL LOW (ref 8.9–10.3)
Chloride: 110 mmol/L (ref 98–111)
Creatinine, Ser: 1.93 mg/dL — ABNORMAL HIGH (ref 0.44–1.00)
GFR, EST AFRICAN AMERICAN: 26 mL/min — AB (ref >60)
GFR, EST NON AFRICAN AMERICAN: 22 mL/min — AB (ref >60)
Glucose, Bld: 115 mg/dL — ABNORMAL HIGH (ref 70–99)
POTASSIUM: 4 mmol/L (ref 3.5–5.1)
Sodium: 139 mmol/L (ref 135–145)

## 2018-06-11 MED ORDER — SODIUM CHLORIDE 0.9 % IV SOLN: INTRAVENOUS | Status: DC

## 2018-06-11 MED ORDER — ALBUTEROL SULFATE (2.5 MG/3ML) 0.083% IN NEBU: 2.5000 mg | INHALATION_SOLUTION | RESPIRATORY_TRACT | Status: DC | PRN

## 2018-06-11 NOTE — Progress Notes (Signed)
[facility] [place] NAME: Erika Ochoa    MR#:  [id number]  DATE OF BIRTH:  [birth year]  SUBJECTIVE:   Feeling better this morning.  States she had a bowel movement this morning, but it was not black and tarry.  No bright red blood per rectum.  She denies any dizziness, palpitations, chest pain, shortness of breath.  REVIEW OF SYSTEMS:  Review of Systems  Constitutional: Negative for chills and fever.  HENT: Negative for congestion and sore throat.   Eyes: Negative for blurred vision and double vision.  Respiratory: Negative for cough and shortness of breath.   Cardiovascular: Negative for chest pain, palpitations and leg swelling.  Gastrointestinal: Negative for abdominal pain, nausea and vomiting.  Genitourinary: Negative for dysuria and urgency.  Musculoskeletal: Negative for back pain and neck pain.  Neurological: Negative for dizziness and headaches.  Psychiatric/Behavioral: Negative for depression. The patient is not nervous/anxious.    DRUG ALLERGIES:   Allergies  Allergen Reactions  . Oxycodone-Aspirin Other (See Comments)    Other Reaction: GI Upset (Per pt she does take aspirin 81 mg daily  . Augmentin [Amoxicillin-Pot Clavulanate] Nausea Only  . Codeine Nausea Only  . Etodolac Other (See Comments)  . Iodine Nausea Only  . Levaquin [Levofloxacin In D5w] Nausea Only  . Levofloxacin Nausea Only  . Metronidazole Nausea Only  . Sulfa Antibiotics Nausea Only  . Ultram [Tramadol] Nausea Only   VITALS:  Blood pressure (!) 145/48, pulse 70, temperature 97.9 F (36.6 C), temperature source Oral, resp. rate 18, height 5\' 7"  (1.702 m), weight 69.2 kg, SpO2 98 %. PHYSICAL EXAMINATION:  Physical Exam  GENERAL:  85 y.o.-year-old patient lying in the bed with no acute distress.  EYES: Pupils equal, round, reactive to light and accommodation. No scleral icterus. Extraocular muscles intact.  HEENT: Head atraumatic, normocephalic.  Oropharynx and nasopharynx clear.  NECK:  Supple, no jugular venous distention. No thyroid enlargement, no tenderness.  LUNGS: Normal breath sounds bilaterally, no wheezing, rales,rhonchi or crepitation. No use of accessory muscles of respiration.  CARDIOVASCULAR: RRR, S1, S2 normal. No murmurs, rubs, or gallops.  ABDOMEN: No abdominal tenderness or distention, bowel sounds present. No organomegaly or mass.  EXTREMITIES: No pedal edema, cyanosis, or clubbing.  NEUROLOGIC: Cranial nerves II through XII are intact. Muscle strength 5/5 in all extremities. Sensation intact. Gait not checked.  PSYCHIATRIC: The patient is alert and oriented x 3.  SKIN: No obvious rash, lesion, or ulcer.  LABORATORY PANEL:  Female CBC Recent Labs  Lab 06/11/18 0419  WBC 10.4  HGB 8.7*  HCT 26.3*  PLT 299   ------------------------------------------------------------------------------------------------------------------ Chemistries  Recent Labs  Lab 06/10/18 1606 06/11/18 0419  NA 137 139  K 4.6 4.0  CL 105 110  CO2 23 23  GLUCOSE 238* 115*  BUN 45* 41*  CREATININE 2.14* 1.93*  CALCIUM 9.0 8.4*  AST 24  --   ALT 20  --   ALKPHOS 71  --   BILITOT 0.6  --    RADIOLOGY:  Ct Abdomen Pelvis Wo Contrast  Result Date: 06/10/2018 CLINICAL DATA:  Pt went to PCP for multiple medical complaints including: Nausea, dark tarry stools, dizziness, abd distention, and increased weakness. No chest pains, abd pain, SOB, urinary symptoms, Vomiting or fever reported. No blood thinners other than a baby aspirin a day with hx of stomach ulcers. EXAM: CT ABDOMEN AND PELVIS WITHOUT CONTRAST TECHNIQUE: Multidetector CT imaging of the abdomen and pelvis  was performed following the standard protocol without IV contrast. COMPARISON:  CT, 12/03/2014 FINDINGS: Lower chest: No acute findings.  Moderate hiatal hernia. Hepatobiliary: No focal liver abnormality is seen. No gallstones, gallbladder wall thickening, or biliary dilatation.  Pancreas: Unremarkable. No pancreatic ductal dilatation or surrounding inflammatory changes. Spleen: Normal in size without focal abnormality. Adrenals/Urinary Tract: No adrenal masses. Bilateral, left greater than right, renal cortical thinning. No convincing renal masses. No collecting system stones and no hydronephrosis. Ureters normal in course and in caliber. Bladder is unremarkable. Stomach/Bowel: Moderate hiatal hernia. Stomach otherwise unremarkable. Small bowel is normal in caliber. No wall thickening or inflammation. Colon is normal in caliber. There are numerous colonic diverticula. No diverticulitis. No colonic wall thickening or other inflammatory process. Normal appendix visualized. Vascular/Lymphatic: Aortic atherosclerosis. No enlarged abdominal or pelvic lymph nodes. Reproductive: Pessary supports the pelvic floor stable from the prior CT. Uterus is unremarkable. Homogeneous oval right adnexal cystic mass, average Hounsfield units of 2.6. This measures 5.3 x 3.8 x 4.6 cm, without significant change from the prior CT. Other: Minimal fat containing umbilical hernia. No other hernias. No ascites. Musculoskeletal: No fracture or acute finding. No osteoblastic or osteolytic lesions. Significant degenerative changes noted throughout the visualized spine. IMPRESSION: 1. No acute findings within the abdomen or pelvis. 2. There are numerous colonic diverticula, but no findings of diverticulitis or other bowel inflammatory process. 3. Benign right adnexal cyst that is stable from prior CTs. 4. Moderate hiatal hernia. This appears larger than on the most recent prior CT. 5. Aortic atherosclerosis. Electronically Signed   By: Lajean Manes M.D.   On: 06/10/2018 20:42   ASSESSMENT AND PLAN:   Acute melena- resolved. Had a normal BM this morning.  Likely due to PUD versus gastritis. -GI following- plan for EGD tomorrow -Continue IV PPI -Clear liquid diet for today, then n.p.o. at midnight -Avoid  NSAIDs  Acute blood loss anemia- secondary to above.  Hemoglobin stable after 2 units PRBCs. -Monitor hemoglobin closely -Will transfuse as needed  Acute kidney injury and CKD 3-creatinine improving. -Continue IV fluids -Recheck creatinine in the morning -Avoid nephrotoxic agents  Type 2 diabetes-blood sugars well controlled -Continue SSI  Hypertension- BP is normal -Continue home BP meds  All the records are reviewed and case discussed with Care Management/Social Worker. Management plans discussed with the patient, family and they are in agreement.  CODE STATUS: Prior  TOTAL TIME TAKING CARE OF THIS PATIENT: 35 minutes.   More than 50% of the time was spent in counseling/coordination of care: YES  POSSIBLE D/C IN 1-2 DAYS, DEPENDING ON CLINICAL CONDITION.   Berna Spare Mayo M.D on 06/11/2018 at 2:11 PM  Between 7am to 6pm - Pager [phone]  After 6pm go to [email or web address] - Proofreader  [facility]  Office  [phone]  CC: Primary care physician; [place] Pink, MD  Note: This dictation was prepared with Dragon dictation along with smaller phrase technology. Any transcriptional errors that result from this process are unintentional.

## 2018-06-11 NOTE — Consult Note (Signed)
Vonda Antigua, MD [address], [place], [place], [place], [address] [id number] [place], [place], [place], [address] Phone: [phone]  Fax: [phone]  Consultation  Referring Provider:     Dr. salary Primary Care Physician:  [place] Pink, MD Reason for Consultation:     Melena  Date of Admission:  06/10/2018 Date of Consultation:  06/11/2018         HPI:   Erika Ochoa is a 85 y.o. female presents to the ER due to anemia.  She visited her primary care provider yesterday and reported an episode of emesis, with no hematemesis at the time, and this led to blood work which showed acute anemia and she was sent to the ER.  Patient reports noticing melena about a week ago and has no prior history of melena or GI ulcers.  Unclear if the melena has continued since then, but patient has not had a bowel movement yesterday or today.  Patient is hemodynamically stable.  There is Mobic listed on her medication list but unclear if she is taking it.  Denies any over-the-counter medication use or any other NSAIDs.  Aspirin daily is also listed on her medication list.  No prior EGDs.  Patient had a colonoscopy in 2003 for screening and one rectal polyp was removed.  Diverticulosis was noted.  Pathology report from this is not available.  Patient was 77 at the time.  Past Medical History:  Diagnosis Date  . Abnormal mammogram, unspecified   . Breast screening, unspecified   . Diabetes ([place])   . Hypertension   . Mammographic microcalcification   . Vaginal pessary present     Past Surgical History:  Procedure Laterality Date  . BREAST BIOPSY Left 2013   stereo calcs  . BREAST EXCISIONAL BIOPSY Left    "years ago"-benign  . SKIN LESION EXCISION      Prior to Admission medications   Medication Sig Start Date End Date Taking? Authorizing Provider  allopurinol (ZYLOPRIM) 100 MG tablet Take 100 mg by mouth daily.    Yes [provider]  aspirin 81 MG tablet Take 81 mg by  mouth daily.   Yes [provider]  Dulaglutide (TRULICITY) 1.5 UX/3.2TF SOPN Inject 0.75 mg into the skin every 7 (seven) days.    Yes [provider]  irbesartan-hydrochlorothiazide (AVALIDE) 300-12.5 MG per tablet Take 1 tablet by mouth daily.    Yes [provider]  levothyroxine (SYNTHROID, LEVOTHROID) 50 MCG tablet Take 50 mcg by mouth daily.    Yes [provider]  meloxicam (MOBIC) 15 MG tablet Take 15 mg by mouth daily.   Yes [provider]  metFORMIN (GLUCOPHAGE) 500 MG tablet Take 500 mg by mouth 2 (two) times daily with a meal.   Yes [provider]  metoprolol tartrate (LOPRESSOR) 25 MG tablet Take 25 mg by mouth 2 (two) times daily.   Yes [provider]  simvastatin (ZOCOR) 20 MG tablet Take 20 mg by mouth every evening.    Yes [provider]  sitaGLIPtin-metformin (JANUMET) 50-500 MG tablet Take 1 tablet by mouth 2 (two) times daily with a meal.   Yes [provider]  Vitamin D, Ergocalciferol, (DRISDOL) 50000 UNITS CAPS capsule Take 50,000 Units by mouth every 7 (seven) days.    Yes [provider]  cyanocobalamin (,VITAMIN B-12,) 1000 MCG/ML injection Inject into the muscle. 10/25/15   [provider]  glipiZIDE (GLUCOTROL XL) 2.5 MG 24 hr tablet TAKE  2 TABLETS BY MOUTH EVERY MORNING TAKE 1 TABLET BY MOUTH EVERY EVENING 03/26/18   [provider]  metFORMIN (GLUCOPHAGE-XR) 500 MG 24 hr tablet Take 500 mg by mouth daily with breakfast.    [provider]  pioglitazone (ACTOS) 15 MG tablet Take 15 mg by mouth daily. 03/26/18   [provider]    Family History  Problem Relation Age of Onset  . Cancer Mother   . Breast cancer Neg Hx      Social History   Tobacco Use  . Smoking status: Never Smoker  . Smokeless tobacco: Never Used  Substance Use Topics  . Alcohol use: No    Alcohol/week: 0.0 standard drinks  . Drug use: No    Allergies as of  06/10/2018 - Review Complete 06/10/2018  Allergen Reaction Noted  . Oxycodone-aspirin Other (See Comments) 07/11/2014  . Augmentin [amoxicillin-pot clavulanate] Nausea Only 05/21/2012  . Codeine Nausea Only 05/21/2012  . Etodolac Other (See Comments) 07/11/2014  . Iodine Nausea Only 05/21/2012  . Levaquin [levofloxacin in d5w] Nausea Only 05/21/2012  . Levofloxacin Nausea Only 07/11/2014  . Metronidazole Nausea Only 12/19/2015  . Sulfa antibiotics Nausea Only 05/21/2012  . Ultram [tramadol] Nausea Only 05/21/2012    Review of Systems:    All systems reviewed and negative except where noted in HPI.   Physical Exam:  Vital signs in last 24 hours: Vitals:   06/10/18 2350 06/10/18 2355 06/11/18 0259 06/11/18 0753  BP: (!) 170/54  (!) 130/54 (!) 145/48  Pulse: 74 76 68 70  Resp: 16 16 18    Temp: 98.3 F (36.8 C)  98.3 F (36.8 C) 97.9 F (36.6 C)  TempSrc: Oral  Oral Oral  SpO2: 95% 96% 94% 98%  Weight:      Height:       Last BM Date: 06/10/18(tarry stools) General:   Pleasant, cooperative in NAD Head:  Normocephalic and atraumatic. Eyes:   No icterus.   Conjunctiva pink. PERRLA. Ears:  Normal auditory acuity. Neck:  Supple; no masses or thyroidomegaly Lungs: Respirations even and unlabored. Lungs clear to auscultation bilaterally.   No wheezes, crackles, or rhonchi.  Abdomen:  Soft, nondistended, nontender. Normal bowel sounds. No appreciable masses or hepatomegaly.  No rebound or guarding.  Neurologic:  Alert and oriented x3;  grossly normal neurologically. Skin:  Intact without significant lesions or rashes. Cervical Nodes:  No significant cervical adenopathy. Psych:  Alert and cooperative. Normal affect.  LAB RESULTS: Recent Labs    06/10/18 1606 06/11/18 0419  WBC 11.0* 10.4  HGB 7.6* 8.7*  HCT 23.5* 26.3*  PLT 328 299   BMET Recent Labs    06/10/18 1606 06/11/18 0419  NA 137 139  K 4.6 4.0  CL 105 110  CO2 23 23  GLUCOSE 238* 115*  BUN 45* 41*    CREATININE 2.14* 1.93*  CALCIUM 9.0 8.4*   LFT Recent Labs    06/10/18 1606  PROT 6.7  ALBUMIN 3.6  AST 24  ALT 20  ALKPHOS 71  BILITOT 0.6   PT/INR No results for input(s): LABPROT, INR in the last 72 hours.  STUDIES: Ct Abdomen Pelvis Wo Contrast  Result Date: 06/10/2018 CLINICAL DATA:  Pt went to PCP for multiple medical complaints including: Nausea, dark tarry stools, dizziness, abd distention, and increased weakness. No chest pains, abd pain, SOB, urinary symptoms, Vomiting or fever reported. No blood thinners other than a baby aspirin a day with hx of stomach ulcers. EXAM: CT ABDOMEN  AND PELVIS WITHOUT CONTRAST TECHNIQUE: Multidetector CT imaging of the abdomen and pelvis was performed following the standard protocol without IV contrast. COMPARISON:  CT, 12/03/2014 FINDINGS: Lower chest: No acute findings.  Moderate hiatal hernia. Hepatobiliary: No focal liver abnormality is seen. No gallstones, gallbladder wall thickening, or biliary dilatation. Pancreas: Unremarkable. No pancreatic ductal dilatation or surrounding inflammatory changes. Spleen: Normal in size without focal abnormality. Adrenals/Urinary Tract: No adrenal masses. Bilateral, left greater than right, renal cortical thinning. No convincing renal masses. No collecting system stones and no hydronephrosis. Ureters normal in course and in caliber. Bladder is unremarkable. Stomach/Bowel: Moderate hiatal hernia. Stomach otherwise unremarkable. Small bowel is normal in caliber. No wall thickening or inflammation. Colon is normal in caliber. There are numerous colonic diverticula. No diverticulitis. No colonic wall thickening or other inflammatory process. Normal appendix visualized. Vascular/Lymphatic: Aortic atherosclerosis. No enlarged abdominal or pelvic lymph nodes. Reproductive: Pessary supports the pelvic floor stable from the prior CT. Uterus is unremarkable. Homogeneous oval right adnexal cystic mass, average Hounsfield units  of 2.6. This measures 5.3 x 3.8 x 4.6 cm, without significant change from the prior CT. Other: Minimal fat containing umbilical hernia. No other hernias. No ascites. Musculoskeletal: No fracture or acute finding. No osteoblastic or osteolytic lesions. Significant degenerative changes noted throughout the visualized spine. IMPRESSION: 1. No acute findings within the abdomen or pelvis. 2. There are numerous colonic diverticula, but no findings of diverticulitis or other bowel inflammatory process. 3. Benign right adnexal cyst that is stable from prior CTs. 4. Moderate hiatal hernia. This appears larger than on the most recent prior CT. 5. Aortic atherosclerosis. Electronically Signed   By: Lajean Manes M.D.   On: 06/10/2018 20:42      Impression / Plan:   Erika Ochoa is a 85 y.o. y/o female with possible meloxicam use at home, and aspirin use at home with melena and anemia  Patient is hemodynamically stable Patient will need an EGD for evaluation of etiology of anemia Possible peptic ulcer disease from NSAID use is highest on differential Other etiologies can include AVM, esophagitis, Dieulafoy lesion  Given hemodynamic stability, and resolution of bleeding at this time, with no bowel movements in the last 48 hours, most likely etiology is peptic ulcer disease, which is clinically stopped bleeding  Due to anesthesia availability, EGD scheduled for tomorrow  However, if patient has any hemodynamic changes, or further signs of active GI bleeding, please page GI for evaluation for EGD sooner than tomorrow  Currently patient is hemodynamically stable, hemoglobin stable since yesterday, therefore no indication for emergent EGD today.  PPI IV twice daily  Continue serial CBCs and transfuse PRN Avoid NSAIDs Maintain 2 large-bore IV lines Please page GI with any acute hemodynamic changes, or signs of active GI bleeding  I have discussed alternative options, risks & benefits,  which include, but  are not limited to, bleeding, infection, perforation,respiratory complication & drug reaction.  The patient agrees with this plan & written consent will be obtained.    Clear liquid diet okay today N.p.o. past midnight  Thank you for involving me in the care of this patient.      LOS: 1 day   Virgel Manifold, MD  06/11/2018, 9:10 AM

## 2018-06-11 NOTE — Progress Notes (Signed)
Per MD. Madaline Brilliant to hold 2nd unit of blood. Will continue to monitor.

## 2018-06-12 ENCOUNTER — Inpatient Hospital Stay: Payer: Medicare HMO | Admitting: Anesthesiology

## 2018-06-12 ENCOUNTER — Encounter
Admission: EM | Disposition: A | Discharge status: Home / Self Care | Payer: Self-pay | Source: Home / Self Care | Attending: Internal Medicine

## 2018-06-12 ENCOUNTER — Encounter: Payer: Self-pay | Admitting: Anesthesiology

## 2018-06-12 DIAGNOSIS — K253 Acute gastric ulcer without hemorrhage or perforation: Secondary | ICD-10-CM

## 2018-06-12 DIAGNOSIS — K449 Diaphragmatic hernia without obstruction or gangrene: Secondary | ICD-10-CM

## 2018-06-12 HISTORY — PX: ESOPHAGOGASTRODUODENOSCOPY: SHX5428

## 2018-06-12 LAB — GLUCOSE, CAPILLARY
Glucose-Capillary: 129 mg/dL — ABNORMAL HIGH (ref 70–99)
Glucose-Capillary: 140 mg/dL — ABNORMAL HIGH (ref 70–99)

## 2018-06-12 LAB — BASIC METABOLIC PANEL
Anion gap: 8 (ref 5–15)
BUN: 33 mg/dL — ABNORMAL HIGH (ref 8–23)
CO2: 21 mmol/L — ABNORMAL LOW (ref 22–32)
Calcium: 8.5 mg/dL — ABNORMAL LOW (ref 8.9–10.3)
Chloride: 109 mmol/L (ref 98–111)
Creatinine, Ser: 1.79 mg/dL — ABNORMAL HIGH (ref 0.44–1.00)
GFR calc Af Amer: 28 mL/min — ABNORMAL LOW (ref >60)
GFR calc non Af Amer: 24 mL/min — ABNORMAL LOW (ref >60)
Glucose, Bld: 140 mg/dL — ABNORMAL HIGH (ref 70–99)
Potassium: 3.9 mmol/L (ref 3.5–5.1)
Sodium: 138 mmol/L (ref 135–145)

## 2018-06-12 LAB — CBC
HCT: 26.2 % — ABNORMAL LOW (ref 36.0–46.0)
Hemoglobin: 8.6 g/dL — ABNORMAL LOW (ref 12.0–15.0)
MCH: 31.9 pg (ref 26.0–34.0)
MCHC: 32.8 g/dL (ref 30.0–36.0)
MCV: 97 fL (ref 80.0–100.0)
Platelets: 322 10*3/uL (ref 150–400)
RBC: 2.7 MIL/uL — ABNORMAL LOW (ref 3.87–5.11)
RDW: 16.7 % — ABNORMAL HIGH (ref 11.5–15.5)
WBC: 9.9 10*3/uL (ref 4.0–10.5)
nRBC: 0 % (ref 0.0–0.2)

## 2018-06-12 SURGERY — EGD (ESOPHAGOGASTRODUODENOSCOPY)
Anesthesia: General | Laterality: Left

## 2018-06-12 MED ORDER — PROPOFOL 500 MG/50ML IV EMUL
INTRAVENOUS | Status: DC | PRN
  Administered 2018-06-12: 150 ug/kg/min via INTRAVENOUS

## 2018-06-12 MED ORDER — PROPOFOL 500 MG/50ML IV EMUL
INTRAVENOUS | Status: AC
  Filled 2018-06-12: qty 50

## 2018-06-12 MED ORDER — PROPOFOL 10 MG/ML IV BOLUS
INTRAVENOUS | Status: DC | PRN
  Administered 2018-06-12: 20 mg via INTRAVENOUS
  Administered 2018-06-12: 60 mg via INTRAVENOUS

## 2018-06-12 MED ORDER — GLYCOPYRROLATE 0.2 MG/ML IJ SOLN
INTRAMUSCULAR | Status: AC
  Filled 2018-06-12: qty 1

## 2018-06-12 MED ORDER — LIDOCAINE 2% (20 MG/ML) 5 ML SYRINGE
INTRAMUSCULAR | Status: DC | PRN
  Administered 2018-06-12: 100 mg via INTRAVENOUS

## 2018-06-12 MED ORDER — PANTOPRAZOLE SODIUM 40 MG PO TBEC: 40.0000 mg | DELAYED_RELEASE_TABLET | Freq: Two times a day (BID) | ORAL | 0 refills | Status: DC

## 2018-06-12 MED ORDER — LIDOCAINE HCL (PF) 2 % IJ SOLN
INTRAMUSCULAR | Status: AC
  Filled 2018-06-12: qty 10

## 2018-06-12 MED ORDER — SODIUM CHLORIDE 0.9 % IV SOLN
INTRAVENOUS | Status: DC
  Administered 2018-06-12: 09:00:00 via INTRAVENOUS

## 2018-06-12 MED ORDER — GLYCOPYRROLATE 0.2 MG/ML IJ SOLN
INTRAMUSCULAR | Status: DC | PRN
  Administered 2018-06-12: 0.2 mg via INTRAVENOUS

## 2018-06-12 NOTE — Transfer of Care (Signed)
Immediate Anesthesia Transfer of Care Note  Patient: Erika Ochoa  Procedure(s) Performed: ESOPHAGOGASTRODUODENOSCOPY (EGD) (Left )  Patient Location: PACU  Anesthesia Type:General  Level of Consciousness: awake  Airway & Oxygen Therapy: Patient connected to nasal cannula oxygen  Post-op Assessment: Post -op Vital signs reviewed and stable  Post vital signs: stable  Last Vitals:  Vitals Value Taken Time  BP    Temp    Pulse    Resp    SpO2      Last Pain:  Vitals:   06/11/18 2310  TempSrc: Oral  PainSc:          Complications: No apparent anesthesia complications

## 2018-06-12 NOTE — Progress Notes (Signed)
Nsg Discharge Note  Admit Date:  06/10/2018 Discharge date: 06/12/2018   Erika Ochoa to be D/C'd Home per MD order.  AVS completed.  Copy for chart, and copy for patient signed, and dated. Patient/caregiver able to verbalize understanding.  Discharge Medication: Allergies as of 06/12/2018      Reactions   Oxycodone-aspirin Other (See Comments)   Other Reaction: GI Upset (Per pt she does take aspirin 81 mg daily   Augmentin [amoxicillin-pot Clavulanate] Nausea Only   Codeine Nausea Only   Etodolac Other (See Comments)   Iodine Nausea Only   Levaquin [levofloxacin In D5w] Nausea Only   Levofloxacin Nausea Only   Metronidazole Nausea Only   Sulfa Antibiotics Nausea Only   Ultram [tramadol] Nausea Only      Medication List    STOP taking these medications   aspirin 81 MG tablet   glipiZIDE 2.5 MG 24 hr tablet Commonly known as:  GLUCOTROL XL   irbesartan-hydrochlorothiazide 300-12.5 MG tablet Commonly known as:  AVALIDE   meloxicam 15 MG tablet Commonly known as:  MOBIC   metFORMIN 500 MG 24 hr tablet Commonly known as:  GLUCOPHAGE-XR   metFORMIN 500 MG tablet Commonly known as:  GLUCOPHAGE   sitaGLIPtin-metformin 50-500 MG tablet Commonly known as:  JANUMET     TAKE these medications   allopurinol 100 MG tablet Commonly known as:  ZYLOPRIM Take 100 mg by mouth daily.   cyanocobalamin 1000 MCG/ML injection Commonly known as:  (VITAMIN B-12) Inject into the muscle.   levothyroxine 50 MCG tablet Commonly known as:  SYNTHROID, LEVOTHROID Take 50 mcg by mouth daily.   metoprolol tartrate 25 MG tablet Commonly known as:  LOPRESSOR Take 25 mg by mouth 2 (two) times daily.   pantoprazole 40 MG tablet Commonly known as:  PROTONIX Take 1 tablet (40 mg total) by mouth 2 (two) times daily.   pioglitazone 15 MG tablet Commonly known as:  ACTOS Take 15 mg by mouth daily.   simvastatin 20 MG tablet Commonly known as:  ZOCOR Take 20 mg by mouth every evening.    TRULICITY 1.5 HK/7.4QV Sopn Generic drug:  Dulaglutide Inject 0.75 mg into the skin every 7 (seven) days.   Vitamin D (Ergocalciferol) 1.25 MG (50000 UT) Caps capsule Commonly known as:  DRISDOL Take 50,000 Units by mouth every 7 (seven) days.       Discharge Assessment: Vitals:   06/12/18 1000 06/12/18 1029  BP: (!) 147/65 (!) 151/60  Pulse: 95 93  Resp: 18 18  Temp:  97.9 F (36.6 C)  SpO2: 99% 97%   Skin clean, dry and intact without evidence of skin break down, no evidence of skin tears noted. IV catheter discontinued intact. Site without signs and symptoms of complications - no redness or edema noted at insertion site, patient denies c/o pain - only slight tenderness at site.  Dressing with slight pressure applied.  D/c Instructions-Education: Discharge instructions given to patient/family with verbalized understanding. D/c education completed with patient/family including follow up instructions, medication list, d/c activities limitations if indicated, with other d/c instructions as indicated by MD - patient able to verbalize understanding, all questions fully answered. Patient instructed to return to ED, call 911, or call MD for any changes in condition.  Patient escorted via [place], and D/C home via private auto.  Eda Keys, RN 06/12/2018 2:55 PM

## 2018-06-12 NOTE — Plan of Care (Signed)

## 2018-06-12 NOTE — Anesthesia Post-op Follow-up Note (Signed)
Anesthesia QCDR form completed.        

## 2018-06-12 NOTE — Progress Notes (Signed)
Report called to floor, given to [employer].

## 2018-06-12 NOTE — Discharge Summary (Signed)
Erika Ochoa NAME: Erika Ochoa    MR#:  [id number]  DATE OF BIRTH:  Feb 25, 1925  DATE OF ADMISSION:  06/10/2018   ADMITTING PHYSICIAN: Gorden Harms, MD  DATE OF DISCHARGE: 06/12/18  PRIMARY CARE PHYSICIAN: Maryland Pink, MD   ADMISSION DIAGNOSIS:  Weakness [R53.1] Gastrointestinal hemorrhage, unspecified gastrointestinal hemorrhage type [K92.2] Anemia, unspecified type [D64.9] DISCHARGE DIAGNOSIS:  Active Problems:   GIB (gastrointestinal bleeding)  SECONDARY DIAGNOSIS:   Past Medical History:  Diagnosis Date  . Abnormal mammogram, unspecified   . Breast screening, unspecified   . Diabetes ([place])   . Hypertension   . Mammographic microcalcification   . Vaginal pessary present    HOSPITAL COURSE:   Erika Ochoa is a 85 year old female who presented to the ED at the request of her primary care doctor for worsening anemia, melena, nausea, dizziness, weakness, fatigue.  In the ED, hemoglobin was 7.6.  CT abdomen pelvis showed a moderate hiatal hernia and diverticular disease.  She was admitted for further management.  Gastric ulcers- this was likely the cause of her melena. -EGD 06/12/2018 with 2 nonbleeding gastric ulcers -Initially treated with IV PPI and then transitioned to Protonix p.o. twice daily per GI recommendations -H. pylori serologies pending at the time of discharge -Needs to follow-up with GI in 4 weeks -Patient counseled that she should avoid NSAIDs  Acute blood loss anemia- secondary to above. -Received 2 units PRBCs this admission -Hemoglobin stable for 48 hours prior to discharge -Aspirin held on discharge.  Can be restarted in the future by PCP.  Acute kidney injury in CKD 3 -Creatinine improved with IV fluids -The following meds were held on discharge: Glipizide, irbesartan-HCTZ, metformin, Janumet, meloxicam -Recheck creatinine as an outpatient and can restart meds as able  Type 2 diabetes-blood  sugars well-controlled -Glipizide, metformin, Janumet all held on discharge due to renal function  Hypertension- BPs well-controlled this admission -Irbesartan-HCTZ held on discharge due to renal function  DISCHARGE CONDITIONS:  Gastric ulcers Acute blood loss anemia AKI in CKD 3 Type 2 diabetes Hypertension CONSULTS OBTAINED:  Treatment Team:  Virgel Manifold, MD DRUG ALLERGIES:   Allergies  Allergen Reactions  . Oxycodone-Aspirin Other (See Comments)    Other Reaction: GI Upset (Per pt she does take aspirin 81 mg daily  . Augmentin [Amoxicillin-Pot Clavulanate] Nausea Only  . Codeine Nausea Only  . Etodolac Other (See Comments)  . Iodine Nausea Only  . Levaquin [Levofloxacin In D5w] Nausea Only  . Levofloxacin Nausea Only  . Metronidazole Nausea Only  . Sulfa Antibiotics Nausea Only  . Ultram [Tramadol] Nausea Only   DISCHARGE MEDICATIONS:   Allergies as of 06/12/2018      Reactions   Oxycodone-aspirin Other (See Comments)   Other Reaction: GI Upset (Per pt she does take aspirin 81 mg daily   Augmentin [amoxicillin-pot Clavulanate] Nausea Only   Codeine Nausea Only   Etodolac Other (See Comments)   Iodine Nausea Only   Levaquin [levofloxacin In D5w] Nausea Only   Levofloxacin Nausea Only   Metronidazole Nausea Only   Sulfa Antibiotics Nausea Only   Ultram [tramadol] Nausea Only      Medication List    STOP taking these medications   aspirin 81 MG tablet   glipiZIDE 2.5 MG 24 hr tablet Commonly known as:  GLUCOTROL XL   irbesartan-hydrochlorothiazide 300-12.5 MG tablet Commonly known as:  AVALIDE   meloxicam 15 MG tablet Commonly known as:  MOBIC  metFORMIN 500 MG 24 hr tablet Commonly known as:  GLUCOPHAGE-XR   metFORMIN 500 MG tablet Commonly known as:  GLUCOPHAGE   sitaGLIPtin-metformin 50-500 MG tablet Commonly known as:  JANUMET     TAKE these medications   allopurinol 100 MG tablet Commonly known as:  ZYLOPRIM Take 100 mg by mouth  daily.   cyanocobalamin 1000 MCG/ML injection Commonly known as:  (VITAMIN B-12) Inject into the muscle.   levothyroxine 50 MCG tablet Commonly known as:  SYNTHROID, LEVOTHROID Take 50 mcg by mouth daily.   metoprolol tartrate 25 MG tablet Commonly known as:  LOPRESSOR Take 25 mg by mouth 2 (two) times daily.   pantoprazole 40 MG tablet Commonly known as:  PROTONIX Take 1 tablet (40 mg total) by mouth 2 (two) times daily.   pioglitazone 15 MG tablet Commonly known as:  ACTOS Take 15 mg by mouth daily.   simvastatin 20 MG tablet Commonly known as:  ZOCOR Take 20 mg by mouth every evening.   TRULICITY 1.5 FA/2.1HY Sopn Generic drug:  Dulaglutide Inject 0.75 mg into the skin every 7 (seven) days.   Vitamin D (Ergocalciferol) 1.25 MG (50000 UT) Caps capsule Commonly known as:  DRISDOL Take 50,000 Units by mouth every 7 (seven) days.        DISCHARGE INSTRUCTIONS:  1.  Follow-up with PCP in 5 days 2.  Follow-up with GI in 4 weeks 3.  Take Protonix 40 mg twice daily 4.  Stop NSAIDs including meloxicam 5.  Hold the following medicines until restarted by PCP-glipizide, irbesartan-HCTZ, metformin, Janumet, aspirin DIET:  Cardiac diet DISCHARGE CONDITION:  Stable ACTIVITY:  Activity as tolerated OXYGEN:  Home Oxygen: No.  Oxygen Delivery: room air DISCHARGE LOCATION:  home   If you experience worsening of your admission symptoms, develop shortness of breath, life threatening emergency, suicidal or homicidal thoughts you must seek medical attention immediately by calling 911 or calling your MD immediately  if symptoms less severe.  You Must read complete instructions/literature along with all the possible adverse reactions/side effects for all the Medicines you take and that have been prescribed to you. Take any new Medicines after you have completely understood and accpet all the possible adverse reactions/side effects.   Please note  You were cared for by a  hospitalist during your hospital stay. If you have any questions about your discharge medications or the care you received while you were in the hospital after you are discharged, you can call the unit and asked to speak with the hospitalist on call if the hospitalist that took care of you is not available. Once you are discharged, your primary care physician will handle any further medical issues. Please note that NO REFILLS for any discharge medications will be authorized once you are discharged, as it is imperative that you return to your primary care physician (or establish a relationship with a primary care physician if you do not have one) for your aftercare needs so that they can reassess your need for medications and monitor your lab values.    On the day of Discharge:  VITAL SIGNS:  Blood pressure (!) 151/60, pulse 93, temperature 97.9 F (36.6 C), temperature source Oral, resp. rate 18, height 5\' 7"  (1.702 m), weight 69.2 kg, SpO2 97 %. PHYSICAL EXAMINATION:  GENERAL:  85 y.o.-year-old patient lying in the bed with no acute distress.  Well-appearing. EYES: Pupils equal, round, reactive to light and accommodation. No scleral icterus. Extraocular muscles intact.  HEENT: Head atraumatic, normocephalic.  Oropharynx and nasopharynx clear.  NECK:  Supple, no jugular venous distention. No thyroid enlargement, no tenderness.  LUNGS: Normal breath sounds bilaterally, no wheezing, rales,rhonchi or crepitation. No use of accessory muscles of respiration.  CARDIOVASCULAR: S1, S2 normal. No murmurs, rubs, or gallops.  ABDOMEN: Soft, non-tender, non-distended. Bowel sounds present. No organomegaly or mass.  EXTREMITIES: No pedal edema, cyanosis, or clubbing.  NEUROLOGIC: Cranial nerves II through XII are intact. Muscle strength 5/5 in all extremities. Sensation intact. Gait not checked.  PSYCHIATRIC: The patient is alert and oriented x 3.  SKIN: No obvious rash, lesion, or ulcer.  DATA REVIEW:    CBC Recent Labs  Lab 06/12/18 0501  WBC 9.9  HGB 8.6*  HCT 26.2*  PLT 322    Chemistries  Recent Labs  Lab 06/10/18 1606  06/12/18 0501  NA 137   < > 138  K 4.6   < > 3.9  CL 105   < > 109  CO2 23   < > 21*  GLUCOSE 238*   < > 140*  BUN 45*   < > 33*  CREATININE 2.14*   < > 1.79*  CALCIUM 9.0   < > 8.5*  AST 24  --   --   ALT 20  --   --   ALKPHOS 71  --   --   BILITOT 0.6  --   --    < > = values in this interval not displayed.     Microbiology Results  No results found for this or any previous visit.  RADIOLOGY:  No results found.   Management plans discussed with the patient, family and they are in agreement.  CODE STATUS: Prior   TOTAL TIME TAKING CARE OF THIS PATIENT: 40 minutes.    Berna Spare Michaelyn Wall M.D on 06/12/2018 at 1:00 PM  Between 7am to 6pm - Pager [phone]  After 6pm go to [email or web address] - Proofreader  [facility]  Office  [phone]  CC: Primary care physician; Maryland Pink, MD   Note: This dictation was prepared with Dragon dictation along with smaller phrase technology. Any transcriptional errors that result from this process are unintentional.

## 2018-06-12 NOTE — Discharge Instructions (Signed)
It was so nice to meet you during this hospitalization!  You came into the hospital because you were having black stools. You had an endoscopy that showed you had 2 ulcers inside your stomach that were likely bleeding. The bleeding stopped on its own.  I have prescribed a new medication called Protonix. Please take 40mg  twice a day to help your stomach lining continue to heal.  Please do not take any of the following medications in the future: meloxicam, ibuprofen, motrin, advil, BC/goody's powders, as these can all cause stomach ulcers.  Please do not take the following medications until they are restarted by your primary care doctor: aspirin, glipizide, irbesartan-HCTZ, metformin, or janumet.  Take care, Dr. Brett Albino

## 2018-06-12 NOTE — Progress Notes (Signed)
Erika Antigua, MD [address] [place], [place], [place], [address] [address], [place], [place], [place], [address] Phone: [phone]  Fax: [phone]   Subjective: No abdominal pain, no signs of active GI bleeding.  Patient hemodynamically stable.   Objective: Exam: Vital signs in last 24 hours: Vitals:   06/11/18 0259 06/11/18 0753 06/11/18 1540 06/11/18 2310  BP: (!) 130/54 (!) 145/48 (!) 143/58 (!) 141/54  Pulse: 68 70 65 66  Resp: 18   13  Temp: 98.3 F (36.8 C) 97.9 F (36.6 C) 97.8 F (36.6 C) 98.1 F (36.7 C)  TempSrc: Oral Oral Oral Oral  SpO2: 94% 98% 97% 97%  Weight:      Height:       Weight change:   Intake/Output Summary (Last 24 hours) at 06/12/2018 2297 Last data filed at 06/12/2018 9892 Gross per 24 hour  Intake 2607.93 ml  Output -  Net 2607.93 ml    General: No acute distress, AAO x3 Abd: Soft, NT/ND, No HSM Skin: Warm, no rashes Neck: Supple, Trachea midline   Lab Results: Lab Results  Component Value Date   WBC 9.9 06/12/2018   HGB 8.6 (L) 06/12/2018   HCT 26.2 (L) 06/12/2018   MCV 97.0 06/12/2018   PLT 322 06/12/2018   Micro Results: No results found for this or any previous visit (from the past 240 hour(s)). Studies/Results: Ct Abdomen Pelvis Wo Contrast  Result Date: 06/10/2018 CLINICAL DATA:  Pt went to PCP for multiple medical complaints including: Nausea, dark tarry stools, dizziness, abd distention, and increased weakness. No chest pains, abd pain, SOB, urinary symptoms, Vomiting or fever reported. No blood thinners other than a baby aspirin a day with hx of stomach ulcers. EXAM: CT ABDOMEN AND PELVIS WITHOUT CONTRAST TECHNIQUE: Multidetector CT imaging of the abdomen and pelvis was performed following the standard protocol without IV contrast. COMPARISON:  CT, 12/03/2014 FINDINGS: Lower chest: No acute findings.  Moderate hiatal hernia. Hepatobiliary: No focal liver abnormality is seen. No gallstones, gallbladder  wall thickening, or biliary dilatation. Pancreas: Unremarkable. No pancreatic ductal dilatation or surrounding inflammatory changes. Spleen: Normal in size without focal abnormality. Adrenals/Urinary Tract: No adrenal masses. Bilateral, left greater than right, renal cortical thinning. No convincing renal masses. No collecting system stones and no hydronephrosis. Ureters normal in course and in caliber. Bladder is unremarkable. Stomach/Bowel: Moderate hiatal hernia. Stomach otherwise unremarkable. Small bowel is normal in caliber. No wall thickening or inflammation. Colon is normal in caliber. There are numerous colonic diverticula. No diverticulitis. No colonic wall thickening or other inflammatory process. Normal appendix visualized. Vascular/Lymphatic: Aortic atherosclerosis. No enlarged abdominal or pelvic lymph nodes. Reproductive: Pessary supports the pelvic floor stable from the prior CT. Uterus is unremarkable. Homogeneous oval right adnexal cystic mass, average Hounsfield units of 2.6. This measures 5.3 x 3.8 x 4.6 cm, without significant change from the prior CT. Other: Minimal fat containing umbilical hernia. No other hernias. No ascites. Musculoskeletal: No fracture or acute finding. No osteoblastic or osteolytic lesions. Significant degenerative changes noted throughout the visualized spine. IMPRESSION: 1. No acute findings within the abdomen or pelvis. 2. There are numerous colonic diverticula, but no findings of diverticulitis or other bowel inflammatory process. 3. Benign right adnexal cyst that is stable from prior CTs. 4. Moderate hiatal hernia. This appears larger than on the most recent prior CT. 5. Aortic atherosclerosis. Electronically Signed   By: Lajean Manes M.D.   On: 06/10/2018 20:42   Medications:  Scheduled Meds: .  allopurinol  100 mg Oral Daily  . insulin aspart  0-20 Units Subcutaneous TID WC  . insulin aspart  0-5 Units Subcutaneous QHS  . levothyroxine  50 mcg Oral Daily  .  metoprolol tartrate  25 mg Oral BID  . [START ON 06/14/2018] pantoprazole  40 mg Intravenous Q12H  . simvastatin  20 mg Oral QPM   Continuous Infusions: . sodium chloride 50 mL/hr at 06/10/18 2326  . sodium chloride    . pantoprozole (PROTONIX) infusion 8 mg/hr (06/12/18 0537)   PRN Meds:.acetaminophen **OR** acetaminophen, albuterol, hydrALAZINE, ondansetron **OR** ondansetron (ZOFRAN) IV   Assessment: Active Problems:   GIB (gastrointestinal bleeding)    Plan: Hemoglobin stable at 8.6 We will proceed with EGD as planned today for evaluation of etiology of anemia Possible peptic ulcer disease from NSAID use is highest on the differential Other etiologies include AVM, esophagitis, dual for lesion  Continue PPI IV twice daily Continue n.p.o. Please follow-up with procedure for findings and recommendations postprocedure  I have discussed alternative options, risks & benefits,  which include, but are not limited to, bleeding, infection, perforation,respiratory complication & drug reaction.  The patient agrees with this plan & written consent will be obtained.      LOS: 2 days   Erika Antigua, MD 06/12/2018, 9:07 AM

## 2018-06-12 NOTE — Anesthesia Postprocedure Evaluation (Signed)
Anesthesia Post Note  Patient: Taji L Kneale  Procedure(s) Performed: ESOPHAGOGASTRODUODENOSCOPY (EGD) (Left )  Patient location during evaluation: Endoscopy Anesthesia Type: General Level of consciousness: awake and alert Pain management: pain level controlled Vital Signs Assessment: post-procedure vital signs reviewed and stable Respiratory status: spontaneous breathing, nonlabored ventilation, respiratory function stable and patient connected to nasal cannula oxygen Cardiovascular status: blood pressure returned to baseline and stable Postop Assessment: no apparent nausea or vomiting Anesthetic complications: no     Last Vitals:  Vitals:   06/12/18 1000 06/12/18 1029  BP: (!) 147/65 (!) 151/60  Pulse: 95 93  Resp: 18 18  Temp:  36.6 C  SpO2: 99% 97%    Last Pain:  Vitals:   06/12/18 1029  TempSrc: Oral  PainSc:                  Martha Clan

## 2018-06-12 NOTE — Op Note (Addendum)
[facility] Gastroenterology Patient Name: Erika Ochoa Procedure Date: 06/12/2018 9:21 AM MRN: [id number] Account #: [id number] Date of Birth: June 01, 1925 Admit Type: Inpatient Age: 85 Room: [facility], In ENDO ROOM 4 Gender: Female Note Status: Finalized Procedure:            Upper GI endoscopy Indications:          Acute post hemorrhagic anemia Providers:            Shristi Scheib B. Bonna Gains MD, MD Referring MD:         Irven Easterly. Kary Kos, MD (Referring MD) Medicines:            Monitored Anesthesia Care Complications:        No immediate complications. Procedure:            Pre-Anesthesia Assessment:                       - The risks and benefits of the procedure and the                        sedation options and risks were discussed with the                        patient. All questions were answered and informed                        consent was obtained.                       - Patient identification and proposed procedure were                        verified prior to the procedure.                       - ASA Grade Assessment: III - A patient with severe                        systemic disease.                       After obtaining informed consent, the endoscope was                        passed under direct vision. Throughout the procedure,                        the patient's blood pressure, pulse, and oxygen                        saturations were monitored continuously. The Endoscope                        was introduced through the mouth, and advanced to the                        second part of duodenum. The upper GI endoscopy was                        accomplished with ease. The patient tolerated the  procedure well. Findings:      The examined esophagus was normal.      Two non-bleeding cratered gastric ulcers with a clean ulcer base       (Forrest Class III) were found in the gastric fundus. The largest lesion       was 7 mm in largest  dimension. Biopsies were not done due to patient's       recent GI bleeding.      A small hiatal hernia was present.      The examined duodenum was normal. Impression:           - Normal esophagus.                       - Non-bleeding gastric ulcers with a clean ulcer base                        (Forrest Class III).                       - Small hiatal hernia.                       - Normal examined duodenum.                       - No specimens collected.                       - No evidence of active or recent bleeding seen                        throughout the exam.                       - The ulcers are the source of patient's anemia. Recommendation:       - Use Protonix (pantoprazole) 40 mg PO BID.                       - Return to my office in 4 weeks.                       - Avoid NSAIDs except Aspirin if medically indicated                       - Continue present medications.                       - Continue Serial CBCs and transfuse PRN                       - The findings and recommendations were discussed with                        the patient.                       - Resume regular diet.                       - Perform an H. pylori serology today. Procedure Code(s):    --- Professional ---  82956, Esophagogastroduodenoscopy, flexible, transoral;                        diagnostic, including collection of specimen(s) by                        brushing or washing, when performed (separate procedure) Diagnosis Code(s):    --- Professional ---                       K25.9, Gastric ulcer, unspecified as acute or chronic,                        without hemorrhage or perforation                       K44.9, Diaphragmatic hernia without obstruction or                        gangrene                       D62, Acute posthemorrhagic anemia CPT copyright 2018 American Medical Association. All rights reserved. The codes documented in this report are preliminary  and upon coder review may  be revised to meet current compliance requirements.  Vonda Antigua, MD Margretta Sidle B. Bonna Gains MD, MD 06/12/2018 9:43:26 AM This report has been signed electronically. Number of Addenda: 0 Note Initiated On: 06/12/2018 9:21 AM      [facility]

## 2018-06-12 NOTE — Anesthesia Preprocedure Evaluation (Signed)
Anesthesia Evaluation  Patient identified by MRN, date of birth, ID band Patient awake    Reviewed: Allergy & Precautions, H&P , NPO status , Patient's Chart, lab work & pertinent test results, reviewed documented beta blocker date and time   History of Anesthesia Complications Negative for: history of anesthetic complications  Airway Mallampati: III  TM Distance: >3 FB Neck ROM: full    Dental  (+) Partial Upper, Partial Lower, Dental Advidsory Given, Missing   Pulmonary shortness of breath (last week), neg sleep apnea, neg COPD, neg recent URI,           Cardiovascular Exercise Tolerance: Good hypertension, (-) angina+ CAD  (-) Past MI, (-) Cardiac Stents and (-) CABG (-) dysrhythmias (-) Valvular Problems/Murmurs     Neuro/Psych neg Seizures TIAnegative psych ROS   GI/Hepatic negative GI ROS, Neg liver ROS,   Endo/Other  diabetesHypothyroidism   Renal/GU CRFRenal disease  negative genitourinary   Musculoskeletal   Abdominal   Peds  Hematology negative hematology ROS (+)   Anesthesia Other Findings Past Medical History: No date: Abnormal mammogram, unspecified No date: Breast screening, unspecified No date: Diabetes ([facility]) No date: Hypertension No date: Mammographic microcalcification No date: Vaginal pessary present   Reproductive/Obstetrics negative OB ROS                             Anesthesia Physical Anesthesia Plan  ASA: III  Anesthesia Plan: General   Post-op Pain Management:    Induction: Intravenous  PONV Risk Score and Plan: 3 and Propofol infusion and TIVA  Airway Management Planned: Nasal Cannula and Natural Airway  Additional Equipment:   Intra-op Plan:   Post-operative Plan:   Informed Consent: I have reviewed the patients History and Physical, chart, labs and discussed the procedure including the risks, benefits and alternatives for the proposed  anesthesia with the patient or authorized representative who has indicated his/her understanding and acceptance.   Dental Advisory Given  Plan Discussed with: Anesthesiologist, CRNA and Surgeon  Anesthesia Plan Comments:         Anesthesia Quick Evaluation

## 2018-06-13 ENCOUNTER — Encounter: Payer: Self-pay | Admitting: Gastroenterology

## 2018-06-13 LAB — TYPE AND SCREEN
ABO/RH(D): O POS
ANTIBODY SCREEN: NEGATIVE
UNIT DIVISION: 0
UNIT DIVISION: 0

## 2018-06-13 LAB — BPAM RBC
Blood Product Expiration Date: 202001062359
Blood Product Expiration Date: 202001062359
ISSUE DATE / TIME: 202001032324
Unit Type and Rh: 9500
Unit Type and Rh: 9500

## 2018-06-13 LAB — PREPARE RBC (CROSSMATCH)

## 2018-06-14 LAB — H PYLORI, IGM, IGG, IGA AB
H Pylori IgG: 0.51 Index Value (ref 0.00–0.79)
H. Pylogi, Iga Abs: <9 units (ref 0.0–8.9)
H. Pylogi, Igm Abs: <9 units (ref 0.0–8.9)

## 2018-07-02 ENCOUNTER — Other Ambulatory Visit: Payer: Self-pay | Admitting: Internal Medicine

## 2018-07-11 ENCOUNTER — Ambulatory Visit: Payer: Medicare HMO | Admitting: Gastroenterology

## 2018-07-11 VITALS — BP 152/70 | HR 69 | Ht 67.0 in | Wt 151.8 lb

## 2018-07-11 DIAGNOSIS — D649 Anemia, unspecified: Secondary | ICD-10-CM | POA: Diagnosis not present

## 2018-07-11 DIAGNOSIS — K259 Gastric ulcer, unspecified as acute or chronic, without hemorrhage or perforation: Secondary | ICD-10-CM

## 2018-07-11 MED ORDER — PANTOPRAZOLE SODIUM 40 MG PO TBEC: 40.0000 mg | DELAYED_RELEASE_TABLET | Freq: Two times a day (BID) | ORAL | 1 refills | Status: DC

## 2018-07-11 NOTE — Progress Notes (Signed)
Vonda Antigua, MD [address]  [place]  [place], [place] [address]  Main: [phone]  Fax: [phone]   Primary Care Physician: [place] Pink, MD  CC: Hospital follow-up  HPI: Erika Ochoa is a 85 y.o. female here for follow-up of anemia and gastric ulcers.  Patient recently seen in the hospital in January 2020 for melena in the setting of meloxicam use and was found to have 2 nonbleeding gastric fundus ulcers.  Patient has been taking Protonix twice daily with no further signs of active GI bleeding. The patient denies abdominal or flank pain, anorexia, nausea or vomiting, dysphagia, change in bowel habits or black or bloody stools or weight loss.  Patient had a colonoscopy in 2003 for screening and one rectal polyp was removed.  Diverticulosis was noted.  Pathology report from this is not available.  Patient was 77 at the time.  No family history of colon cancer.  Current Outpatient Medications  Medication Sig Dispense Refill  . allopurinol (ZYLOPRIM) 100 MG tablet Take 100 mg by mouth daily.     . cyanocobalamin (,VITAMIN B-12,) 1000 MCG/ML injection Inject into the muscle.    . Dulaglutide (TRULICITY) 1.5 CX/4.4YJ SOPN Inject 0.75 mg into the skin every 7 (seven) days.     Marland Kitchen levothyroxine (SYNTHROID, LEVOTHROID) 50 MCG tablet Take 50 mcg by mouth daily.     . metoprolol tartrate (LOPRESSOR) 25 MG tablet Take 25 mg by mouth 2 (two) times daily.    . pantoprazole (PROTONIX) 40 MG tablet Take 1 tablet (40 mg total) by mouth 2 (two) times daily. 60 tablet 0  . pioglitazone (ACTOS) 15 MG tablet Take 15 mg by mouth daily.  6  . simvastatin (ZOCOR) 20 MG tablet Take 20 mg by mouth every evening.     . Vitamin D, Ergocalciferol, (DRISDOL) 50000 UNITS CAPS capsule Take 50,000 Units by mouth every 7 (seven) days.   99   No current facility-administered medications for this visit.     Allergies as of 07/11/2018 - Review Complete 06/12/2018  Allergen Reaction Noted  .  Oxycodone-aspirin Other (See Comments) 07/11/2014  . Augmentin [amoxicillin-pot clavulanate] Nausea Only 05/21/2012  . Codeine Nausea Only 05/21/2012  . Etodolac Other (See Comments) 07/11/2014  . Iodine Nausea Only 05/21/2012  . Levaquin [levofloxacin in d5w] Nausea Only 05/21/2012  . Levofloxacin Nausea Only 07/11/2014  . Metronidazole Nausea Only 12/19/2015  . Sulfa antibiotics Nausea Only 05/21/2012  . Ultram [tramadol] Nausea Only 05/21/2012    ROS:  General: Negative for anorexia, weight loss, fever, chills, fatigue, weakness. ENT: Negative for hoarseness, difficulty swallowing , nasal congestion. CV: Negative for chest pain, angina, palpitations, dyspnea on exertion, peripheral edema.  Respiratory: Negative for dyspnea at rest, dyspnea on exertion, cough, sputum, wheezing.  GI: See history of present illness. GU:  Negative for dysuria, hematuria, urinary incontinence, urinary frequency, nocturnal urination.  Endo: Negative for unusual weight change.    Physical Examination: Manual repeat BP documented below as 152/70. Pt asymptomatic (pt asked to f/u with PCP about her BP) Vitals:   07/11/18 1153 07/11/18 1225  BP: (!) 192/76 (!) 152/70  Pulse: 69   Weight: 151 lb 12.8 oz (68.9 kg)   Height: 5\' 7"  (1.702 m)      General: Well-nourished, well-developed in no acute distress.  Eyes: No icterus. Conjunctivae pink. Mouth: Oropharyngeal mucosa moist and pink , no lesions erythema or exudate. Neck: Supple, Trachea midline Abdomen: Bowel sounds are normal, nontender, nondistended, no hepatosplenomegaly  or masses, no abdominal bruits or hernia , no rebound or guarding.   Extremities: No lower extremity edema. No clubbing or deformities. Neuro: Alert and oriented x 3.  Grossly intact. Skin: Warm and dry, no jaundice.   Psych: Alert and cooperative, normal mood and affect.   Labs: CMP     Component Value Date/Time   NA 138 06/12/2018 0501   NA 136 05/05/2014 1032   K 3.9  06/12/2018 0501   K 3.9 05/05/2014 1032   CL 109 06/12/2018 0501   CL 103 05/05/2014 1032   CO2 21 (L) 06/12/2018 0501   CO2 27 05/05/2014 1032   GLUCOSE 140 (H) 06/12/2018 0501   GLUCOSE 226 (H) 05/05/2014 1032   BUN 33 (H) 06/12/2018 0501   BUN 24 (H) 05/05/2014 1032   CREATININE 1.79 (H) 06/12/2018 0501   CREATININE 1.47 (H) 05/05/2014 1032   CALCIUM 8.5 (L) 06/12/2018 0501   CALCIUM 9.1 05/05/2014 1032   PROT 6.7 06/10/2018 1606   PROT 7.0 05/05/2014 1032   ALBUMIN 3.6 06/10/2018 1606   ALBUMIN 3.4 05/05/2014 1032   AST 24 06/10/2018 1606   AST 33 05/05/2014 1032   ALT 20 06/10/2018 1606   ALT 43 05/05/2014 1032   ALKPHOS 71 06/10/2018 1606   ALKPHOS 77 05/05/2014 1032   BILITOT 0.6 06/10/2018 1606   BILITOT 0.9 05/05/2014 1032   GFRNONAA 24 (L) 06/12/2018 0501   GFRNONAA 36 (L) 05/05/2014 1032   GFRNONAA 34 (L) 02/11/2012 1403   GFRAA 28 (L) 06/12/2018 0501   GFRAA 43 (L) 05/05/2014 1032   GFRAA 39 (L) 02/11/2012 1403   Lab Results  Component Value Date   WBC 9.9 06/12/2018   HGB 8.6 (L) 06/12/2018   HCT 26.2 (L) 06/12/2018   MCV 97.0 06/12/2018   PLT 322 06/12/2018    Imaging Studies: No results found.  Assessment and Plan:   Erika Ochoa is a 85 y.o. y/o female here for follow-up of gastric ulcers and melena in the setting of meloxicam use  Continue PPI twice daily for a total of 3 months since June 11, 2018.  Patient can stop PPI on April 4 which would complete 3 months of therapy for gastric ulcers  Patient was educated on avoiding NSAIDs, except aspirin 81 mg if recommended by primary care provider H. pylori serology was negative in the hospital and etiology of ulcers was likely her meloxicam use, which patient has stopped.  Hemoglobin was 8.6 on hospital discharge, will repeat today to ensure it is improving  We discussed repeat EGD to obtain gastric biopsies since guidelines are in the setting of gastric ulcers to obtain biopsies.  Alternative of  conservative management with PPI only and no further endoscopic intervention was discussed as well.  Patient and family chose to continue with endoscopy.  We will schedule.  I have discussed alternative options, risks & benefits,  which include, but are not limited to, bleeding, infection, perforation,respiratory complication & drug reaction.  The patient agrees with this plan & written consent will be obtained.     Follow-up in 3 months  Dr Vonda Antigua

## 2018-07-12 LAB — HEMOGLOBIN: Hemoglobin: 9.4 g/dL — ABNORMAL LOW (ref 11.1–15.9)

## 2018-07-14 ENCOUNTER — Telehealth: Payer: Self-pay

## 2018-07-14 NOTE — Telephone Encounter (Signed)
LMTCO.

## 2018-07-14 NOTE — Telephone Encounter (Signed)
-----   Message from Virgel Manifold, MD sent at 07/12/2018  9:08 AM EST ----- Malin Cervini please let patient know, her Hemoglobin has improved to 9.4 from 8.6.  Continue to take the Protonix as prescribed, and we will repeat hemoglobin again in the future to ensure it is improving.

## 2018-07-15 NOTE — Telephone Encounter (Signed)
Son calls and given information to relay to pt.

## 2018-08-02 ENCOUNTER — Other Ambulatory Visit: Payer: Self-pay | Admitting: Gastroenterology

## 2018-08-23 ENCOUNTER — Encounter: Payer: Self-pay | Admitting: Obstetrics & Gynecology

## 2018-08-23 ENCOUNTER — Other Ambulatory Visit: Payer: Self-pay

## 2018-08-23 ENCOUNTER — Ambulatory Visit: Payer: Medicare HMO | Admitting: Obstetrics & Gynecology

## 2018-08-23 VITALS — BP 140/80 | Ht 65.0 in | Wt 152.0 lb

## 2018-08-23 DIAGNOSIS — N8111 Cystocele, midline: Secondary | ICD-10-CM | POA: Diagnosis not present

## 2018-08-23 DIAGNOSIS — N812 Incomplete uterovaginal prolapse: Secondary | ICD-10-CM | POA: Diagnosis not present

## 2018-08-23 NOTE — Progress Notes (Signed)
  HPI:      [name] is a 85 y.o. G2P0011 who presents today for her pessary follow up and examination related to her pelvic floor weakening.  Pt reports tolerating the pessary well with  no vaginal bleeding and  no vaginal discharge.  Symptoms of pelvic floor weakening have greatly improved. She is voiding and defecating without difficulty. She currently has a RING #4 pessary.  PMHx: She  has a past medical history of Abnormal mammogram, unspecified, Breast screening, unspecified, Diabetes ([place]), Hypertension, Mammographic microcalcification, and Vaginal pessary present. Also,  has a past surgical history that includes Skin lesion excision; Breast biopsy (Left, 2013); Breast excisional biopsy (Left); and Esophagogastroduodenoscopy (Left, 06/12/2018)., family history includes Cancer in her mother.,  reports that she has never smoked. She has never used smokeless tobacco. She reports that she does not drink alcohol or use drugs.  She has a current medication list which includes the following prescription(s): allopurinol, cyanocobalamin, dulaglutide, glipizide, glucose blood, glucose blood, levothyroxine, metoprolol tartrate, pantoprazole, simvastatin, and tradjenta. Also, is allergic to oxycodone-aspirin; augmentin [amoxicillin-pot clavulanate]; codeine; etodolac; iodine; levaquin [levofloxacin in d5w]; levofloxacin; metronidazole; sulfa antibiotics; and ultram [tramadol].  Review of Systems  All other systems reviewed and are negative.   Objective: BP 140/80   Ht 5\' 5"  (1.651 m)   Wt 152 lb (68.9 kg)   BMI 25.29 kg/m  Physical Exam Constitutional:      General: She is not in acute distress.    Appearance: She is well-developed.  Genitourinary:     Pelvic exam was performed with patient supine.     Vagina and uterus normal.     No vaginal erythema or bleeding.     No cervical motion tenderness, discharge, polyp or nabothian cyst.     Uterus is mobile.     Uterus is not enlarged.    No uterine mass detected.    Uterus is midaxial.     No right or left adnexal mass present.     Right adnexa not tender.     Left adnexa not tender.     Genitourinary Comments: Uterine prolapse and cystocele noted  HENT:     Head: Normocephalic and atraumatic.     Nose: Nose normal.  Abdominal:     General: There is no distension.     Palpations: Abdomen is soft.     Tenderness: There is no abdominal tenderness.  Musculoskeletal: Normal range of motion.  Neurological:     Mental Status: She is alert and oriented to person, place, and time.     Cranial Nerves: No cranial nerve deficit.  Skin:    General: Skin is warm and dry.     Pessary Care Pessary removed and cleaned.  Vagina checked - without erosions - pessary replaced.  A/P:   1. Uterovaginal prolapse, incomplete 2. Cystocele, midline Pessary was cleaned and replaced today. Instructions given for care. Concerning symptoms to observe for are counseled to patient. Follow up scheduled for 3 months.  A total of 15 minutes were spent face-to-face with the patient during this encounter and over half of that time dealt with counseling and coordination of care.  Barnett Applebaum, MD, [facility], [facility] 08/23/2018  9:30 AM

## 2018-08-27 ENCOUNTER — Other Ambulatory Visit: Payer: Self-pay | Admitting: Gastroenterology

## 2018-08-30 ENCOUNTER — Telehealth: Payer: Self-pay

## 2018-08-30 ENCOUNTER — Other Ambulatory Visit: Payer: Self-pay

## 2018-08-30 DIAGNOSIS — D649 Anemia, unspecified: Secondary | ICD-10-CM

## 2018-08-30 DIAGNOSIS — K259 Gastric ulcer, unspecified as acute or chronic, without hemorrhage or perforation: Secondary | ICD-10-CM

## 2018-08-30 NOTE — Telephone Encounter (Signed)
Patient has been contacted to reschedule her EGD from 09/06/18 to 11/02/18 due to COVID 19.  She has alos been asked to have her hgb checked between this week and next week.  She marked her calendar for Monday to have this done.  Hgb has been ordered for [employer] [facility] [facility].  Referral note updated and sent to [facility].  Thanks [employer]

## 2018-09-05 ENCOUNTER — Telehealth: Payer: Self-pay | Admitting: Gastroenterology

## 2018-09-05 NOTE — Telephone Encounter (Signed)
Pt husband left vm regarding pt reschedule of procedure  She was told to still have her labs done he needs to verify this information please call

## 2018-09-06 NOTE — Telephone Encounter (Signed)
I spoke with pt and she states her son had called. I tried to explain what I was calling about but pt had some trouble comprehending what I was saying. I left detailed message that procedure (EGD) was rescheduled to 11/02/18, she wrote down the phone mumbler to contact [place] [facility] endo to call on 5/26 between 1-3 pm. Nothing to eat or drink after midnight. Also to go to [facility] on [address]. Between [address] and [employer]. To contact office if any further questions.

## 2018-09-07 ENCOUNTER — Encounter: Admission: RE | Payer: Self-pay | Source: Home / Self Care

## 2018-09-07 ENCOUNTER — Ambulatory Visit: Admission: RE | Admit: 2018-09-07 | Payer: Medicare HMO | Source: Home / Self Care | Admitting: Gastroenterology

## 2018-09-07 SURGERY — ESOPHAGOGASTRODUODENOSCOPY (EGD) WITH PROPOFOL
Anesthesia: General

## 2018-09-13 ENCOUNTER — Telehealth: Payer: Self-pay

## 2018-09-13 NOTE — Telephone Encounter (Signed)
Contacted patient to discuss rescheduling her EGD scheduled for May 27th due to COVID 19 and ask her to have her hgb checked.  Patient has declined to have her EGD rescheduled, explained to her that it was needed to make sure her ulcers had healed.  She said she is not having any problems and feels just fine.  When asked to have her hgb checked she said she will ask Dr.Hedrick's to check it when she goes in for her office visit in a few weeks.   Thanks [employer]

## 2018-09-28 ENCOUNTER — Other Ambulatory Visit: Payer: Self-pay

## 2018-09-28 ENCOUNTER — Telehealth: Payer: Self-pay | Admitting: Gastroenterology

## 2018-09-28 DIAGNOSIS — K259 Gastric ulcer, unspecified as acute or chronic, without hemorrhage or perforation: Secondary | ICD-10-CM

## 2018-09-28 NOTE — Telephone Encounter (Signed)
Returned patients son Ronnie's call.  Explained that we really need to recheck on the ulcers to make sure they have healed.  He said he understands, it just concerns him because every time she is sedated her memory seems to get little worse.  His mothers EGD has been rescheduled to July 17th.  The hgb that was reported was actually just done last week.  Edd Arbour said that it was unchanged from the last one that was 10.4 as well.  Ms. Lanuza has a follow up scheduled with you on June 3rd for follow up after EGD, would you like this rescheduled since her EGD has been rescheduled to July 17th?  Please advise.  Thanks [employer]

## 2018-09-28 NOTE — Telephone Encounter (Signed)
Pt husband is calling to let us know pt Hemoglobin was 10.4 last month he also states pt is scheduled for Upper GI 11-02-18 and wants to know if pt really needs it he states it seems like every time they put her under she has trouble remembering afterwards also he states pt has not had much problem with Ulcers  Please call pt husband Erika Ochoa cell [phone]

## 2018-09-29 ENCOUNTER — Telehealth: Payer: Self-pay | Admitting: Gastroenterology

## 2018-09-29 NOTE — Telephone Encounter (Signed)
June apt has been cancelled pt has Televisit with Dr. Bonna Gains on 10/13/18

## 2018-10-13 ENCOUNTER — Ambulatory Visit (INDEPENDENT_AMBULATORY_CARE_PROVIDER_SITE_OTHER): Payer: Medicare HMO | Admitting: Gastroenterology

## 2018-10-13 DIAGNOSIS — K259 Gastric ulcer, unspecified as acute or chronic, without hemorrhage or perforation: Secondary | ICD-10-CM

## 2018-10-13 NOTE — Progress Notes (Signed)
Erika Antigua, MD [address]  [place]  [place], [place] [address]  Main: [phone]  Fax: [phone]   Primary Care Physician: Maryland Pink, MD  Virtual Visit via Telephone Note  I connected with patient on 10/13/18 at 10:00 AM EDT by telephone and verified that I am speaking with the correct person using two identifiers.   I discussed the limitations, risks, security and privacy concerns of performing an evaluation and management service by telephone and the availability of in person appointments. I also discussed with the patient that there may be a patient responsible charge related to this service. The patient expressed understanding and agreed to proceed.  Location of Patient: Home Location of Provider: Home Persons involved: Patient, her husband Edd Arbour, and provider only   History of Present Illness: CC: Follow-up of gastric ulcers  HPI: Erika Ochoa is a 85 y.o. female with history of anemia and gastric ulcers in January 2020 when she was admitted for melena in the setting of daily meloxicam use at the time, and found to have 2 nonbleeding gastric fundus ulcers.  Patient has since stopped meloxicam completely.  Is on aspirin 81 mg daily was restarted by PCP recently.  The patient denies abdominal or flank pain, anorexia, nausea or vomiting, dysphagia, change in bowel habits or black or bloody stools or weight loss.  Patient had a colonoscopy in 2003 for screening and one rectal polyp was removed. Diverticulosis was noted. Pathology report from this is not available. Patient was 77 at the time.  No family history of colon cancer.  Current Outpatient Medications  Medication Sig Dispense Refill  . cyanocobalamin (,VITAMIN B-12,) 1000 MCG/ML injection Inject into the muscle.    Marland Kitchen glipiZIDE (GLUCOTROL XL) 5 MG 24 hr tablet     . glucose blood (ONE TOUCH ULTRA TEST) test strip     . glucose blood (PRECISION QID TEST) test strip Use 2 (two) times daily     . irbesartan-hydrochlorothiazide (AVALIDE) 300-12.5 MG tablet     . levothyroxine (SYNTHROID, LEVOTHROID) 50 MCG tablet Take 50 mcg by mouth daily.     . metoprolol tartrate (LOPRESSOR) 25 MG tablet Take 25 mg by mouth 2 (two) times daily.    . pantoprazole (PROTONIX) 40 MG tablet TAKE 1 TABLET BY MOUTH TWICE A DAY 60 tablet 2  . simvastatin (ZOCOR) 20 MG tablet Take 20 mg by mouth every evening.     . TRADJENTA 5 MG TABS tablet     . Vitamin D, Ergocalciferol, (DRISDOL) 1.25 MG (50000 UT) CAPS capsule TAKE ONE CAPSULE BY MOUTH ONE TIME PER WEEK    . Dulaglutide (TRULICITY) 1.5 WI/2.0BT SOPN Inject 0.75 mg into the skin every 7 (seven) days.      No current facility-administered medications for this visit.     Allergies as of 10/13/2018 - Review Complete 08/23/2018  Allergen Reaction Noted  . Oxycodone hcl Other (See Comments) 09/13/2017  . Oxycodone-aspirin Other (See Comments) 07/11/2014  . Augmentin [amoxicillin-pot clavulanate] Nausea Only 05/21/2012  . Codeine Nausea Only 05/21/2012  . Etodolac Other (See Comments) 07/11/2014  . Iodine Nausea Only 05/21/2012  . Levaquin [levofloxacin in d5w] Nausea Only 05/21/2012  . Levofloxacin Nausea Only 07/11/2014  . Metronidazole Nausea Only 12/19/2015  . Ultram [tramadol] Nausea Only 05/21/2012  . Sulfa antibiotics Nausea Only and Other (See Comments) 05/21/2012    Review of Systems:    All systems reviewed and negative except where noted in HPI.   Observations/Objective:  Labs: CMP     Component Value Date/Time   NA 138 06/12/2018 0501   NA 136 05/05/2014 1032   K 3.9 06/12/2018 0501   K 3.9 05/05/2014 1032   CL 109 06/12/2018 0501   CL 103 05/05/2014 1032   CO2 21 (L) 06/12/2018 0501   CO2 27 05/05/2014 1032   GLUCOSE 140 (H) 06/12/2018 0501   GLUCOSE 226 (H) 05/05/2014 1032   BUN 33 (H) 06/12/2018 0501   BUN 24 (H) 05/05/2014 1032   CREATININE 1.79 (H) 06/12/2018 0501   CREATININE 1.47 (H) 05/05/2014 1032   CALCIUM  8.5 (L) 06/12/2018 0501   CALCIUM 9.1 05/05/2014 1032   PROT 6.7 06/10/2018 1606   PROT 7.0 05/05/2014 1032   ALBUMIN 3.6 06/10/2018 1606   ALBUMIN 3.4 05/05/2014 1032   AST 24 06/10/2018 1606   AST 33 05/05/2014 1032   ALT 20 06/10/2018 1606   ALT 43 05/05/2014 1032   ALKPHOS 71 06/10/2018 1606   ALKPHOS 77 05/05/2014 1032   BILITOT 0.6 06/10/2018 1606   BILITOT 0.9 05/05/2014 1032   GFRNONAA 24 (L) 06/12/2018 0501   GFRNONAA 36 (L) 05/05/2014 1032   GFRNONAA 34 (L) 02/11/2012 1403   GFRAA 28 (L) 06/12/2018 0501   GFRAA 43 (L) 05/05/2014 1032   GFRAA 39 (L) 02/11/2012 1403   Lab Results  Component Value Date   WBC 9.9 06/12/2018   HGB 9.4 (L) 07/11/2018   HCT 26.2 (L) 06/12/2018   MCV 97.0 06/12/2018   PLT 322 06/12/2018   Most recently hemoglobin in April 2020, above 10, see care everywhere  Imaging Studies: No results found.  Assessment and Plan:   Erika Ochoa is a 85 y.o. y/o female with history of gastric fundus ulcers in the setting of daily meloxicam use in January 2020  Assessment and Plan: No further episodes of bleeding No abdominal pain or alarm symptoms  Patient is completing PPI therapy and states she has been 30 more days left.  No side effects at this time.  No further refills after completing above therapy.  I discussed with patient and her family over the phone extensively that guidelines are to repeat EGD for biopsies when biopsies were not done on initial EGD due to GI bleed.  This is to rule out underlying malignancy.  However, patient and family state that since she is asymptomatic and doing well they would not like to do any procedures at this time and understand the risks of not doing biopsies at this time.  However, given that the ulcers occurred in the setting of daily meloxicam use, these ulcers are likely benign and from the NSAID use itself.  Patient would advised to continue to avoid NSAIDs except aspirin 81 mg daily that her primary care  provider has restarted her on.  After she completes her PPI therapy, if she starts having abdominal pain, fatigue, shortness of breath, changes in color of stool, or any other reason for concern, we may need to consider EGD if patient is agreeable, or leave patient on lifelong PPI therapy given her aspirin 81 mg daily use.  At this time we will not continue lifelong PPI therapy given that she is on a low-dose aspirin only, with lower risk of GI ulcers than the meloxicam she was taking which was the culprit medication at the time.  Risks associated with side effects of long-term PPI use would outweigh any benefits, unless symptoms or ulcers recur, given that she is only on  low-dose aspirin at this time.  Hemoglobin is improving as expected and was above ten 1 month ago.  Patient and family asked to notify us if it does not continue to improve as expected and they verbalized understanding.  Patient and family not interested in screening colonoscopy at this time either.  Follow Up Instructions: Follow-up with PCP as scheduled Follow-up with [place] as needed   I discussed the assessment and treatment plan with the patient. The patient was provided an opportunity to ask questions and all were answered. The patient agreed with the plan and demonstrated an understanding of the instructions.   The patient was advised to call back or seek an in-person evaluation if the symptoms worsen or if the condition fails to improve as anticipated.  I provided 20 minutes of non-face-to-face time during this encounter.   Virgel Manifold, MD  Speech recognition software was used to dictate this note.

## 2018-11-02 ENCOUNTER — Ambulatory Visit: Admit: 2018-11-02 | Payer: Medicare HMO | Admitting: Gastroenterology

## 2018-11-02 SURGERY — ESOPHAGOGASTRODUODENOSCOPY (EGD) WITH PROPOFOL
Anesthesia: General

## 2018-11-09 ENCOUNTER — Ambulatory Visit: Payer: Self-pay | Admitting: Gastroenterology

## 2018-11-18 ENCOUNTER — Other Ambulatory Visit: Payer: Self-pay | Admitting: Gastroenterology

## 2018-11-23 ENCOUNTER — Ambulatory Visit: Payer: Medicare HMO | Admitting: Obstetrics & Gynecology

## 2018-11-23 ENCOUNTER — Other Ambulatory Visit: Payer: Self-pay

## 2018-11-23 ENCOUNTER — Encounter: Payer: Self-pay | Admitting: Obstetrics & Gynecology

## 2018-11-23 VITALS — BP 120/80 | Ht 62.0 in | Wt 158.0 lb

## 2018-11-23 DIAGNOSIS — N8111 Cystocele, midline: Secondary | ICD-10-CM | POA: Diagnosis not present

## 2018-11-23 DIAGNOSIS — N812 Incomplete uterovaginal prolapse: Secondary | ICD-10-CM | POA: Diagnosis not present

## 2018-11-23 NOTE — Progress Notes (Signed)
  HPI:      [name] is a 85 y.o. G2P0011 who presents today for her pessary follow up and examination related to her pelvic floor weakening.  Pt reports tolerating the pessary well with  no vaginal bleeding and  no vaginal discharge.  Symptoms of pelvic floor weakening have greatly improved. She is voiding and defecating without difficulty. She currently has a #4 Ring type pessary.  PMHx: She  has a past medical history of Abnormal mammogram, unspecified, Breast screening, unspecified, Diabetes ([place]), Hypertension, Mammographic microcalcification, and Vaginal pessary present. Also,  has a past surgical history that includes Skin lesion excision; Breast biopsy (Left, 2013); Breast excisional biopsy (Left); and Esophagogastroduodenoscopy (Left, 06/12/2018)., family history includes Cancer in her mother.,  reports that she has never smoked. She has never used smokeless tobacco. She reports that she does not drink alcohol or use drugs.  She has a current medication list which includes the following prescription(s): cyanocobalamin, dulaglutide, glipizide, glucose blood, glucose blood, irbesartan-hydrochlorothiazide, levothyroxine, metoprolol tartrate, pantoprazole, simvastatin, tradjenta, and vitamin d (ergocalciferol). Also, is allergic to oxycodone hcl; oxycodone-aspirin; augmentin [amoxicillin-pot clavulanate]; codeine; etodolac; iodine; levaquin [levofloxacin in d5w]; levofloxacin; metronidazole; ultram [tramadol]; and sulfa antibiotics.  Review of Systems  All other systems reviewed and are negative.   Objective: BP 120/80   Ht 5\' 2"  (1.575 m)   Wt 158 lb (71.7 kg)   BMI 28.90 kg/m  Physical Exam Constitutional:      General: She is not in acute distress.    Appearance: She is well-developed.  Genitourinary:     Pelvic exam was performed with patient supine.     Vagina and uterus normal.     No vaginal erythema or bleeding.     No cervical motion tenderness, discharge, polyp or  nabothian cyst.     Uterus is mobile.     Uterus is not enlarged.     No uterine mass detected.    Uterus is midaxial.     No right or left adnexal mass present.     Right adnexa not tender.     Left adnexa not tender.     Genitourinary Comments: Mod cystocele, uterine prolapse  HENT:     Head: Normocephalic and atraumatic.     Nose: Nose normal.  Abdominal:     General: There is no distension.     Palpations: Abdomen is soft.     Tenderness: There is no abdominal tenderness.  Musculoskeletal: Normal range of motion.  Neurological:     Mental Status: She is alert and oriented to person, place, and time.     Cranial Nerves: No cranial nerve deficit.  Skin:    General: Skin is warm and dry.    Pessary Care Pessary removed and cleaned.  Vagina checked - without erosions - pessary replaced.  A/P:1. Uterovaginal prolapse, incomplete 2. Cystocele, midline Pessary was cleaned and replaced today. Instructions given for care. Concerning symptoms to observe for are counseled to patient. Follow up scheduled for 3 months.  A total of 15 minutes were spent face-to-face with the patient during this encounter and over half of that time dealt with counseling and coordination of care.  Barnett Applebaum, MD, [facility], [facility] 11/23/2018  8:55 AM

## 2018-12-20 ENCOUNTER — Other Ambulatory Visit: Admission: RE | Admit: 2018-12-20 | Payer: Medicare HMO | Source: Ambulatory Visit

## 2018-12-23 ENCOUNTER — Encounter: Admission: RE | Payer: Self-pay | Source: Home / Self Care

## 2018-12-23 ENCOUNTER — Ambulatory Visit: Admission: RE | Admit: 2018-12-23 | Payer: Medicare HMO | Source: Home / Self Care | Admitting: Gastroenterology

## 2018-12-23 SURGERY — ESOPHAGOGASTRODUODENOSCOPY (EGD) WITH PROPOFOL
Anesthesia: General

## 2019-02-23 ENCOUNTER — Encounter: Payer: Self-pay | Admitting: Obstetrics & Gynecology

## 2019-02-23 ENCOUNTER — Other Ambulatory Visit: Payer: Self-pay

## 2019-02-23 ENCOUNTER — Ambulatory Visit (INDEPENDENT_AMBULATORY_CARE_PROVIDER_SITE_OTHER): Payer: Medicare HMO | Admitting: Obstetrics & Gynecology

## 2019-02-23 VITALS — BP 138/80 | Ht 65.0 in | Wt 157.0 lb

## 2019-02-23 DIAGNOSIS — N8111 Cystocele, midline: Secondary | ICD-10-CM

## 2019-02-23 DIAGNOSIS — N812 Incomplete uterovaginal prolapse: Secondary | ICD-10-CM

## 2019-02-23 NOTE — Progress Notes (Signed)
HPI:      [name] is a 85 y.o. G2P0011 who presents today for her pessary follow up and examination related to her pelvic floor weakening.  Pt reports tolerating the pessary well with  no vaginal bleeding and  no vaginal discharge.  Symptoms of pelvic floor weakening have greatly improved. She is voiding and defecating without difficulty. She currently has a Ring #4 pessary.  PMHx: She  has a past medical history of Abnormal mammogram, unspecified, Breast screening, unspecified, Diabetes ([place]), Hypertension, Mammographic microcalcification, and Vaginal pessary present. Also,  has a past surgical history that includes Skin lesion excision; Breast biopsy (Left, 2013); Breast excisional biopsy (Left); and Esophagogastroduodenoscopy (Left, 06/12/2018)., family history includes Cancer in her mother.,  reports that she has never smoked. She has never used smokeless tobacco. She reports that she does not drink alcohol or use drugs.  She has a current medication list which includes the following prescription(s): cyanocobalamin, dulaglutide, glipizide, glucose blood, glucose blood, irbesartan-hydrochlorothiazide, levothyroxine, metoprolol tartrate, pantoprazole, simvastatin, tradjenta, and vitamin d (ergocalciferol). Also, is allergic to oxycodone hcl; oxycodone-aspirin; augmentin [amoxicillin-pot clavulanate]; codeine; etodolac; iodine; levaquin [levofloxacin in d5w]; levofloxacin; metronidazole; ultram [tramadol]; and sulfa antibiotics.  Review of Systems  All other systems reviewed and are negative.   Objective: BP 138/80   Ht 5\' 5"  (1.651 m)   Wt 157 lb (71.2 kg)   BMI 26.13 kg/m  Physical Exam Constitutional:      General: She is not in acute distress.    Appearance: She is well-developed.  Genitourinary:     Pelvic exam was performed with patient supine.     Vagina and uterus normal.     No vaginal erythema or bleeding.     No cervical motion tenderness, discharge, polyp or nabothian  cyst.     Uterus is mobile.     Uterus is not enlarged.     No uterine mass detected.    Uterus is midaxial.     No right or left adnexal mass present.     Right adnexa not tender.     Left adnexa not tender.     Genitourinary Comments: Mod cystocele  HENT:     Head: Normocephalic and atraumatic.     Nose: Nose normal.  Abdominal:     General: There is no distension.     Palpations: Abdomen is soft.     Tenderness: There is no abdominal tenderness.  Musculoskeletal: Normal range of motion.  Neurological:     Mental Status: She is alert and oriented to person, place, and time.     Cranial Nerves: No cranial nerve deficit.  Skin:    General: Skin is warm and dry.  Psychiatric:        Attention and Perception: Attention normal.        Mood and Affect: Mood and affect normal.        Speech: Speech normal.        Behavior: Behavior normal.        Thought Content: Thought content normal.        Judgment: Judgment normal.     Pessary Care Pessary removed and cleaned.  Vagina checked - without erosions - pessary replaced.  A/P:   ICD-10-CM   1. Cystocele, midline  N81.11   2. Uterovaginal prolapse, incomplete  N81.2    Pessary was cleaned and replaced today. Instructions given for care. Concerning symptoms to observe for are counseled to patient. Follow up scheduled for 3 months.  A total of 15 minutes were spent face-to-face with the patient during this encounter and over half of that time dealt with counseling and coordination of care.  Barnett Applebaum, MD, [facility], [facility] 02/23/2019  9:24 AM

## 2019-05-23 ENCOUNTER — Encounter: Payer: Self-pay | Admitting: Obstetrics & Gynecology

## 2019-05-23 ENCOUNTER — Other Ambulatory Visit: Payer: Self-pay

## 2019-05-23 ENCOUNTER — Ambulatory Visit (INDEPENDENT_AMBULATORY_CARE_PROVIDER_SITE_OTHER): Payer: Medicare HMO | Admitting: Obstetrics & Gynecology

## 2019-05-23 VITALS — BP 130/80 | Ht 64.0 in | Wt 162.0 lb

## 2019-05-23 DIAGNOSIS — N8111 Cystocele, midline: Secondary | ICD-10-CM | POA: Diagnosis not present

## 2019-05-23 DIAGNOSIS — N812 Incomplete uterovaginal prolapse: Secondary | ICD-10-CM | POA: Diagnosis not present

## 2019-05-23 NOTE — Progress Notes (Signed)
HPI:      [name] is a 85 y.o. G2P0011 who presents today for her pessary follow up and examination related to her pelvic floor weakening.  Pt reports tolerating the pessary well with  no vaginal bleeding and  no vaginal discharge.  Symptoms of pelvic floor weakening have greatly improved. She is voiding and defecating without difficulty. She currently has a Ring #4 type pessary.  PMHx: She  has a past medical history of Abnormal mammogram, unspecified, Breast screening, unspecified, Diabetes ([place]), Hypertension, Mammographic microcalcification, and Vaginal pessary present. Also,  has a past surgical history that includes Skin lesion excision; Breast biopsy (Left, 2013); Breast excisional biopsy (Left); and Esophagogastroduodenoscopy (Left, 06/12/2018)., family history includes Cancer in her mother.,  reports that she has never smoked. She has never used smokeless tobacco. She reports that she does not drink alcohol or use drugs.  She has a current medication list which includes the following prescription(s): cyanocobalamin, dulaglutide, glipizide, glucose blood, glucose blood, irbesartan-hydrochlorothiazide, levothyroxine, metoprolol tartrate, pantoprazole, simvastatin, tradjenta, and vitamin d (ergocalciferol). Also, is allergic to oxycodone hcl; oxycodone-aspirin; augmentin [amoxicillin-pot clavulanate]; codeine; etodolac; iodine; levaquin [levofloxacin in d5w]; levofloxacin; metronidazole; ultram [tramadol]; and sulfa antibiotics.  Review of Systems  All other systems reviewed and are negative.   Objective: BP 130/80   Ht 5\' 4"  (1.626 m)   Wt 162 lb (73.5 kg)   BMI 27.81 kg/m  Physical Exam Constitutional:      General: She is not in acute distress.    Appearance: She is well-developed.  Genitourinary:     Pelvic exam was performed with patient supine.     Vagina and uterus normal.     No vaginal erythema or bleeding.     No cervical motion tenderness, discharge, polyp or  nabothian cyst.     Uterus is mobile.     Uterus is not enlarged.     No uterine mass detected.    Uterus is midaxial.     No right or left adnexal mass present.     Right adnexa not tender.     Left adnexa not tender.     Genitourinary Comments: Atrophy noted Cystocele gr2  HENT:     Head: Normocephalic and atraumatic.     Nose: Nose normal.  Abdominal:     General: There is no distension.     Palpations: Abdomen is soft.     Tenderness: There is no abdominal tenderness.  Musculoskeletal:        General: Normal range of motion.  Neurological:     Mental Status: She is alert and oriented to person, place, and time.     Cranial Nerves: No cranial nerve deficit.  Skin:    General: Skin is warm and dry.  Psychiatric:        Attention and Perception: Attention normal.        Mood and Affect: Mood and affect normal.        Speech: Speech normal.        Behavior: Behavior normal.        Thought Content: Thought content normal.        Judgment: Judgment normal.     Pessary Care Pessary removed and cleaned.  Vagina checked - without erosions - pessary replaced.  A/P:   ICD-10-CM   1. Uterovaginal prolapse, incomplete  N81.2   2. Cystocele, midline  N81.11   Pessary was cleaned and replaced today. Instructions given for care. Concerning symptoms to observe for  are counseled to patient. Follow up scheduled for 3 months.  A total of 15 minutes were spent face-to-face with the patient during this encounter and over half of that time dealt with counseling and coordination of care.  Barnett Applebaum, MD, [facility], [facility] 05/23/2019  9:19 AM

## 2019-06-14 DIAGNOSIS — C4492 Squamous cell carcinoma of skin, unspecified: Secondary | ICD-10-CM

## 2019-06-14 HISTORY — DX: Squamous cell carcinoma of skin, unspecified: C44.92

## 2019-08-21 ENCOUNTER — Other Ambulatory Visit: Payer: Self-pay

## 2019-08-21 ENCOUNTER — Encounter: Payer: Self-pay | Admitting: Obstetrics & Gynecology

## 2019-08-21 ENCOUNTER — Ambulatory Visit: Payer: Medicare HMO | Admitting: Obstetrics & Gynecology

## 2019-08-21 VITALS — BP 120/70 | Ht 66.0 in | Wt 165.0 lb

## 2019-08-21 DIAGNOSIS — N812 Incomplete uterovaginal prolapse: Secondary | ICD-10-CM | POA: Diagnosis not present

## 2019-08-21 DIAGNOSIS — N8111 Cystocele, midline: Secondary | ICD-10-CM | POA: Diagnosis not present

## 2019-08-21 NOTE — Progress Notes (Signed)
HPI:      [name] is a 86 y.o. G2P0011 who presents today for her pessary follow up and examination related to her pelvic floor weakening.  Pt reports tolerating the pessary well with no vaginal bleeding and no vaginal discharge.  Symptoms of pelvic floor weakening have greatly improved. She is voiding and defecating without difficulty. She currently has a Ring #4 pessary.  PMHx: She  has a past medical history of Abnormal mammogram, unspecified, Breast screening, unspecified, Diabetes ([place]), Hypertension, Mammographic microcalcification, and Vaginal pessary present. Also,  has a past surgical history that includes Skin lesion excision; Breast biopsy (Left, 2013); Breast excisional biopsy (Left); and Esophagogastroduodenoscopy (Left, 06/12/2018)., family history includes Cancer in her mother.,  reports that she has never smoked. She has never used smokeless tobacco. She reports that she does not drink alcohol or use drugs.  She has a current medication list which includes the following prescription(s): cyanocobalamin, dulaglutide, glipizide, glucose blood, glucose blood, irbesartan-hydrochlorothiazide, levothyroxine, metoprolol tartrate, pantoprazole, simvastatin, tradjenta, and vitamin d (ergocalciferol). Also, is allergic to oxycodone hcl; oxycodone-aspirin; augmentin [amoxicillin-pot clavulanate]; codeine; etodolac; iodine; levaquin [levofloxacin in d5w]; levofloxacin; metronidazole; ultram [tramadol]; and sulfa antibiotics.  Review of Systems  All other systems reviewed and are negative.   Objective: BP 120/70   Ht 5\' 6"  (1.676 m)   Wt 165 lb (74.8 kg)   BMI 26.63 kg/m  Physical Exam Constitutional:      General: She is not in acute distress.    Appearance: She is well-developed.  Genitourinary:     Pelvic exam was performed with patient supine.     Vagina and uterus normal.     No vaginal erythema or bleeding.     No cervical motion tenderness, discharge, polyp or nabothian  cyst.     Uterus is mobile.     Uterus is not enlarged.     No uterine mass detected.    Uterus is midaxial.     No right or left adnexal mass present.     Right adnexa not tender.     Left adnexa not tender.     Genitourinary Comments: Atrophy noted Cystocele gr2   HENT:     Head: Normocephalic and atraumatic.     Nose: Nose normal.  Abdominal:     General: There is no distension.     Palpations: Abdomen is soft.     Tenderness: There is no abdominal tenderness.  Musculoskeletal:        General: Normal range of motion.  Neurological:     Mental Status: She is alert and oriented to person, place, and time.     Cranial Nerves: No cranial nerve deficit.  Skin:    General: Skin is warm and dry.  Psychiatric:        Attention and Perception: Attention normal.        Mood and Affect: Mood and affect normal.        Speech: Speech normal.        Behavior: Behavior normal.        Thought Content: Thought content normal.        Judgment: Judgment normal.     Pessary Care Pessary removed and cleaned.  Vagina checked - without erosions - pessary replaced.  A/P:   ICD-10-CM   1. Uterovaginal prolapse, incomplete  N81.2   2. Cystocele, midline  N81.11    Pessary was cleaned and replaced today. Instructions given for care. Concerning symptoms to observe for are  counseled to patient. Follow up scheduled for 3 months.  A total of 20 minutes were spent face-to-face with the patient as well as preparation, review, communication, and documentation during this encounter.   Barnett Applebaum, MD, [facility], [facility] 08/21/2019  9:30 AM

## 2019-08-29 ENCOUNTER — Encounter: Payer: Self-pay | Admitting: Emergency Medicine

## 2019-08-29 ENCOUNTER — Emergency Department: Payer: Medicare HMO

## 2019-08-29 ENCOUNTER — Other Ambulatory Visit: Payer: Self-pay

## 2019-08-29 ENCOUNTER — Observation Stay
Admission: EM | Admit: 2019-08-29 | Discharge: 2019-08-31 | Disposition: A | Discharge status: Home / Self Care | Payer: Medicare HMO | Attending: Internal Medicine | Admitting: Internal Medicine

## 2019-08-29 DIAGNOSIS — R0602 Shortness of breath: Secondary | ICD-10-CM | POA: Insufficient documentation

## 2019-08-29 DIAGNOSIS — N179 Acute kidney failure, unspecified: Secondary | ICD-10-CM | POA: Diagnosis not present

## 2019-08-29 DIAGNOSIS — Z8711 Personal history of peptic ulcer disease: Secondary | ICD-10-CM | POA: Insufficient documentation

## 2019-08-29 DIAGNOSIS — E538 Deficiency of other specified B group vitamins: Secondary | ICD-10-CM | POA: Diagnosis not present

## 2019-08-29 DIAGNOSIS — Z7984 Long term (current) use of oral hypoglycemic drugs: Secondary | ICD-10-CM | POA: Insufficient documentation

## 2019-08-29 DIAGNOSIS — E1122 Type 2 diabetes mellitus with diabetic chronic kidney disease: Secondary | ICD-10-CM | POA: Insufficient documentation

## 2019-08-29 DIAGNOSIS — Z7982 Long term (current) use of aspirin: Secondary | ICD-10-CM | POA: Diagnosis not present

## 2019-08-29 DIAGNOSIS — Z79890 Hormone replacement therapy (postmenopausal): Secondary | ICD-10-CM | POA: Insufficient documentation

## 2019-08-29 DIAGNOSIS — R002 Palpitations: Secondary | ICD-10-CM

## 2019-08-29 DIAGNOSIS — I2510 Atherosclerotic heart disease of native coronary artery without angina pectoris: Secondary | ICD-10-CM | POA: Diagnosis not present

## 2019-08-29 DIAGNOSIS — E1142 Type 2 diabetes mellitus with diabetic polyneuropathy: Secondary | ICD-10-CM | POA: Insufficient documentation

## 2019-08-29 DIAGNOSIS — Z20822 Contact with and (suspected) exposure to covid-19: Secondary | ICD-10-CM | POA: Insufficient documentation

## 2019-08-29 DIAGNOSIS — I129 Hypertensive chronic kidney disease with stage 1 through stage 4 chronic kidney disease, or unspecified chronic kidney disease: Secondary | ICD-10-CM | POA: Diagnosis not present

## 2019-08-29 DIAGNOSIS — M109 Gout, unspecified: Secondary | ICD-10-CM | POA: Diagnosis not present

## 2019-08-29 DIAGNOSIS — E1169 Type 2 diabetes mellitus with other specified complication: Secondary | ICD-10-CM | POA: Diagnosis present

## 2019-08-29 DIAGNOSIS — G459 Transient cerebral ischemic attack, unspecified: Secondary | ICD-10-CM | POA: Diagnosis present

## 2019-08-29 DIAGNOSIS — K921 Melena: Secondary | ICD-10-CM | POA: Insufficient documentation

## 2019-08-29 DIAGNOSIS — K222 Esophageal obstruction: Secondary | ICD-10-CM | POA: Diagnosis not present

## 2019-08-29 DIAGNOSIS — I4891 Unspecified atrial fibrillation: Secondary | ICD-10-CM | POA: Diagnosis present

## 2019-08-29 DIAGNOSIS — D509 Iron deficiency anemia, unspecified: Secondary | ICD-10-CM | POA: Diagnosis present

## 2019-08-29 DIAGNOSIS — Z79899 Other long term (current) drug therapy: Secondary | ICD-10-CM | POA: Insufficient documentation

## 2019-08-29 DIAGNOSIS — N189 Chronic kidney disease, unspecified: Secondary | ICD-10-CM | POA: Diagnosis present

## 2019-08-29 DIAGNOSIS — E039 Hypothyroidism, unspecified: Secondary | ICD-10-CM | POA: Diagnosis present

## 2019-08-29 DIAGNOSIS — N1831 Chronic kidney disease, stage 3a: Secondary | ICD-10-CM | POA: Insufficient documentation

## 2019-08-29 DIAGNOSIS — K2960 Other gastritis without bleeding: Secondary | ICD-10-CM | POA: Insufficient documentation

## 2019-08-29 DIAGNOSIS — K449 Diaphragmatic hernia without obstruction or gangrene: Secondary | ICD-10-CM | POA: Diagnosis not present

## 2019-08-29 DIAGNOSIS — E785 Hyperlipidemia, unspecified: Secondary | ICD-10-CM | POA: Insufficient documentation

## 2019-08-29 DIAGNOSIS — D649 Anemia, unspecified: Secondary | ICD-10-CM | POA: Diagnosis present

## 2019-08-29 DIAGNOSIS — I480 Paroxysmal atrial fibrillation: Secondary | ICD-10-CM

## 2019-08-29 DIAGNOSIS — I471 Supraventricular tachycardia: Secondary | ICD-10-CM | POA: Diagnosis present

## 2019-08-29 DIAGNOSIS — R7989 Other specified abnormal findings of blood chemistry: Secondary | ICD-10-CM | POA: Insufficient documentation

## 2019-08-29 DIAGNOSIS — Z791 Long term (current) use of non-steroidal anti-inflammatories (NSAID): Secondary | ICD-10-CM | POA: Insufficient documentation

## 2019-08-29 DIAGNOSIS — K922 Gastrointestinal hemorrhage, unspecified: Secondary | ICD-10-CM

## 2019-08-29 DIAGNOSIS — I10 Essential (primary) hypertension: Secondary | ICD-10-CM | POA: Diagnosis present

## 2019-08-29 DIAGNOSIS — R778 Other specified abnormalities of plasma proteins: Secondary | ICD-10-CM | POA: Diagnosis present

## 2019-08-29 DIAGNOSIS — E860 Dehydration: Secondary | ICD-10-CM | POA: Diagnosis not present

## 2019-08-29 DIAGNOSIS — Z85828 Personal history of other malignant neoplasm of skin: Secondary | ICD-10-CM | POA: Insufficient documentation

## 2019-08-29 DIAGNOSIS — Z8673 Personal history of transient ischemic attack (TIA), and cerebral infarction without residual deficits: Secondary | ICD-10-CM | POA: Insufficient documentation

## 2019-08-29 LAB — CBC WITH DIFFERENTIAL/PLATELET
Abs Immature Granulocytes: 0.03 10*3/uL (ref 0.00–0.07)
Basophils Absolute: 0.1 10*3/uL (ref 0.0–0.1)
Basophils Relative: 1 %
Eosinophils Absolute: 0.1 10*3/uL (ref 0.0–0.5)
Eosinophils Relative: 2 %
HCT: 21.2 % — ABNORMAL LOW (ref 36.0–46.0)
Hemoglobin: 6.5 g/dL — ABNORMAL LOW (ref 12.0–15.0)
Immature Granulocytes: 0 %
Lymphocytes Relative: 20 %
Lymphs Abs: 1.8 10*3/uL (ref 0.7–4.0)
MCH: 25.7 pg — ABNORMAL LOW (ref 26.0–34.0)
MCHC: 30.7 g/dL (ref 30.0–36.0)
MCV: 83.8 fL (ref 80.0–100.0)
Monocytes Absolute: 0.9 10*3/uL (ref 0.1–1.0)
Monocytes Relative: 11 %
Neutro Abs: 5.7 10*3/uL (ref 1.7–7.7)
Neutrophils Relative %: 66 %
Platelets: 331 10*3/uL (ref 150–400)
RBC: 2.53 MIL/uL — ABNORMAL LOW (ref 3.87–5.11)
RDW: 17.4 % — ABNORMAL HIGH (ref 11.5–15.5)
WBC: 8.6 10*3/uL (ref 4.0–10.5)
nRBC: 0 % (ref 0.0–0.2)

## 2019-08-29 LAB — MAGNESIUM: Magnesium: 2 mg/dL (ref 1.7–2.4)

## 2019-08-29 LAB — URINALYSIS, COMPLETE (UACMP) WITH MICROSCOPIC
Bacteria, UA: NONE SEEN
Bilirubin Urine: NEGATIVE
Glucose, UA: NEGATIVE mg/dL
Hgb urine dipstick: NEGATIVE
Ketones, ur: NEGATIVE mg/dL
Nitrite: NEGATIVE
Protein, ur: NEGATIVE mg/dL
Specific Gravity, Urine: 1.006 (ref 1.005–1.030)
pH: 7 (ref 5.0–8.0)

## 2019-08-29 LAB — COMPREHENSIVE METABOLIC PANEL
ALT: 17 U/L (ref 0–44)
AST: 22 U/L (ref 15–41)
Albumin: 3.6 g/dL (ref 3.5–5.0)
Alkaline Phosphatase: 53 U/L (ref 38–126)
Anion gap: 8 (ref 5–15)
BUN: 34 mg/dL — ABNORMAL HIGH (ref 8–23)
CO2: 25 mmol/L (ref 22–32)
Calcium: 9.2 mg/dL (ref 8.9–10.3)
Chloride: 106 mmol/L (ref 98–111)
Creatinine, Ser: 1.71 mg/dL — ABNORMAL HIGH (ref 0.44–1.00)
GFR calc Af Amer: 29 mL/min — ABNORMAL LOW (ref >60)
GFR calc non Af Amer: 25 mL/min — ABNORMAL LOW (ref >60)
Glucose, Bld: 182 mg/dL — ABNORMAL HIGH (ref 70–99)
Potassium: 3.9 mmol/L (ref 3.5–5.1)
Sodium: 139 mmol/L (ref 135–145)
Total Bilirubin: 0.6 mg/dL (ref 0.3–1.2)
Total Protein: 6.6 g/dL (ref 6.5–8.1)

## 2019-08-29 LAB — TSH: TSH: 3.725 u[IU]/mL (ref 0.350–4.500)

## 2019-08-29 LAB — TROPONIN I (HIGH SENSITIVITY): Troponin I (High Sensitivity): 28 ng/L — ABNORMAL HIGH (ref <18)

## 2019-08-29 LAB — T4, FREE: Free T4: 1 ng/dL (ref 0.61–1.12)

## 2019-08-29 NOTE — ED Triage Notes (Signed)
Pt arrived from home via [facility]. Pt c/o chest pain this afteroon. Upon arrival EMS found pt in Afib RVR, pt had HR 120's to 180's. Pt converted in ambulance w/ HR in 90's. Pt had BP of 170/90 and CBG 162 for EMS.

## 2019-08-29 NOTE — ED Provider Notes (Signed)
[facility] Emergency Department Provider Note  ____________________________________________   First MD Initiated Contact with Patient 08/29/19 2157     (approximate)  I have reviewed the triage vital signs and the nursing notes.  History  Chief Complaint Chest Pain    HPI Erika Ochoa is a 86 y.o. female who presents to the ED for palpitations, SOB, fatigue. Patient states all symptoms seemed to start this afternoon. Felt like her heart was racing and she couldn't catch her breath. Denies any discrete chest pain. Was otherwise well prior to this afternoon. No recent cough, congestion, vomiting, diarrhea, fever, or sick contacts. EMS noted patient to be in AF with RVR into the 130s and converted spontaneously en route. Patient denies known hx of AF but on chart review it has been listed before. She takes metoprolol (which she states is for her blood pressure), not on any blood thinning medications. On arrival to the ED she states the palpitations and SOB have resolves. She feels a bit tired.    Past Medical Hx Past Medical History:  Diagnosis Date  . Abnormal mammogram, unspecified   . Actinic keratosis   . Breast screening, unspecified   . Diabetes ([place])   . Hypertension   . Mammographic microcalcification   . Squamous cell carcinoma of skin 06/14/2019   Left superior forehead. Well differentiated. Tx: EDC  . Vaginal pessary present     Problem List Patient Active Problem List   Diagnosis Date Noted  . GIB (gastrointestinal bleeding) 06/10/2018  . Cellulitis of leg, left 03/07/2018  . Bilateral edema of lower extremity 03/07/2018  . Bilateral lower extremity pain 03/07/2018  . Cystocele, midline 08/12/2016  . Uterovaginal prolapse, incomplete 08/12/2016  . History of transient ischemic attack (TIA) 02/05/2016  . TIA (transient ischemic attack) 12/19/2015  . CKD (chronic kidney disease), stage III 12/19/2015  . Primary osteoarthritis of both  first carpometacarpal joints 07/24/2015  . B12 deficiency 02/25/2015  . A-fib ([place]) 07/11/2014  . Cervical pain 07/11/2014  . CAD (coronary artery disease) 07/11/2014  . Benign essential HTN 07/11/2014  . HLD (hyperlipidemia) 07/11/2014  . Type 2 diabetes mellitus with diabetic polyneuropathy, without long-term current use of insulin ([place]) 07/11/2014  . Essential (primary) hypertension 07/11/2014  . Adult hypothyroidism 07/11/2014    Past Surgical Hx Past Surgical History:  Procedure Laterality Date  . BREAST BIOPSY Left 2013   stereo calcs  . BREAST EXCISIONAL BIOPSY Left    "years ago"-benign  . ESOPHAGOGASTRODUODENOSCOPY Left 06/12/2018   Procedure: ESOPHAGOGASTRODUODENOSCOPY (EGD);  Surgeon: Virgel Manifold, MD;  Location: [facility] ENDOSCOPY;  Service: Endoscopy;  Laterality: Left;  . SKIN LESION EXCISION      Medications Prior to Admission medications   Medication Sig Start Date End Date Taking? Authorizing Provider  cyanocobalamin (,VITAMIN B-12,) 1000 MCG/ML injection Inject into the muscle. 10/25/15   [provider]  Dulaglutide (TRULICITY) 1.5 ZO/1.0RU SOPN Inject 0.75 mg into the skin every 7 (seven) days.     [provider]  glipiZIDE (GLUCOTROL XL) 5 MG 24 hr tablet  07/01/18   [provider]  glucose blood (ONE TOUCH ULTRA TEST) test strip  08/26/17   [provider]  glucose blood (PRECISION QID TEST) test strip Use 2 (two) times daily 08/25/17   [provider]  irbesartan-hydrochlorothiazide (AVALIDE) 300-12.5 MG tablet  10/11/18   [provider]  levothyroxine (SYNTHROID, LEVOTHROID) 50 MCG tablet Take 50 mcg by mouth daily.     [provider]  metoprolol tartrate (LOPRESSOR) 25 MG tablet Take 25 mg by mouth 2 (two) times daily.    [provider]  pantoprazole (PROTONIX) 40 MG tablet TAKE 1 TABLET BY MOUTH TWICE A DAY 08/29/18   Vonda Antigua B, MD  simvastatin (ZOCOR) 20 MG tablet Take 20  mg by mouth every evening.     [provider]  TRADJENTA 5 MG TABS tablet  07/01/18   [provider]  Vitamin D, Ergocalciferol, (DRISDOL) 1.25 MG (50000 UT) CAPS capsule TAKE ONE CAPSULE BY MOUTH ONE TIME PER WEEK 08/04/18   [provider]    Allergies Oxycodone hcl, Oxycodone-aspirin, Augmentin [amoxicillin-pot clavulanate], Codeine, Etodolac, Iodine, Levaquin [levofloxacin in d5w], Levofloxacin, Metronidazole, Ultram [tramadol], and Sulfa antibiotics  Family Hx Family History  Problem Relation Age of Onset  . Cancer Mother   . Breast cancer Neg Hx     Social Hx Social History   Tobacco Use  . Smoking status: Never Smoker  . Smokeless tobacco: Never Used  Substance Use Topics  . Alcohol use: No    Alcohol/week: 0.0 standard drinks  . Drug use: No     Review of Systems  Constitutional: Negative for fever, chills. + fatigue Eyes: Negative for visual changes. ENT: Negative for sore throat. Cardiovascular: Negative for chest pain. + palpitations  Respiratory: + for shortness of breath. Gastrointestinal: Negative for nausea, vomiting.  Genitourinary: Negative for dysuria. Musculoskeletal: Negative for leg swelling. Skin: Negative for rash. Neurological: Negative for headaches.   Physical Exam  Vital Signs: ED Triage Vitals  Enc Vitals Group     BP 08/29/19 2146 (!) 160/62     Pulse Rate 08/29/19 2146 88     Resp 08/29/19 2146 17     Temp 08/29/19 2146 98.2 F (36.8 C)     Temp Source 08/29/19 2146 Oral     SpO2 08/29/19 2146 98 %     Weight 08/29/19 2147 165 lb (74.8 kg)     Height 08/29/19 2147 5\' 6"  (1.676 m)     Head Circumference --      Peak Flow --      Pain Score 08/29/19 2147 0     Pain Loc --      Pain Edu? --      Excl. in [place]? --     Constitutional: Alert and oriented. Well appearing.  Head: Normocephalic. Atraumatic. Eyes: Conjunctivae clear, sclera anicteric. Pupils equal and symmetric. Nose: No masses or lesions.  No congestion or rhinorrhea. Mouth/Throat: Wearing mask.  Neck: No stridor. Trachea midline.  Cardiovascular: Normal rate, regular rhythm. Extremities well perfused. Respiratory: Normal respiratory effort.  Lungs CTAB. Gastrointestinal: Soft. Non-distended. Non-tender.  Genitourinary: Deferred. Musculoskeletal: Trace pretibial lower extremity edema. No deformities. Neurologic:  Normal speech and language. No gross focal or lateralizing neurologic deficits are appreciated.  Skin: Skin is warm, dry and intact. No rash noted. Psychiatric: Mood and affect are appropriate for situation.  EKG  Personally reviewed and interpreted by myself.   EMS EKG reveals AF with RVR with HR 130s  21:42 Rate: 86 Rhythm: sinus Axis: normal Intervals: RBBB therefore widened QRS TWI III, aVF Sinus with RBBB No STEMI    Radiology  CXR  IMPRESSION:  1. Chronic cardiomegaly with increased pulmonary vascular congestion  compared to 2017. Consider mild or developing interstitial edema.  2. Increased and moderate size gastric hiatal hernia.     Procedures  Procedure(s) performed (including critical care):  Procedures   Initial Impression /  Assessment and Plan / MDM / ED Course  86 y.o. female who presents to the ED for palpitations, SOB, fatigue. Found to be in AF w/ RVR with EMS, spontaneously converted en route.   Suspect likely symptomatic paroxysmal AF. Will evaluate for underlying anemia, electrolyte abnormality, thyroid disease, infection, as possible precipitating etiology. Patient agreeable. Remains asymptomatic and in NSR thus far.  Patient care transferred to oncoming provider due to shift change, awaiting labs and final disposition.  _______________________________  As part of my medical decision making I have reviewed available labs, radiology tests, reviewed old records.  Final Clinical Impression(s) / ED Diagnosis  Final diagnoses:  Palpitations  SOB (shortness of  breath)  Paroxysmal A-fib ([place])       Note:  This document was prepared using Dragon voice recognition software and may include unintentional dictation errors.   Lilia Pro., MD 08/29/19 2352

## 2019-08-30 ENCOUNTER — Encounter: Payer: Self-pay | Admitting: Internal Medicine

## 2019-08-30 ENCOUNTER — Other Ambulatory Visit: Payer: Self-pay

## 2019-08-30 DIAGNOSIS — N1831 Chronic kidney disease, stage 3a: Secondary | ICD-10-CM

## 2019-08-30 DIAGNOSIS — E039 Hypothyroidism, unspecified: Secondary | ICD-10-CM | POA: Diagnosis not present

## 2019-08-30 DIAGNOSIS — I10 Essential (primary) hypertension: Secondary | ICD-10-CM

## 2019-08-30 DIAGNOSIS — D649 Anemia, unspecified: Secondary | ICD-10-CM | POA: Diagnosis not present

## 2019-08-30 DIAGNOSIS — M109 Gout, unspecified: Secondary | ICD-10-CM | POA: Diagnosis present

## 2019-08-30 DIAGNOSIS — R778 Other specified abnormalities of plasma proteins: Secondary | ICD-10-CM | POA: Diagnosis present

## 2019-08-30 DIAGNOSIS — K922 Gastrointestinal hemorrhage, unspecified: Secondary | ICD-10-CM

## 2019-08-30 LAB — CBC
HCT: 23.8 % — ABNORMAL LOW (ref 36.0–46.0)
HCT: 36.6 % (ref 36.0–46.0)
Hemoglobin: 7.5 g/dL — ABNORMAL LOW (ref 12.0–15.0)
Hemoglobin: 11.7 g/dL — ABNORMAL LOW (ref 12.0–15.0)
MCH: 26.2 pg (ref 26.0–34.0)
MCH: 26.7 pg (ref 26.0–34.0)
MCHC: 31.5 g/dL (ref 30.0–36.0)
MCHC: 32 g/dL (ref 30.0–36.0)
MCV: 83.2 fL (ref 80.0–100.0)
MCV: 83.4 fL (ref 80.0–100.0)
Platelets: 313 10*3/uL (ref 150–400)
Platelets: 343 10*3/uL (ref 150–400)
RBC: 2.86 MIL/uL — ABNORMAL LOW (ref 3.87–5.11)
RBC: 4.39 MIL/uL (ref 3.87–5.11)
RDW: 16.8 % — ABNORMAL HIGH (ref 11.5–15.5)
RDW: 16.3 % — ABNORMAL HIGH (ref 11.5–15.5)
WBC: 8.4 10*3/uL (ref 4.0–10.5)
WBC: 10.8 10*3/uL — ABNORMAL HIGH (ref 4.0–10.5)
nRBC: 0 % (ref 0.0–0.2)
nRBC: 0 % (ref 0.0–0.2)

## 2019-08-30 LAB — GLUCOSE, CAPILLARY
Glucose-Capillary: 135 mg/dL — ABNORMAL HIGH (ref 70–99)
Glucose-Capillary: 202 mg/dL — ABNORMAL HIGH (ref 70–99)
Glucose-Capillary: 200 mg/dL — ABNORMAL HIGH (ref 70–99)

## 2019-08-30 LAB — SARS CORONAVIRUS 2 (TAT 6-24 HRS): SARS Coronavirus 2: NEGATIVE

## 2019-08-30 LAB — BRAIN NATRIURETIC PEPTIDE: B Natriuretic Peptide: 165 pg/mL — ABNORMAL HIGH (ref 0.0–100.0)

## 2019-08-30 LAB — PREPARE RBC (CROSSMATCH)

## 2019-08-30 LAB — APTT: aPTT: 42 seconds — ABNORMAL HIGH (ref 24–36)

## 2019-08-30 LAB — TROPONIN I (HIGH SENSITIVITY)
Troponin I (High Sensitivity): 44 ng/L — ABNORMAL HIGH (ref <18)
Troponin I (High Sensitivity): 76 ng/L — ABNORMAL HIGH (ref <18)
Troponin I (High Sensitivity): 71 ng/L — ABNORMAL HIGH (ref <18)
Troponin I (High Sensitivity): 64 ng/L — ABNORMAL HIGH (ref <18)

## 2019-08-30 LAB — PROTIME-INR
INR: 1 (ref 0.8–1.2)
Prothrombin Time: 13.1 seconds (ref 11.4–15.2)

## 2019-08-30 LAB — HEMOGLOBIN AND HEMATOCRIT, BLOOD
HCT: 30.5 % — ABNORMAL LOW (ref 36.0–46.0)
Hemoglobin: 9.3 g/dL — ABNORMAL LOW (ref 12.0–15.0)

## 2019-08-30 MED ORDER — INSULIN ASPART 100 UNIT/ML ~~LOC~~ SOLN: 0.0000 [IU] | Freq: Every day | SUBCUTANEOUS | Status: DC

## 2019-08-30 MED ORDER — ACETAMINOPHEN 325 MG PO TABS: 650.0000 mg | ORAL_TABLET | Freq: Four times a day (QID) | ORAL | Status: DC | PRN

## 2019-08-30 MED ORDER — SIMVASTATIN 20 MG PO TABS: 20.0000 mg | ORAL_TABLET | Freq: Every evening | ORAL | Status: DC

## 2019-08-30 MED ORDER — HYDRALAZINE HCL 20 MG/ML IJ SOLN: 5.0000 mg | INTRAMUSCULAR | Status: DC | PRN

## 2019-08-30 MED ORDER — AMLODIPINE BESYLATE 5 MG PO TABS
5.0000 mg | ORAL_TABLET | Freq: Every day | ORAL | Status: DC
  Administered 2019-08-30 – 2019-08-31 (×2): 5 mg via ORAL
  Filled 2019-08-30 (×3): qty 1

## 2019-08-30 MED ORDER — LEVOTHYROXINE SODIUM 50 MCG PO TABS
50.0000 ug | ORAL_TABLET | Freq: Every day | ORAL | Status: DC
  Administered 2019-08-30 – 2019-08-31 (×2): 50 ug via ORAL
  Filled 2019-08-30 (×2): qty 1

## 2019-08-30 MED ORDER — METOPROLOL TARTRATE 25 MG PO TABS
25.0000 mg | ORAL_TABLET | Freq: Two times a day (BID) | ORAL | Status: DC
  Administered 2019-08-30 – 2019-08-31 (×3): 25 mg via ORAL
  Filled 2019-08-30 (×3): qty 1

## 2019-08-30 MED ORDER — ALLOPURINOL 100 MG PO TABS
100.0000 mg | ORAL_TABLET | Freq: Every day | ORAL | Status: DC
  Administered 2019-08-31: 100 mg via ORAL
  Filled 2019-08-30 (×3): qty 1

## 2019-08-30 MED ORDER — SODIUM CHLORIDE 0.9 % IV SOLN
10.0000 mL/h | Freq: Once | INTRAVENOUS | Status: AC
  Administered 2019-08-30: 10 mL/h via INTRAVENOUS

## 2019-08-30 MED ORDER — INSULIN ASPART 100 UNIT/ML ~~LOC~~ SOLN: 0.0000 [IU] | Freq: Three times a day (TID) | SUBCUTANEOUS | Status: DC

## 2019-08-30 MED ORDER — ONDANSETRON HCL 4 MG/2ML IJ SOLN: 4.0000 mg | Freq: Three times a day (TID) | INTRAMUSCULAR | Status: DC | PRN

## 2019-08-30 MED ORDER — PANTOPRAZOLE SODIUM 40 MG IV SOLR
40.0000 mg | Freq: Two times a day (BID) | INTRAVENOUS | Status: DC
  Administered 2019-08-30 – 2019-08-31 (×3): 40 mg via INTRAVENOUS
  Filled 2019-08-30 (×4): qty 40

## 2019-08-30 NOTE — ED Notes (Signed)
Pt assisted to toilet by this RN, gait slow but steady. Pt placed top denture in patient labeled denture cup and cup placed on bedside table

## 2019-08-30 NOTE — ED Notes (Addendum)
2nd unit of blood initiated by this RN and Darl Householder, RN. This RN remains at bedside for 38min period as per protocol for patient safety. Pt visualized resting in bed with eyes closed while this RN at bedside. Lights dimmed for patient comfort at this time.   Admitting MD made aware via secure chat that patient Trop 16. Awaiting orders from admitting MD Dr. Blaine Hamper.

## 2019-08-30 NOTE — Consult Note (Addendum)
GI Inpatient Consult Note  Reason for Consult: Symptomatic anemia, guaiac positive stool   Attending Requesting Consult: Dr. Ivor Costa, MD  History of Present Illness: Erika Ochoa is a 86 y.o. female seen for evaluation of symptomatic anemia and heme positive stool at the request of Dr. Blaine Hamper. Pt has a PMH of HTN, HLD, DM, Hx of TIA, hypothyroidism, CAD, paroxsymal atrial fibrillation not on anticoagulation who presented to the ED last night for chief complaint of generalized weakness, fatigue, palpitations, and shortness of breath. She reports symptoms started yesterday afternoon. Upon presentation to the ED, she was found to have symptomatic anemia with hemoglobin 6.5. Previous hemoglobin 9.4 in early February. She was also found to have guaiac positive stool by ED physician. She was hospitalized in January 2020 for posthemorrhagic anemia and had EGD performed by Dr. Bonna Gains 06/12/2018 which showed 2 non-bleeding gastric ulcers in fundus with no biopsies taken presumably from daily meloxicam and aspirin use. She stopped meloxicam but has been taking a baby aspirin daily. Pt reports some chest tightness and weakness, but reports her palpitations are resolved at present. She denies any nausea, vomiting, dysphagia, abdominal pain, or overt blood loss in form of hematochezia or melena. She has received 2 units of pRBCs overnight and was started on IV Protonix. She has been taking Protonix 40 mg twice daily at home prior to admission. GI consulted in context of precipitous drop in hemoglobin with guaiac positive stool. No family at bedside, but patient able to answer most of my questions. She reports she lives by herself, but does have her son right down the road from her. She reports over the past few days she has not noticed a difference in her bowel habits. She reports she has not noticed any frank blood or tarry stools. She denies any abdominal pain.    Last Colonoscopy: 2003 - one rectal polyp removed,  path not available Last Endoscopy: 06/12/2018 - Normal esophagus. - Non-bleeding gastric ulcers with a clean ulcer base ([place] Class III). - Small hiatal hernia. - Normal examined duodenum. - No specimens collected.    Past Medical History:  Past Medical History:  Diagnosis Date  . Abnormal mammogram, unspecified   . Actinic keratosis   . Breast screening, unspecified   . Diabetes ([place])   . Hypertension   . Mammographic microcalcification   . Squamous cell carcinoma of skin 06/14/2019   Left superior forehead. Well differentiated. Tx: EDC  . Vaginal pessary present     Problem List: Patient Active Problem List   Diagnosis Date Noted  . Symptomatic anemia 08/30/2019  . Gout 08/30/2019  . Elevated troponin 08/30/2019  . GIB (gastrointestinal bleeding) 06/10/2018  . Cellulitis of leg, left 03/07/2018  . Bilateral edema of lower extremity 03/07/2018  . Bilateral lower extremity pain 03/07/2018  . Cystocele, midline 08/12/2016  . Uterovaginal prolapse, incomplete 08/12/2016  . History of transient ischemic attack (TIA) 02/05/2016  . TIA (transient ischemic attack) 12/19/2015  . Acute renal failure superimposed on stage 3a chronic kidney disease ([place]) 12/19/2015  . Primary osteoarthritis of both first carpometacarpal joints 07/24/2015  . B12 deficiency 02/25/2015  . A-fib ([place]) 07/11/2014  . Cervical pain 07/11/2014  . CAD (coronary artery disease) 07/11/2014  . Benign essential HTN 07/11/2014  . HLD (hyperlipidemia) 07/11/2014  . Type 2 diabetes mellitus with diabetic polyneuropathy, without long-term current use of insulin ([place]) 07/11/2014  . Essential (primary) hypertension 07/11/2014  . Adult hypothyroidism 07/11/2014    Past Surgical History: Past  Surgical History:  Procedure Laterality Date  . BREAST BIOPSY Left 2013   stereo calcs  . BREAST EXCISIONAL BIOPSY Left    "years ago"-benign  . ESOPHAGOGASTRODUODENOSCOPY Left 06/12/2018   Procedure:  ESOPHAGOGASTRODUODENOSCOPY (EGD);  Surgeon: Virgel Manifold, MD;  Location[facility] ENDOSCOPY;  Service: Endoscopy;  Laterality: Left;  . SKIN LESION EXCISION      Allergies: Allergies  Allergen Reactions  . Oxycodone Hcl Other (See Comments)    Other Reaction: GI Upset Other reaction(s): Other (See Comments) Other Reaction: GI Upset   . Oxycodone-Aspirin Other (See Comments)    Other Reaction: GI Upset (Per pt she does take aspirin 81 mg daily  . Augmentin [Amoxicillin-Pot Clavulanate] Nausea Only  . Codeine Nausea Only  . Etodolac Other (See Comments)  . Iodine Nausea Only  . Levaquin [Levofloxacin In D5w] Nausea Only  . Levofloxacin Nausea Only  . Metronidazole Nausea Only  . Ultram [Tramadol] Nausea Only  . Sulfa Antibiotics Nausea Only and Other (See Comments)    Other reaction(s): Unknown     Home Medications: (Not in a hospital admission)  Home medication reconciliation was completed with the patient.   Scheduled Inpatient Medications:   . allopurinol  100 mg Oral Daily  . amLODipine  5 mg Oral Daily  . insulin aspart  0-5 Units Subcutaneous QHS  . insulin aspart  0-9 Units Subcutaneous TID WC  . levothyroxine  50 mcg Oral Daily  . metoprolol tartrate  25 mg Oral BID  . pantoprazole (PROTONIX) IV  40 mg Intravenous Q12H  . simvastatin  20 mg Oral QPM    Continuous Inpatient Infusions:    PRN Inpatient Medications:  acetaminophen, hydrALAZINE, ondansetron (ZOFRAN) IV  Family History: family history includes Cancer in her mother.  The patient's family history is negative for inflammatory bowel disorders, GI malignancy, or solid organ transplantation.  Social History:   reports that she has never smoked. She has never used smokeless tobacco. She reports that she does not drink alcohol or use drugs. The patient denies ETOH, tobacco, or drug use.   Review of Systems: Constitutional: Weight is stable.  Eyes: No changes in vision. ENT: No oral lesions, sore  throat.  GI: see HPI.  Heme/Lymph: No easy bruising.  CV: No chest pain.  GU: No hematuria.  Integumentary: No rashes.  Neuro: No headaches.  Psych: No depression/anxiety.  Endocrine: No heat/cold intolerance.  Allergic/Immunologic: No urticaria.  Resp: No cough, SOB.  Musculoskeletal: No joint swelling.    Physical Examination: BP (!) 174/65 (BP Location: Left Arm)   Pulse 81   Temp 98.2 F (36.8 C) (Oral)   Resp 15   Ht 5\' 6"  (1.676 m)   Wt 74.8 kg   SpO2 96%   BMI 26.63 kg/m  Gen: NAD, alert and oriented x 4 HEENT: PEERLA, EOMI, Neck: supple, no JVD or thyromegaly Chest: CTA bilaterally, no wheezes, crackles, or other adventitious sounds CV: RRR, no m/g/c/r Abd: soft, NT, ND, +BS in all four quadrants; no HSM, guarding, ridigity, or rebound tenderness Ext: no edema, well perfused with 2+ pulses, Skin: no rash or lesions noted Lymph: no LAD  Data: Lab Results  Component Value Date   WBC 8.4 08/30/2019   HGB 7.5 (L) 08/30/2019   HCT 23.8 (L) 08/30/2019   MCV 83.2 08/30/2019   PLT 313 08/30/2019   Recent Labs  Lab 08/29/19 2248 08/30/19 0240 08/30/19 0817  HGB 6.5* 9.3* 7.5*   Lab Results  Component Value Date  NA 139 08/29/2019   K 3.9 08/29/2019   CL 106 08/29/2019   CO2 25 08/29/2019   BUN 34 (H) 08/29/2019   CREATININE 1.71 (H) 08/29/2019   Lab Results  Component Value Date   ALT 17 08/29/2019   AST 22 08/29/2019   ALKPHOS 53 08/29/2019   BILITOT 0.6 08/29/2019   Recent Labs  Lab 08/30/19 1014  APTT 42*  INR 1.0   Assessment/Plan:  86 y/o Caucasian female with a PMH of HTN, HLD, DM, Hx of TIA, hypothyroidism, CAD, paroxsymal atrial fibrillation not on anticoagulation presented to the ED for palpitations, shortness of breath, weakness, and chest tightness  1. Symptomatic anemia 2. Hx of gastric ulcers - seen on EGD 06/12/2018 3. Guaiac positive stools 4. Advanced age - PAF not on anticoagulation  -DDx of symptomatic anemia includes  occult GI blood loss from peptic ulcer disease, esophagitis, gastritis +/- H pylori, duodenitis, AVMs, malignancy, small bowel lesions, right colon source, nutritional deficiency -Agree with IV acid suppression -Continue to monitor H&H. Transfuse to ensure Hgb >7.0. -Hold ASA due to increased risk of bleeding -No overt signs of gastrointestinal blood loss. No active bleeding. -Advise EGD tomorrow with Dr. Alice Reichert for luminal evaluation.  I discussed procedure details and indications with patient.  I reviewed the risks (including bleeding, perforation, infection, anesthesia complications, cardiac/respiratory complications), benefits and alternatives of EGD. Patient consents to proceed. -I have attempted to call the patient's son Erika Ochoa) multiple times to go over plan of care. I left message on machine detailing the above plan of care.  -Clear liquids for dinner. NPO after midnight. -Covid test negative -See procedure note for findings and further recommendations    Thank you for the consult. Please call with questions or concerns.  [facility] [phone] (Cell)

## 2019-08-30 NOTE — ED Notes (Signed)
Moved pt to hospital bed for comfort.  Also brought pt push pop and Berrie juice and ordered her clear liquid tray.  Pt helped to the bathroom to void.  Pt is calm and denies any distress

## 2019-08-30 NOTE — ED Notes (Signed)
Report given to [facility], await transport to take pt to her room.  Pt updated

## 2019-08-30 NOTE — ED Notes (Signed)
Dietary called again for food tray.

## 2019-08-30 NOTE — ED Notes (Signed)
This RN to bedside, introduced self to patient. Pt alert and oriented at this time, pt denies any needs, VSS at this time. Call bell remains within reach of patient.

## 2019-08-30 NOTE — ED Provider Notes (Signed)
I assumed care of the patient from Dr. Joan Mayans at 11:00 PM.  Spoke with the patient who emphasized which she told Dr. Joan Mayans regarding her fatigue.  Laboratory data revealed a hemoglobin of 6.5.  Patient's guaiac positive.  Review of the patient's chart revealed that she did have an admission for gastrointestinal hemorrhage January 5 of 2020.  Patient received 2 units of crossmatch packed red blood cells in the emergency department.  I spoke with the patient and her husband at bedside at length regarding all clinical findings and blood transfusion.  Patient discussed with Dr. Damita Dunnings for hospital admission further evaluation and management.  CRITICAL CARE Performed by: Gregor Hams   Total critical care time: 30 minutes  Critical care time was exclusive of separately billable procedures and treating other patients.  Critical care was necessary to treat or prevent imminent or life-threatening deterioration.  Critical care was time spent personally by me on the following activities: development of treatment plan with patient and/or surrogate as well as nursing, discussions with consultants, evaluation of patient's response to treatment, examination of patient, obtaining history from patient or surrogate, ordering and performing treatments and interventions, ordering and review of laboratory studies, ordering and review of radiographic studies, pulse oximetry and re-evaluation of patient's condition.    Gregor Hams, MD 08/30/19 [phone]

## 2019-08-30 NOTE — H&P (Signed)
History and Physical    Erika Ochoa JKD:[id number] DOB: July 04, 1924 DOA: 08/29/2019  Referring MD/NP/PA:   PCP: [place] Pink, MD   Patient coming from:  The patient is coming from home.  At baseline, pt is independent for most of ADL.        Chief Complaint: Generalized weakness, fatigue, shortness of breath, heart racing  HPI: Erika Ochoa is a 86 y.o. female with medical history significant of hypertension, hyperlipidemia, diabetes mellitus, TIA, hypothyroidism GI bleeding, CAD, atrial fibrillation not on anticoagulants, who presents with generalized weakness, fatigue, shortness of breath and heart racing.  Patient states that her symptoms started yesterday afternoon, including generalized weakness, fatigue, shortness of breath and heart racing.  No unilateral numbness or tingling to extremities.  No facial droop or slurred speech.  Patient does not have chest pain, but reports chest heaviness.  She has chronic mild dry cough, which has not changed.  No fever or chills.  Patient denies rectal bleeding or dark stool.  No nausea vomiting, diarrhea or abdominal pain.  No symptoms of UTI.  Patient initially had atrial fibrillation with RVR, with heart rate of 120 to 180s, which has spontaneously converted to sinus rhythm with heart rate 90s.   ED Course: pt was found to have hemoglobin dropped from 9.4 on 07/11/2018 -->6.5, positive FOBT per EDP, troponin 28, 44, 76, BNP 165, pending COVID-19 PCR, worsening renal function, temperature normal, blood pressure 139/43, heart rate 75 currently, oxygen saturation 94% on room air.  Chest x-ray with cardiomegaly, vascular congestion and mild interstitial edema.  Patient is placed on telemetry bed for observation.  GI, Dr. Alice Reichert was consulted.  Review of Systems:   General: no fevers, chills, no body weight gain, has fatigue HEENT: no blurry vision, hearing changes or sore throat Respiratory: has dyspnea, coughing, no wheezing CV: no chest pain, has  palpitations and chest heaviness GI: no nausea, vomiting, abdominal pain, diarrhea, constipation GU: no dysuria, burning on urination, increased urinary frequency, hematuria  Ext: no leg edema Neuro: no unilateral weakness, numbness, or tingling, no vision change or hearing loss Skin: no rash, no skin tear. MSK: No muscle spasm, no deformity, no limitation of range of movement in spin Heme: No easy bruising.  Travel history: No recent long distant travel.  Allergy:  Allergies  Allergen Reactions  . Oxycodone Hcl Other (See Comments)    Other Reaction: GI Upset Other reaction(s): Other (See Comments) Other Reaction: GI Upset   . Oxycodone-Aspirin Other (See Comments)    Other Reaction: GI Upset (Per pt she does take aspirin 81 mg daily  . Augmentin [Amoxicillin-Pot Clavulanate] Nausea Only  . Codeine Nausea Only  . Etodolac Other (See Comments)  . Iodine Nausea Only  . Levaquin [Levofloxacin In D5w] Nausea Only  . Levofloxacin Nausea Only  . Metronidazole Nausea Only  . Ultram [Tramadol] Nausea Only  . Sulfa Antibiotics Nausea Only and Other (See Comments)    Other reaction(s): Unknown     Past Medical History:  Diagnosis Date  . Abnormal mammogram, unspecified   . Actinic keratosis   . Breast screening, unspecified   . Diabetes ([place])   . Hypertension   . Mammographic microcalcification   . Squamous cell carcinoma of skin 06/14/2019   Left superior forehead. Well differentiated. Tx: EDC  . Vaginal pessary present     Past Surgical History:  Procedure Laterality Date  . BREAST BIOPSY Left 2013   stereo calcs  . BREAST EXCISIONAL BIOPSY Left    "  years ago"-benign  . ESOPHAGOGASTRODUODENOSCOPY Left 06/12/2018   Procedure: ESOPHAGOGASTRODUODENOSCOPY (EGD);  Surgeon: Virgel Manifold, MD;  Location: [facility] ENDOSCOPY;  Service: Endoscopy;  Laterality: Left;  . SKIN LESION EXCISION      Social History:  reports that she has never smoked. She has never used smokeless  tobacco. She reports that she does not drink alcohol or use drugs.  Family History:  Family History  Problem Relation Age of Onset  . Cancer Mother   . Breast cancer Neg Hx      Prior to Admission medications   Medication Sig Start Date End Date Taking? Authorizing Provider  allopurinol (ZYLOPRIM) 100 MG tablet Take 100 mg by mouth daily. 08/23/19  Yes [provider]  aspirin EC 81 MG tablet Take 81 mg by mouth daily.   Yes [provider]  cyanocobalamin (,VITAMIN B-12,) 1000 MCG/ML injection Inject 1,000 mcg into the muscle every 30 (thirty) days.  10/25/15  Yes [provider]  glipiZIDE (GLUCOTROL XL) 5 MG 24 hr tablet Take 5 mg by mouth 2 (two) times daily.  07/01/18  Yes [provider]  irbesartan-hydrochlorothiazide (AVALIDE) 300-12.5 MG tablet Take 0.5 tablets by mouth daily.  10/11/18  Yes [provider]  levothyroxine (SYNTHROID, LEVOTHROID) 50 MCG tablet Take 50 mcg by mouth daily.    Yes [provider]  metoprolol tartrate (LOPRESSOR) 25 MG tablet Take 25 mg by mouth 2 (two) times daily.   Yes [provider]  pioglitazone (ACTOS) 30 MG tablet Take 30 mg by mouth daily. 07/12/19  Yes [provider]  simvastatin (ZOCOR) 20 MG tablet Take 20 mg by mouth every evening.    Yes [provider]  TRADJENTA 5 MG TABS tablet Take 5 mg by mouth daily.  07/01/18  Yes [provider]  Vitamin D, Ergocalciferol, (DRISDOL) 1.25 MG (50000 UT) CAPS capsule Take 50,000 Units by mouth every Saturday.  08/04/18  Yes [provider]  glucose blood (ONE TOUCH ULTRA TEST) test strip 1 each by Other route as directed.  08/26/17   [provider]  glucose blood (PRECISION QID TEST) test strip 1 each by Other route 2 (two) times daily.  08/25/17   [provider]    Physical Exam: Vitals:   08/30/19 1000 08/30/19 1017 08/30/19 1030 08/30/19 1300  BP: (!) 175/57 (!) 175/57 (!) 171/63 (!)  174/65  Pulse: 77  76 81  Resp: 17  17 15   Temp:    98.2 F (36.8 C)  TempSrc:    Oral  SpO2: 97%  95% 96%  Weight:      Height:       General: Not in acute distress. Pale looking. HEENT:       Eyes: PERRL, EOMI, no scleral icterus.       ENT: No discharge from the ears and nose, no pharynx injection, no tonsillar enlargement.        Neck: No JVD, no bruit, no mass felt. Heme: No neck lymph node enlargement. Cardiac: S1/S2, RRR, No murmurs, No gallops or rubs. Respiratory:  No rales, wheezing, rhonchi or rubs. GI: Soft, nondistended, nontender, no rebound pain, no organomegaly, BS present. GU: No hematuria Ext: No pitting leg edema bilaterally. 2+DP/PT pulse bilaterally. Musculoskeletal: No joint deformities, No joint redness or warmth, no limitation of ROM in spin. Skin: No rashes.  Neuro: Alert, oriented X3, cranial nerves II-XII grossly intact, moves all extremities normally. Psych: Patient is not psychotic, no suicidal or hemocidal ideation.  Labs on Admission: I have personally reviewed following labs and imaging studies  CBC: Recent Labs  Lab 08/29/19 2248 08/30/19 0240 08/30/19 0817  WBC 8.6  --  8.4  NEUTROABS 5.7  --   --   HGB 6.5* 9.3* 7.5*  HCT 21.2* 30.5* 23.8*  MCV 83.8  --  83.2  PLT 331  --  361   Basic Metabolic Panel: Recent Labs  Lab 08/29/19 2248  NA 139  K 3.9  CL 106  CO2 25  GLUCOSE 182*  BUN 34*  CREATININE 1.71*  CALCIUM 9.2  MG 2.0   GFR: Estimated Creatinine Clearance: 21.3 mL/min (A) (by C-G formula based on SCr of 1.71 mg/dL (H)). Liver Function Tests: Recent Labs  Lab 08/29/19 2248  AST 22  ALT 17  ALKPHOS 53  BILITOT 0.6  PROT 6.6  ALBUMIN 3.6   No results for input(s): LIPASE, AMYLASE in the last 168 hours. No results for input(s): AMMONIA in the last 168 hours. Coagulation Profile: Recent Labs  Lab 08/30/19 1014  INR 1.0   Cardiac Enzymes: No results for input(s): CKTOTAL, CKMB, CKMBINDEX, TROPONINI in the  last 168 hours. BNP (last 3 results) No results for input(s): PROBNP in the last 8760 hours. HbA1C: No results for input(s): HGBA1C in the last 72 hours. CBG: No results for input(s): GLUCAP in the last 168 hours. Lipid Profile: No results for input(s): CHOL, HDL, LDLCALC, TRIG, CHOLHDL, LDLDIRECT in the last 72 hours. Thyroid Function Tests: Recent Labs    08/29/19 2248  TSH 3.725  FREET4 1.00   Anemia Panel: No results for input(s): VITAMINB12, FOLATE, FERRITIN, TIBC, IRON, RETICCTPCT in the last 72 hours. Urine analysis:    Component Value Date/Time   COLORURINE STRAW (A) 08/29/2019 2248   APPEARANCEUR HAZY (A) 08/29/2019 2248   APPEARANCEUR Clear 05/05/2014 1202   LABSPEC 1.006 08/29/2019 2248   LABSPEC 1.008 05/05/2014 1202   PHURINE 7.0 08/29/2019 2248   GLUCOSEU NEGATIVE 08/29/2019 2248   GLUCOSEU Negative 05/05/2014 1202   HGBUR NEGATIVE 08/29/2019 2248   BILIRUBINUR NEGATIVE 08/29/2019 2248   BILIRUBINUR Negative 05/05/2014 1202   KETONESUR NEGATIVE 08/29/2019 2248   PROTEINUR NEGATIVE 08/29/2019 2248   NITRITE NEGATIVE 08/29/2019 2248   LEUKOCYTESUR SMALL (A) 08/29/2019 2248   LEUKOCYTESUR 1+ 05/05/2014 1202   Sepsis Labs: @LABRCNTIP (procalcitonin:4,lacticidven:4) )No results found for this or any previous visit (from the past 240 hour(s)).   Radiological Exams on Admission: DG Chest Port 1 View  Result Date: 08/29/2019 CLINICAL DATA:  86 year old female with chest pain. Found to be in atrial fibrillation with RVR. EXAM: PORTABLE CHEST 1 VIEW COMPARISON:  Portable chest 12/19/2015 and earlier. FINDINGS: Portable AP upright view at 2223 hours. Chronic cardiomegaly. A moderate size gastric hiatal hernia appears larger. Calcified aortic atherosclerosis. Other mediastinal contours are within normal limits. Visualized tracheal air column is within normal limits. Increased pulmonary vascular congestion. No pneumothorax, pleural effusion or consolidation. No acute  osseous abnormality identified. IMPRESSION: 1. Chronic cardiomegaly with increased pulmonary vascular congestion compared to 2017. Consider mild or developing interstitial edema. 2. Increased and moderate size gastric hiatal hernia. Electronically Signed   By: Genevie Ann M.D.   On: 08/29/2019 22:32     EKG: Independently reviewed.  Sinus rhythm, QTC 471, low voltage, right bundle blockage, early R wave progression   Assessment/Plan Principal Problem:   Symptomatic anemia Active Problems:   A-fib (HCC)   CAD (coronary artery disease)   HLD (hyperlipidemia)   Type 2 diabetes  mellitus with diabetic polyneuropathy, without long-term current use of insulin (H[facility])   Essential (primary) hypertension   Adult hypothyroidism   TIA (transient ischemic attack)   Acute renal failure superimposed on stage 3a chronic kidney disease ([facility])   GIB (gastrointestinal bleeding)   Gout   Elevated troponin   Symptomatic anemia likely due to GIB: FOBT is positive. Hgb dropped from 9. 4 to 6.5. Dr. Alice Reichert of GI is consulted.  - will place in med-surg bed obs - 2 units of blood was ordered by EDP  - GI consulted by Ed, will follow up recommendations - NPO  - Start IV pantoprazole protonix 40 mg bid by IV - Zofran IV for nausea - Avoid NSAIDs and SQ heparin - Maintain IV access (2 large bore IVs if possible). - Monitor closely and follow q6h cbc, transfuse as necessary, if Hgb<7.0 - LaB: INR, PTT and type screen  A-fib [facility]): pt had A fib with RVR initially, converted to sinus rhythm now. HR 70-80s. Not on anticoagulants at home -Continue metoprolol  Hx of CAD (coronary artery disease) and elevated trop: trop 28 -->44 -->76. Currently no CP. -hold ASA due to GIB -continue zocor and metoprolol -Trend troponin -Check A1c and FLP -Repeat EKG in morning  HLD (hyperlipidemia): -zocor  Type 2 diabetes mellitus with diabetic polyneuropathy, without long-term current use of insulin ([place]): Most recent A1c  7.4, poorly controled. Patient is taking Tradjenta, Actos and glipizide at home -SSI  Essential (primary) hypertension: -Continue home medications: Metoprolol -Hold irbesartan-HCTZ due to worsening renal function -hydralazine prn  Adult hypothyroidism: -Synthroid  TIA (transient ischemic attack) -Hold aspirin -Continue Zocor  Acute renal failure superimposed on stage 3a chronic kidney disease: Baseline Cre is 1.4, pt's Cre is 1.71 and BUN 34  on admission. Likely due to prerenal secondary to dehydration and continuation of irbesartan-HCTZ - Follow up renal function by BMP - Avoid using renal toxic medications, hypotension and contrast dye (or carefully use) - Hold irbesartan-HCTZ  Gout: -Allopurinol     DVT ppx: SCD Code Status: Partial code. Per his son, patient wants to be partial code, OK for CPR, but no intubation). Family Communication: Yes, patient's son by phone Disposition Plan:  Anticipate discharge back to previous home environment Consults called:  Dr. Alice Reichert of GI Admission status: Med-surg bed for obs    Date of Service 08/30/2019    [facility]   If 7PM-7AM, please contact night-coverage [email or web address] 08/30/2019, 1:31 PM\

## 2019-08-30 NOTE — ED Notes (Signed)
Type and screen drawn and sent to lab

## 2019-08-30 NOTE — ED Notes (Signed)
This RN to bedside, trop collected by this RN, pt continues to rest with NAD noted at this time. Pt tolerating blood transfusion well. Call bell remains within reach of patient at this time.

## 2019-08-30 NOTE — ED Notes (Signed)
Meds administered per MD order. Pt tolerated well. Lights dimmed for patient comfort. Pt denies further needs. Call bell remains with reach of patient at this time.

## 2019-08-30 NOTE — ED Notes (Signed)
This RN called dietary to request a meal tray for pt.

## 2019-08-31 ENCOUNTER — Observation Stay: Payer: Medicare HMO | Admitting: Anesthesiology

## 2019-08-31 ENCOUNTER — Encounter: Payer: Self-pay | Admitting: Internal Medicine

## 2019-08-31 ENCOUNTER — Encounter
Admission: EM | Disposition: A | Discharge status: Home / Self Care | Payer: Self-pay | Source: Home / Self Care | Attending: Student

## 2019-08-31 DIAGNOSIS — D649 Anemia, unspecified: Secondary | ICD-10-CM | POA: Diagnosis not present

## 2019-08-31 HISTORY — PX: ESOPHAGOGASTRODUODENOSCOPY: SHX5428

## 2019-08-31 LAB — BASIC METABOLIC PANEL
Anion gap: 8 (ref 5–15)
BUN: 23 mg/dL (ref 8–23)
CO2: 23 mmol/L (ref 22–32)
Calcium: 8.9 mg/dL (ref 8.9–10.3)
Chloride: 107 mmol/L (ref 98–111)
Creatinine, Ser: 1.53 mg/dL — ABNORMAL HIGH (ref 0.44–1.00)
GFR calc Af Amer: 34 mL/min — ABNORMAL LOW (ref >60)
GFR calc non Af Amer: 29 mL/min — ABNORMAL LOW (ref >60)
Glucose, Bld: 139 mg/dL — ABNORMAL HIGH (ref 70–99)
Potassium: 3.8 mmol/L (ref 3.5–5.1)
Sodium: 138 mmol/L (ref 135–145)

## 2019-08-31 LAB — CBC
HCT: 31.1 % — ABNORMAL LOW (ref 36.0–46.0)
HCT: 32.2 % — ABNORMAL LOW (ref 36.0–46.0)
HCT: 33.6 % — ABNORMAL LOW (ref 36.0–46.0)
Hemoglobin: 10 g/dL — ABNORMAL LOW (ref 12.0–15.0)
Hemoglobin: 10.5 g/dL — ABNORMAL LOW (ref 12.0–15.0)
Hemoglobin: 10.7 g/dL — ABNORMAL LOW (ref 12.0–15.0)
MCH: 26.7 pg (ref 26.0–34.0)
MCH: 26.9 pg (ref 26.0–34.0)
MCH: 26.4 pg (ref 26.0–34.0)
MCHC: 32.2 g/dL (ref 30.0–36.0)
MCHC: 32.6 g/dL (ref 30.0–36.0)
MCHC: 31.8 g/dL (ref 30.0–36.0)
MCV: 82.9 fL (ref 80.0–100.0)
MCV: 82.4 fL (ref 80.0–100.0)
MCV: 83 fL (ref 80.0–100.0)
Platelets: 287 10*3/uL (ref 150–400)
Platelets: 309 10*3/uL (ref 150–400)
Platelets: 296 10*3/uL (ref 150–400)
RBC: 3.75 MIL/uL — ABNORMAL LOW (ref 3.87–5.11)
RBC: 3.91 MIL/uL (ref 3.87–5.11)
RBC: 4.05 MIL/uL (ref 3.87–5.11)
RDW: 16.4 % — ABNORMAL HIGH (ref 11.5–15.5)
RDW: 16.6 % — ABNORMAL HIGH (ref 11.5–15.5)
RDW: 16.5 % — ABNORMAL HIGH (ref 11.5–15.5)
WBC: 9.7 10*3/uL (ref 4.0–10.5)
WBC: 9.2 10*3/uL (ref 4.0–10.5)
WBC: 8.9 10*3/uL (ref 4.0–10.5)
nRBC: 0 % (ref 0.0–0.2)
nRBC: 0 % (ref 0.0–0.2)
nRBC: 0 % (ref 0.0–0.2)

## 2019-08-31 LAB — BPAM RBC
Blood Product Expiration Date: 202104202359
Blood Product Expiration Date: 202104222359
ISSUE DATE / TIME: 202103240414
ISSUE DATE / TIME: 202103240843
Unit Type and Rh: 5100
Unit Type and Rh: 5100

## 2019-08-31 LAB — GLUCOSE, CAPILLARY
Glucose-Capillary: 143 mg/dL — ABNORMAL HIGH (ref 70–99)
Glucose-Capillary: 131 mg/dL — ABNORMAL HIGH (ref 70–99)
Glucose-Capillary: 143 mg/dL — ABNORMAL HIGH (ref 70–99)

## 2019-08-31 LAB — TYPE AND SCREEN
ABO/RH(D): O POS
Antibody Screen: NEGATIVE
Unit division: 0
Unit division: 0

## 2019-08-31 LAB — LIPID PANEL
Cholesterol: 165 mg/dL (ref 0–200)
HDL: 57 mg/dL (ref >40)
LDL Cholesterol: 85 mg/dL (ref 0–99)
Total CHOL/HDL Ratio: 2.9 RATIO
Triglycerides: 116 mg/dL (ref <150)
VLDL: 23 mg/dL (ref 0–40)

## 2019-08-31 LAB — HEMOGLOBIN A1C
Hgb A1c MFr Bld: 7.2 % — ABNORMAL HIGH (ref 4.8–5.6)
Mean Plasma Glucose: 159.94 mg/dL

## 2019-08-31 SURGERY — EGD (ESOPHAGOGASTRODUODENOSCOPY)
Anesthesia: General

## 2019-08-31 MED ORDER — PROPOFOL 500 MG/50ML IV EMUL
INTRAVENOUS | Status: DC | PRN
  Administered 2019-08-31: 75 ug/kg/min via INTRAVENOUS

## 2019-08-31 MED ORDER — PANTOPRAZOLE SODIUM 40 MG PO TBEC: 40.0000 mg | DELAYED_RELEASE_TABLET | Freq: Every day | ORAL | 1 refills | Status: AC

## 2019-08-31 MED ORDER — PROPOFOL 500 MG/50ML IV EMUL
INTRAVENOUS | Status: AC
  Filled 2019-08-31: qty 50

## 2019-08-31 MED ORDER — SODIUM CHLORIDE 0.9 % IV SOLN: INTRAVENOUS | Status: DC | PRN

## 2019-08-31 MED ORDER — FERROUS SULFATE 325 (65 FE) MG PO TBEC: 325.0000 mg | DELAYED_RELEASE_TABLET | Freq: Two times a day (BID) | ORAL | 3 refills | Status: DC

## 2019-08-31 MED ORDER — SODIUM CHLORIDE 0.9 % IV SOLN: INTRAVENOUS | Status: DC

## 2019-08-31 NOTE — Progress Notes (Signed)
Bedside shift report received from [place], [place]. Morning assessment completed. Pt. Resting calmly, arouses easily to name calling. Denies any pain or needs at this time. Assisted pt. To bathroom and back to bed. No other needs noted at this time, call light is within reach, will continue to monitor.

## 2019-08-31 NOTE — Progress Notes (Signed)
Morning medications held per NPO for procedure. Will administered medications upon return to floor.

## 2019-08-31 NOTE — Transfer of Care (Signed)
Immediate Anesthesia Transfer of Care Note  Patient: Erika Ochoa  Procedure(s) Performed: ESOPHAGOGASTRODUODENOSCOPY (EGD) (N/A )  Patient Location: PACU  Anesthesia Type:General  Level of Consciousness: awake and sedated  Airway & Oxygen Therapy: Patient Spontanous Breathing and Patient connected to nasal cannula oxygen  Post-op Assessment: Report given to RN and Post -op Vital signs reviewed and stable  Post vital signs: Reviewed and stable  Last Vitals:  Vitals Value Taken Time  BP    Temp    Pulse    Resp    SpO2      Last Pain:  Vitals:   08/31/19 1219  TempSrc: Temporal  PainSc: 0-No pain         Complications: No apparent anesthesia complications

## 2019-08-31 NOTE — Op Note (Signed)
[facility] Gastroenterology Patient Name: Erika Ochoa Procedure Date: 08/31/2019 12:19 PM MRN: [id number] Account #: [id number] Date of Birth: 07/20/1924 Admit Type: Inpatient Age: 86 Room: [facility] ENDO ROOM 3 Gender: Female Note Status: Finalized Procedure:             Upper GI endoscopy Indications:           Iron deficiency anemia due to suspected upper                         gastrointestinal bleeding Providers:             Benay Pike. Alyson Ki MD, MD Medicines:             Propofol per Anesthesia Complications:         No immediate complications. Estimated blood loss: None. Procedure:             Pre-Anesthesia Assessment:                        - The risks and benefits of the procedure and the                         sedation options and risks were discussed with the                         patient. All questions were answered and informed                         consent was obtained.                        - Patient identification and proposed procedure were                         verified prior to the procedure by the nurse. The                         procedure was verified in the procedure room.                        - ASA Grade Assessment: III - A patient with severe                         systemic disease.                        - After reviewing the risks and benefits, the patient                         was deemed in satisfactory condition to undergo the                         procedure.                        After obtaining informed consent, the endoscope was                         passed under direct vision. Throughout the procedure,  the patient's blood pressure, pulse, and oxygen                         saturations were monitored continuously. The Endoscope                         was introduced through the mouth, and advanced to the                         third part of duodenum. The upper GI endoscopy was            accomplished without difficulty. The patient tolerated                         the procedure well. Findings:      A non-obstructing Schatzki ring was found in the distal esophagus.      A 3 cm hiatal hernia was present.      Patchy mild inflammation characterized by erosions and erythema was       found in the gastric antrum.      The examined duodenum was normal.      The exam was otherwise without abnormality. Impression:            - Non-obstructing Schatzki ring.                        - 3 cm hiatal hernia.                        - Gastritis.                        - Normal examined duodenum.                        - The examination was otherwise normal.                        - No specimens collected. Recommendation:        - Return patient to hospital ward for ongoing care.                        - Resume previous diet. Procedure Code(s):     --- Professional ---                        [phone], Esophagogastroduodenoscopy, flexible,                         transoral; diagnostic, including collection of                         specimen(s) by brushing or washing, when performed                         (separate procedure) Diagnosis Code(s):     --- Professional ---                        D50.9, Iron deficiency anemia, unspecified                        K29.70, Gastritis,  unspecified, without bleeding                        K44.9, Diaphragmatic hernia without obstruction or                         gangrene                        K22.2, Esophageal obstruction CPT copyright 2019 American Medical Association. All rights reserved. The codes documented in this report are preliminary and upon coder review may  be revised to meet current compliance requirements. Efrain Sella MD, MD 08/31/2019 12:46:34 PM This report has been signed electronically. Number of Addenda: 0 Note Initiated On: 08/31/2019 12:19 PM Estimated Blood Loss:  Estimated blood loss: none.      [facility]

## 2019-08-31 NOTE — Anesthesia Preprocedure Evaluation (Signed)
Anesthesia Evaluation  Patient identified by MRN, date of birth, ID band Patient awake    Reviewed: Allergy & Precautions, H&P , NPO status , Patient's Chart, lab work & pertinent test results, reviewed documented beta blocker date and time   Airway Mallampati: II   Neck ROM: full    Dental  (+) Upper Dentures, Lower Dentures   Pulmonary neg pulmonary ROS,    Pulmonary exam normal        Cardiovascular Exercise Tolerance: Poor hypertension, On Medications + CAD  Normal cardiovascular exam Rhythm:regular Rate:Normal     Neuro/Psych TIA Neuromuscular disease negative psych ROS   GI/Hepatic negative GI ROS, Neg liver ROS,   Endo/Other  diabetes, Well Controlled, Type 2, Oral Hypoglycemic AgentsHypothyroidism   Renal/GU Renal disease  negative genitourinary   Musculoskeletal   Abdominal   Peds  Hematology  (+) Blood dyscrasia, anemia ,   Anesthesia Other Findings Past Medical History: No date: Abnormal mammogram, unspecified No date: Actinic keratosis No date: Breast screening, unspecified No date: Diabetes ([place]) No date: Hypertension No date: Mammographic microcalcification 06/14/2019: Squamous cell carcinoma of skin     Comment:  Left superior forehead. Well differentiated. Tx: EDC No date: Vaginal pessary present Past Surgical History: 2013: BREAST BIOPSY; Left     Comment:  stereo calcs No date: BREAST EXCISIONAL BIOPSY; Left     Comment:  "years ago"-benign 06/12/2018: ESOPHAGOGASTRODUODENOSCOPY; Left     Comment:  Procedure: ESOPHAGOGASTRODUODENOSCOPY (EGD);  Surgeon:               Virgel Manifold, MD;  Location: [facility] ENDOSCOPY;                Service: Endoscopy;  Laterality: Left; No date: SKIN LESION EXCISION BMI    Body Mass Index: 26.62 kg/m     Reproductive/Obstetrics negative OB ROS                             Anesthesia Physical Anesthesia Plan  ASA: IV and  emergent  Anesthesia Plan: General   Post-op Pain Management:    Induction:   PONV Risk Score and Plan:   Airway Management Planned:   Additional Equipment:   Intra-op Plan:   Post-operative Plan:   Informed Consent: I have reviewed the patients History and Physical, chart, labs and discussed the procedure including the risks, benefits and alternatives for the proposed anesthesia with the patient or authorized representative who has indicated his/her understanding and acceptance.     Dental Advisory Given  Plan Discussed with: CRNA  Anesthesia Plan Comments:         Anesthesia Quick Evaluation

## 2019-08-31 NOTE — Discharge Summary (Signed)
Physician Discharge Summary  Erika Ochoa HCW:[id number] DOB: 07-22-1924 DOA: 08/29/2019  PCP: Erika Pink, MD  Admit date: 08/29/2019 Discharge date: 08/31/2019  Admitted From: Home Disposition:  Home  Recommendations for Outpatient Follow-up:  1. Follow up with PCP in 1-2 weeks 2. Please obtain BMP/CBC in one week 3. Please follow up on the following pending results:None  Home Health: No Equipment/Devices: None Discharge Condition: Stable CODE STATUS: Partial with DNI Diet recommendation: Heart Healthy / Carb Modified    Brief/Interim Summary: Erika Ochoa is a 86 y.o. female with medical history significant of hypertension, hyperlipidemia, diabetes mellitus, TIA, hypothyroidism GI bleeding, CAD, atrial fibrillation not on anticoagulants, who presents with generalized weakness, fatigue, shortness of breath and heart racing.  She was found to have hemoglobin of 6.5 from 9.4 on 07/11/2018.  FOBT was positive.  She was given 2 units of PRBC and hemoglobin on discharge was 10.5.  He underwent EGD with gastroenterology which shows nonobstructing Schatzki's ring in distal esophagus, hiatal hernia and erosive gastritis.  She was discharged home on iron supplement along with Protonix.  We discontinued her aspirin and she was advised to avoid NSAID.  Will follow up with gastroenterology and her primary care physician for further management.  Patient also has an history of A. fib and she was in A. fib with RVR initially which was converted to sinus rhythm spontaneously.  She is not on any anticoagulation at home and will continue her home dose of metoprolol.  Patient has an history of coronary artery disease and found to have mildly elevated troponin most likely secondary to demand, peaked at 76 then started trending down.  No chest pain and EKG without any acute change.  Her A1c was 7.2, which shows mild improvement from her prior A1c of 7.4.  She will continue with her home meds.  Also has  CKD, creatinine initially within her baseline, improved to 1.53 on discharge with some IV fluid.  She was advised to keep herself well-hydrated.  Discharge Diagnoses:  Principal Problem:   Symptomatic anemia Active Problems:   A-fib (HCC)   CAD (coronary artery disease)   HLD (hyperlipidemia)   Type 2 diabetes mellitus with diabetic polyneuropathy, without long-term current use of insulin (HCC)   Essential (primary) hypertension   Adult hypothyroidism   TIA (transient ischemic attack)   Acute renal failure superimposed on stage 3a chronic kidney disease (HCC)   GIB (gastrointestinal bleeding)   Gout   Elevated troponin  Discharge Instructions  Discharge Instructions    Diet - low sodium heart healthy   Complete by: As directed    Discharge instructions   Complete by: As directed    It was pleasure taking care of you. We are stopping your aspirin and starting you on iron supplement and Protonix.  Please take it as directed. Please follow-up with gastroenterologist and your primary care physician. Avoid taking any ibuprofen or Aleve as it can cause bleeding from your stomach. If your self well-hydrated.   Increase activity slowly   Complete by: As directed      Allergies as of 08/31/2019      Reactions   Oxycodone Hcl Other (See Comments)   Other Reaction: GI Upset Other reaction(s): Other (See Comments) Other Reaction: GI Upset   Oxycodone-aspirin Other (See Comments)   Other Reaction: GI Upset (Per pt she does take aspirin 81 mg daily   Augmentin [amoxicillin-pot Clavulanate] Nausea Only   Codeine Nausea Only   Etodolac Other (See Comments)  Iodine Nausea Only   Levaquin [levofloxacin In D5w] Nausea Only   Levofloxacin Nausea Only   Metronidazole Nausea Only   Ultram [tramadol] Nausea Only   Sulfa Antibiotics Nausea Only, Other (See Comments)   Other reaction(s): Unknown      Medication List    STOP taking these medications   aspirin EC 81 MG tablet      TAKE these medications   allopurinol 100 MG tablet Commonly known as: ZYLOPRIM Take 100 mg by mouth daily.   cyanocobalamin 1000 MCG/ML injection Commonly known as: (VITAMIN B-12) Inject 1,000 mcg into the muscle every 30 (thirty) days.   ferrous sulfate 325 (65 FE) MG EC tablet Take 1 tablet (325 mg total) by mouth 2 (two) times daily.   glipiZIDE 5 MG 24 hr tablet Commonly known as: GLUCOTROL XL Take 5 mg by mouth 2 (two) times daily.   irbesartan-hydrochlorothiazide 300-12.5 MG tablet Commonly known as: AVALIDE Take 0.5 tablets by mouth daily.   levothyroxine 50 MCG tablet Commonly known as: SYNTHROID Take 50 mcg by mouth daily.   metoprolol tartrate 25 MG tablet Commonly known as: LOPRESSOR Take 25 mg by mouth 2 (two) times daily.   pantoprazole 40 MG tablet Commonly known as: Protonix Take 1 tablet (40 mg total) by mouth daily.   pioglitazone 30 MG tablet Commonly known as: ACTOS Take 30 mg by mouth daily.   Precision QID Test test strip Generic drug: glucose blood 1 each by Other route 2 (two) times daily.   ONE TOUCH ULTRA TEST test strip Generic drug: glucose blood 1 each by Other route as directed.   simvastatin 20 MG tablet Commonly known as: ZOCOR Take 20 mg by mouth every evening.   Tradjenta 5 MG Tabs tablet Generic drug: linagliptin Take 5 mg by mouth daily.   Vitamin D (Ergocalciferol) 1.25 MG (50000 UNIT) Caps capsule Commonly known as: DRISDOL Take 50,000 Units by mouth every Saturday.       Allergies  Allergen Reactions  . Oxycodone Hcl Other (See Comments)    Other Reaction: GI Upset Other reaction(s): Other (See Comments) Other Reaction: GI Upset   . Oxycodone-Aspirin Other (See Comments)    Other Reaction: GI Upset (Per pt she does take aspirin 81 mg daily  . Augmentin [Amoxicillin-Pot Clavulanate] Nausea Only  . Codeine Nausea Only  . Etodolac Other (See Comments)  . Iodine Nausea Only  . Levaquin [Levofloxacin In D5w]  Nausea Only  . Levofloxacin Nausea Only  . Metronidazole Nausea Only  . Ultram [Tramadol] Nausea Only  . Sulfa Antibiotics Nausea Only and Other (See Comments)    Other reaction(s): Unknown     Consultations:  Gastroenterology  Procedures/Studies: DG Chest Port 1 View  Result Date: 08/29/2019 CLINICAL DATA:  86 year old female with chest pain. Found to be in atrial fibrillation with RVR. EXAM: PORTABLE CHEST 1 VIEW COMPARISON:  Portable chest 12/19/2015 and earlier. FINDINGS: Portable AP upright view at 2223 hours. Chronic cardiomegaly. A moderate size gastric hiatal hernia appears larger. Calcified aortic atherosclerosis. Other mediastinal contours are within normal limits. Visualized tracheal air column is within normal limits. Increased pulmonary vascular congestion. No pneumothorax, pleural effusion or consolidation. No acute osseous abnormality identified. IMPRESSION: 1. Chronic cardiomegaly with increased pulmonary vascular congestion compared to 2017. Consider mild or developing interstitial edema. 2. Increased and moderate size gastric hiatal hernia. Electronically Signed   By: Genevie Ann M.D.   On: 08/29/2019 22:32     Subjective: Patient has no new complaints.  Denies any pain, nausea or vomiting.  She was accompanied by her son in the room.  Discharge Exam: Vitals:   08/31/19 1312 08/31/19 1421  BP: (!) 83/28   Pulse: 66   Resp: (!) 21 19  Temp:  98.6 F (37 C)  SpO2: 94%    Vitals:   08/31/19 1252 08/31/19 1302 08/31/19 1312 08/31/19 1421  BP: (!) 143/58  (!) 83/28   Pulse: 71 70 66   Resp: (!) 21 (!) 22 (!) 21 19  Temp: (!) 96.8 F (36 C)   98.6 F (37 C)  TempSrc: Temporal     SpO2: 93% 93% 94%   Weight:      Height:        General: Pt is alert, awake, not in acute distress Cardiovascular: RRR, S1/S2 +, no rubs, no gallops Respiratory: CTA bilaterally, no wheezing, no rhonchi Abdominal: Soft, NT, ND, bowel sounds + Extremities: no edema, no  cyanosis   The results of significant diagnostics from this hospitalization (including imaging, microbiology, ancillary and laboratory) are listed below for reference.    Microbiology: Recent Results (from the past 240 hour(s))  SARS CORONAVIRUS 2 (TAT 6-24 HRS) Nasopharyngeal Nasopharyngeal Swab     Status: None   Collection Time: 08/30/19  4:55 AM   Specimen: Nasopharyngeal Swab  Result Value Ref Range Status   SARS Coronavirus 2 NEGATIVE NEGATIVE Final    Comment: (NOTE) SARS-CoV-2 target nucleic acids are NOT DETECTED. The SARS-CoV-2 RNA is generally detectable in upper and lower respiratory specimens during the acute phase of infection. Negative results do not preclude SARS-CoV-2 infection, do not rule out co-infections with other pathogens, and should not be used as the sole basis for treatment or other patient management decisions. Negative results must be combined with clinical observations, patient history, and epidemiological information. The expected result is Negative. Fact Sheet for Patients: SugarRoll.be Fact Sheet for Healthcare Providers: https://www.woods-mathews.com/ This test is not yet approved or cleared by the [place] FDA and  has been authorized for detection and/or diagnosis of SARS-CoV-2 by FDA under an Emergency Use Authorization (EUA). This EUA will remain  in effect (meaning this test can be used) for the duration of the COVID-19 declaration under Section 56 4(b)(1) of the Act, 21 U.S.C. section 360bbb-3(b)(1), unless the authorization is terminated or revoked sooner. Performed at [facility], [place] [address] [place], [place] [address]      Labs: BNP (last 3 results) Recent Labs    08/30/19 0240  BNP 604.5*   Basic Metabolic Panel: Recent Labs  Lab 08/29/19 2248 08/31/19 0135  NA 139 138  K 3.9 3.8  CL 106 107  CO2 25 23  GLUCOSE 182* 139*  BUN 34* 23  CREATININE 1.71* 1.53*   CALCIUM 9.2 8.9  MG 2.0  --    Liver Function Tests: Recent Labs  Lab 08/29/19 2248  AST 22  ALT 17  ALKPHOS 53  BILITOT 0.6  PROT 6.6  ALBUMIN 3.6   No results for input(s): LIPASE, AMYLASE in the last 168 hours. No results for input(s): AMMONIA in the last 168 hours. CBC: Recent Labs  Lab 08/29/19 2248 08/29/19 2248 08/30/19 0240 08/30/19 0817 08/30/19 2007 08/31/19 0135 08/31/19 0825  WBC 8.6  --   --  8.4 10.8* 9.7 9.2  NEUTROABS 5.7  --   --   --   --   --   --   HGB 6.5*   < > 9.3* 7.5* 11.7*  10.0* 10.5*  HCT 21.2*   < > 30.5* 23.8* 36.6 31.1* 32.2*  MCV 83.8  --   --  83.2 83.4 82.9 82.4  PLT 331  --   --  313 343 287 309   < > = values in this interval not displayed.   Cardiac Enzymes: No results for input(s): CKTOTAL, CKMB, CKMBINDEX, TROPONINI in the last 168 hours. BNP: Invalid input(s): POCBNP CBG: Recent Labs  Lab 08/30/19 2122 08/30/19 2235 08/31/19 0735 08/31/19 1135 08/31/19 1225  GLUCAP 202* 200* 143* 143* 131*   D-Dimer No results for input(s): DDIMER in the last 72 hours. Hgb A1c Recent Labs    08/31/19 0135  HGBA1C 7.2*   Lipid Profile Recent Labs    08/31/19 0135  CHOL 165  HDL 57  LDLCALC 85  TRIG 116  CHOLHDL 2.9   Thyroid function studies Recent Labs    08/29/19 2248  TSH 3.725   Anemia work up No results for input(s): VITAMINB12, FOLATE, FERRITIN, TIBC, IRON, RETICCTPCT in the last 72 hours. Urinalysis    Component Value Date/Time   COLORURINE STRAW (A) 08/29/2019 2248   APPEARANCEUR HAZY (A) 08/29/2019 2248   APPEARANCEUR Clear 05/05/2014 1202   LABSPEC 1.006 08/29/2019 2248   LABSPEC 1.008 05/05/2014 1202   PHURINE 7.0 08/29/2019 2248   GLUCOSEU NEGATIVE 08/29/2019 2248   GLUCOSEU Negative 05/05/2014 1202   HGBUR NEGATIVE 08/29/2019 2248   BILIRUBINUR NEGATIVE 08/29/2019 2248   BILIRUBINUR Negative 05/05/2014 1202   KETONESUR NEGATIVE 08/29/2019 2248   PROTEINUR NEGATIVE 08/29/2019 2248   NITRITE  NEGATIVE 08/29/2019 2248   LEUKOCYTESUR SMALL (A) 08/29/2019 2248   LEUKOCYTESUR 1+ 05/05/2014 1202   Sepsis Labs Invalid input(s): PROCALCITONIN,  WBC,  LACTICIDVEN Microbiology Recent Results (from the past 240 hour(s))  SARS CORONAVIRUS 2 (TAT 6-24 HRS) Nasopharyngeal Nasopharyngeal Swab     Status: None   Collection Time: 08/30/19  4:55 AM   Specimen: Nasopharyngeal Swab  Result Value Ref Range Status   SARS Coronavirus 2 NEGATIVE NEGATIVE Final    Comment: (NOTE) SARS-CoV-2 target nucleic acids are NOT DETECTED. The SARS-CoV-2 RNA is generally detectable in upper and lower respiratory specimens during the acute phase of infection. Negative results do not preclude SARS-CoV-2 infection, do not rule out co-infections with other pathogens, and should not be used as the sole basis for treatment or other patient management decisions. Negative results must be combined with clinical observations, patient history, and epidemiological information. The expected result is Negative. Fact Sheet for Patients: SugarRoll.be Fact Sheet for Healthcare Providers: https://www.woods-mathews.com/ This test is not yet approved or cleared by the [place] FDA and  has been authorized for detection and/or diagnosis of SARS-CoV-2 by FDA under an Emergency Use Authorization (EUA). This EUA will remain  in effect (meaning this test can be used) for the duration of the COVID-19 declaration under Section 56 4(b)(1) of the Act, 21 U.S.C. section 360bbb-3(b)(1), unless the authorization is terminated or revoked sooner. Performed at [facility], [place] [address] [place], [place] [address]     Time coordinating discharge: Over 30 minutes  SIGNED:  Lorella Nimrod, MD  [facility] 08/31/2019, 2:32 PM  If 7PM-7AM, please contact night-coverage [email or web address]  This record has been created using Systems analyst. Errors have been  sought and corrected,but may not always be located. Such creation errors do not reflect on the standard of care.

## 2019-09-01 ENCOUNTER — Encounter: Payer: Self-pay | Admitting: *Deleted

## 2019-09-04 NOTE — Anesthesia Postprocedure Evaluation (Signed)
Anesthesia Post Note  Patient: Erika Ochoa  Procedure(s) Performed: ESOPHAGOGASTRODUODENOSCOPY (EGD) (N/A )  Patient location during evaluation: PACU Anesthesia Type: General Level of consciousness: awake and alert Pain management: pain level controlled Vital Signs Assessment: post-procedure vital signs reviewed and stable Respiratory status: spontaneous breathing, nonlabored ventilation, respiratory function stable and patient connected to nasal cannula oxygen Cardiovascular status: blood pressure returned to baseline and stable Postop Assessment: no apparent nausea or vomiting Anesthetic complications: no     Last Vitals:  Vitals:   08/31/19 1421 08/31/19 1500  BP:  (!) 179/51  Pulse:  65  Resp: 19 (!) 21  Temp: 37 C   SpO2:  92%    Last Pain:  Vitals:   08/31/19 1312  TempSrc:   PainSc: 0-No pain                 Molli Barrows

## 2019-09-13 ENCOUNTER — Other Ambulatory Visit: Payer: Self-pay

## 2019-09-13 ENCOUNTER — Ambulatory Visit: Payer: Medicare HMO | Admitting: Dermatology

## 2019-09-13 DIAGNOSIS — Z85828 Personal history of other malignant neoplasm of skin: Secondary | ICD-10-CM | POA: Diagnosis not present

## 2019-09-13 DIAGNOSIS — L578 Other skin changes due to chronic exposure to nonionizing radiation: Secondary | ICD-10-CM | POA: Diagnosis not present

## 2019-09-13 DIAGNOSIS — L570 Actinic keratosis: Secondary | ICD-10-CM

## 2019-09-13 DIAGNOSIS — D692 Other nonthrombocytopenic purpura: Secondary | ICD-10-CM | POA: Diagnosis not present

## 2019-09-13 DIAGNOSIS — D044 Carcinoma in situ of skin of scalp and neck: Secondary | ICD-10-CM | POA: Diagnosis not present

## 2019-09-13 DIAGNOSIS — D492 Neoplasm of unspecified behavior of bone, soft tissue, and skin: Secondary | ICD-10-CM

## 2019-09-13 NOTE — Progress Notes (Signed)
   Follow-Up Visit   Subjective  Mozell L Mattox is a 86 y.o. female who presents for the following: Skin Cancer (3 months f/u hx of SCC at L sup forehead).  She has a new spot on her forehead that is scabbing. She does not know how long its been there but it is not healing. The following portions of the chart were reviewed this encounter and updated as appropriate: Tobacco  Allergies  Meds  Problems  Med Hx  Surg Hx  Fam Hx      Review of Systems: No other skin or systemic complaints.  Objective  Well appearing patient in no apparent distress; mood and affect are within normal limits.  A focused examination was performed including face,arms. Relevant physical exam findings are noted in the Assessment and Plan.  Objective  (Face) (3): Erythematous thin papules/macules with gritty scale.   Objective  L sup forehead: Well healed scar with no evidence of recurrence, no lymphadenopathy.   Objective  L of midline medial forehead at hairline: 0.6 cm pink patch   Assessment & Plan   Actinic Damage - diffuse scaly erythematous macules with underlying dyspigmentation - Recommend daily broad spectrum sunscreen SPF 30+ to sun-exposed areas, reapply every 2 hours as needed.  - Call for new or changing lesions. Purpura - Violaceous macules and patches - Benign - Related to age, sun damage and/or use of blood thinners - Observe - Can use OTC arnica containing moisturizer such as Dermend Bruise Formula if desired - Call for worsening or other concerns  AK (actinic keratosis) (3) (Face)  Destruction of lesion - (Face)  History of SCC (squamous cell carcinoma) of skin L sup forehead  The patient will observe  Neoplasm of skin L of midline medial forehead at hairline  Skin / nail biopsy Type of biopsy: tangential   Informed consent: discussed and consent obtained   Patient was prepped and draped in usual sterile fashion: area prepped with alochol. Anesthesia: the lesion  was anesthetized in a standard fashion   Anesthetic:  1% lidocaine w/ epinephrine 1-100,000 buffered w/ 8.4% NaHCO3 Instrument used: flexible razor blade   Hemostasis achieved with: pressure, aluminum chloride and electrodesiccation   Outcome: patient tolerated procedure well   Post-procedure details: wound care instructions given   Post-procedure details comment:  Ointment and small bandage  Specimen 1 - Surgical pathology Differential Diagnosis: R/O BCC Check Margins: No 0.6 cm pink patch  Return in about 6 months (around 03/14/2020) for hx of SCC.   IMarye Round, CMA, am acting as scribe for Sarina Ser, MD .

## 2019-09-14 ENCOUNTER — Encounter: Payer: Self-pay | Admitting: Dermatology

## 2019-09-22 ENCOUNTER — Telehealth: Payer: Self-pay

## 2019-09-22 NOTE — Telephone Encounter (Signed)
Advised pt of bx results and advised pt we would txt the [facility] at her f/u appt on 10/24/19 appt./sh

## 2019-09-22 NOTE — Telephone Encounter (Signed)
-----   Message from Ralene Bathe, MD sent at 09/14/2019  7:21 PM EDT ----- Skin , left of midline medial forehead at hairline SQUAMOUS CELL CARCINOMA IN SITU, CRUSTED, BASE INVOLVED  Cancer - SCC in situ Superficial Schedule for [facility]

## 2019-10-24 ENCOUNTER — Other Ambulatory Visit: Payer: Self-pay

## 2019-10-24 ENCOUNTER — Ambulatory Visit: Payer: Medicare HMO | Admitting: Dermatology

## 2019-10-24 DIAGNOSIS — D0439 Carcinoma in situ of skin of other parts of face: Secondary | ICD-10-CM | POA: Diagnosis not present

## 2019-10-24 DIAGNOSIS — L578 Other skin changes due to chronic exposure to nonionizing radiation: Secondary | ICD-10-CM | POA: Diagnosis not present

## 2019-10-24 DIAGNOSIS — D099 Carcinoma in situ, unspecified: Secondary | ICD-10-CM

## 2019-10-24 NOTE — Patient Instructions (Signed)

## 2019-10-24 NOTE — Progress Notes (Signed)
   Follow-Up Visit   Subjective  Erika Ochoa is a 86 y.o. female who presents for the following: Skin Cancer (Biopsy proven SCC in situ of left medial forehead at hairline - treat today).     The following portions of the chart were reviewed this encounter and updated as appropriate:  Tobacco  Allergies  Meds  Problems  Med Hx  Surg Hx  Fam Hx      Review of Systems:  No other skin or systemic complaints except as noted in HPI or Assessment and Plan.  Objective  Well appearing patient in no apparent distress; mood and affect are within normal limits.  A focused examination was performed including face. Relevant physical exam findings are noted in the Assessment and Plan.  Objective  Left midline medial forehead at hairline: Healing biopsy site.   Assessment & Plan    Actinic Damage - diffuse scaly erythematous macules with underlying dyspigmentation - Recommend daily broad spectrum sunscreen SPF 30+ to sun-exposed areas, reapply every 2 hours as needed.  - Call for new or changing lesions.   Squamous cell carcinoma in situ Left midline medial forehead at hairline  Destruction of lesion Complexity: extensive   Destruction method: cryotherapy   Informed consent: discussed and consent obtained   Timeout:  patient name, date of birth, surgical site, and procedure verified Lesion destroyed using liquid nitrogen: Yes   Region frozen until ice ball extended beyond lesion: Yes   Outcome: patient tolerated procedure well with no complications   Post-procedure details: wound care instructions given   Posttreatment defect 1.1 cm  Return in about 3 months (around 01/24/2020).   I, Ashok Cordia, CMA, am acting as scribe for Sarina Ser, MD .  Documentation: I have reviewed the above documentation for accuracy and completeness, and I agree with the above.  Sarina Ser, MD

## 2019-10-25 ENCOUNTER — Encounter: Payer: Self-pay | Admitting: Dermatology

## 2019-11-05 ENCOUNTER — Emergency Department: Payer: Medicare HMO

## 2019-11-05 ENCOUNTER — Other Ambulatory Visit: Payer: Self-pay

## 2019-11-05 ENCOUNTER — Emergency Department
Admission: EM | Admit: 2019-11-05 | Discharge: 2019-11-05 | Disposition: A | Discharge status: Home / Self Care | Payer: Medicare HMO | Attending: Emergency Medicine | Admitting: Emergency Medicine

## 2019-11-05 DIAGNOSIS — I480 Paroxysmal atrial fibrillation: Secondary | ICD-10-CM | POA: Diagnosis not present

## 2019-11-05 DIAGNOSIS — R0789 Other chest pain: Secondary | ICD-10-CM | POA: Diagnosis present

## 2019-11-05 DIAGNOSIS — I10 Essential (primary) hypertension: Secondary | ICD-10-CM | POA: Insufficient documentation

## 2019-11-05 DIAGNOSIS — E119 Type 2 diabetes mellitus without complications: Secondary | ICD-10-CM | POA: Insufficient documentation

## 2019-11-05 DIAGNOSIS — R079 Chest pain, unspecified: Secondary | ICD-10-CM

## 2019-11-05 LAB — TROPONIN I (HIGH SENSITIVITY)
Troponin I (High Sensitivity): 18 ng/L — ABNORMAL HIGH (ref <18)
Troponin I (High Sensitivity): 34 ng/L — ABNORMAL HIGH (ref <18)

## 2019-11-05 LAB — BASIC METABOLIC PANEL
Anion gap: 9 (ref 5–15)
BUN: 26 mg/dL — ABNORMAL HIGH (ref 8–23)
CO2: 23 mmol/L (ref 22–32)
Calcium: 9.3 mg/dL (ref 8.9–10.3)
Chloride: 106 mmol/L (ref 98–111)
Creatinine, Ser: 1.54 mg/dL — ABNORMAL HIGH (ref 0.44–1.00)
GFR calc Af Amer: 33 mL/min — ABNORMAL LOW (ref >60)
GFR calc non Af Amer: 29 mL/min — ABNORMAL LOW (ref >60)
Glucose, Bld: 310 mg/dL — ABNORMAL HIGH (ref 70–99)
Potassium: 3.5 mmol/L (ref 3.5–5.1)
Sodium: 138 mmol/L (ref 135–145)

## 2019-11-05 LAB — CBC
HCT: 37.1 % (ref 36.0–46.0)
Hemoglobin: 12.6 g/dL (ref 12.0–15.0)
MCH: 29.7 pg (ref 26.0–34.0)
MCHC: 34 g/dL (ref 30.0–36.0)
MCV: 87.5 fL (ref 80.0–100.0)
Platelets: 259 10*3/uL (ref 150–400)
RBC: 4.24 MIL/uL (ref 3.87–5.11)
RDW: 23.5 % — ABNORMAL HIGH (ref 11.5–15.5)
WBC: 8.9 10*3/uL (ref 4.0–10.5)
nRBC: 0 % (ref 0.0–0.2)

## 2019-11-05 LAB — T4, FREE: Free T4: 0.94 ng/dL (ref 0.61–1.12)

## 2019-11-05 LAB — TSH: TSH: 5.134 u[IU]/mL — ABNORMAL HIGH (ref 0.350–4.500)

## 2019-11-05 MED ORDER — DILTIAZEM HCL 25 MG/5ML IV SOLN
10.0000 mg | Freq: Once | INTRAVENOUS | Status: AC
  Administered 2019-11-05: 10 mg via INTRAVENOUS
  Filled 2019-11-05: qty 5

## 2019-11-05 MED ORDER — SODIUM CHLORIDE 0.9 % IV BOLUS
500.0000 mL | Freq: Once | INTRAVENOUS | Status: AC
  Administered 2019-11-05: 500 mL via INTRAVENOUS

## 2019-11-05 MED ORDER — DILTIAZEM HCL 25 MG/5ML IV SOLN: 20.0000 mg | Freq: Once | INTRAVENOUS | Status: DC

## 2019-11-05 NOTE — Discharge Instructions (Addendum)
Take an extra metoprolol should the fast heartbeat come back. Make a follow up with Cardiology on Tuesday.

## 2019-11-05 NOTE — ED Provider Notes (Signed)
ER Provider Note       Time seen: 3:50 PM    I have reviewed the vital signs and the nursing notes.  HISTORY   Chief Complaint Chest Pain    HPI Erika Ochoa is a 86 y.o. female with a history of diabetes, hypertension who presents today for chest pain today on the left side.  Daughter-in-law told the patient that she needed to come because her heart was out of rhythm.  No history of atrial fibrillation but is in rapid atrial fibrillation on arrival.  Describes 2 out of 10 left-sided chest discomfort and also pain in her neck.  Past Medical History:  Diagnosis Date  . Abnormal mammogram, unspecified   . Actinic keratosis   . Breast screening, unspecified   . Diabetes ([place])   . Hypertension   . Mammographic microcalcification   . Squamous cell carcinoma of skin 06/14/2019   Left superior forehead. Well differentiated. Tx: EDC  . Vaginal pessary present     Past Surgical History:  Procedure Laterality Date  . BREAST BIOPSY Left 2013   stereo calcs  . BREAST EXCISIONAL BIOPSY Left    "years ago"-benign  . ESOPHAGOGASTRODUODENOSCOPY Left 06/12/2018   Procedure: ESOPHAGOGASTRODUODENOSCOPY (EGD);  Surgeon: Virgel Manifold, MD;  Location: [facility] ENDOSCOPY;  Service: Endoscopy;  Laterality: Left;  . ESOPHAGOGASTRODUODENOSCOPY N/A 08/31/2019   Procedure: ESOPHAGOGASTRODUODENOSCOPY (EGD);  Surgeon: Toledo, Benay Pike, MD;  Location: [facility] ENDOSCOPY;  Service: Gastroenterology;  Laterality: N/A;  . SKIN LESION EXCISION      Allergies Oxycodone hcl, Oxycodone-aspirin, Augmentin [amoxicillin-pot clavulanate], Codeine, Etodolac, Iodine, Levaquin [levofloxacin in d5w], Levofloxacin, Metronidazole, Ultram [tramadol], and Sulfa antibiotics   Review of Systems Constitutional: Negative for fever. Cardiovascular: Positive for chest pain, palpitations Respiratory: Negative for shortness of breath. Gastrointestinal: Negative for abdominal pain, vomiting and diarrhea. Musculoskeletal:  Negative for back pain. Skin: Negative for rash. Neurological: Negative for headaches, focal weakness or numbness.  All systems negative/normal/unremarkable except as stated in the HPI  ____________________________________________   PHYSICAL EXAM:  VITAL SIGNS: Vitals:   11/05/19 1540 11/05/19 1541  BP: 125/79   Pulse: (!) 142   Resp: 18   Temp:  98.2 F (36.8 C)  SpO2: 97%     Constitutional: Alert and oriented. Well appearing and in no distress. Eyes: Conjunctivae are normal. Normal extraocular movements. ENT      Head: Normocephalic and atraumatic.      Nose: No congestion/rhinnorhea.      Mouth/Throat: Mucous membranes are moist.      Neck: No stridor. Cardiovascular: Rapid rate, irregular rhythm. No murmurs, rubs, or gallops. Respiratory: Normal respiratory effort without tachypnea nor retractions. Breath sounds are clear and equal bilaterally. No wheezes/rales/rhonchi. Gastrointestinal: Soft and nontender. Normal bowel sounds Musculoskeletal: Nontender with normal range of motion in extremities. No lower extremity tenderness nor edema. Neurologic:  Normal speech and language. No gross focal neurologic deficits are appreciated.  Skin:  Skin is warm, dry and intact. No rash noted. Psychiatric: Speech and behavior are normal.  ____________________________________________  EKG: Interpreted by me.  Atrial fibrillation with rapid ventricular response, rate is 164 bpm, right bundle branch block, normal QT  Repeat EKG interpreted by me, sinus rhythm with a rate of 66 bpm, right bundle branch block, normal QT ____________________________________________   LABS (pertinent positives/negatives)  Labs Reviewed  BASIC METABOLIC PANEL - Abnormal; Notable for the following components:      Result Value   Glucose, Bld 310 (*)    BUN 26 (*)  Creatinine, Ser 1.54 (*)    GFR calc non Af Amer 29 (*)    GFR calc Af Amer 33 (*)    All other components within normal limits  CBC  - Abnormal; Notable for the following components:   RDW 23.5 (*)    All other components within normal limits  TSH - Abnormal; Notable for the following components:   TSH 5.134 (*)    All other components within normal limits  TROPONIN I (HIGH SENSITIVITY) - Abnormal; Notable for the following components:   Troponin I (High Sensitivity) 18 (*)    All other components within normal limits  T4, FREE  TROPONIN I (HIGH SENSITIVITY)   CRITICAL CARE Performed by: Laurence Aly   Total critical care time: 30 minutes  Critical care time was exclusive of separately billable procedures and treating other patients.  Critical care was necessary to treat or prevent imminent or life-threatening deterioration.  Critical care was time spent personally by me on the following activities: development of treatment plan with patient and/or surrogate as well as nursing, discussions with consultants, evaluation of patient's response to treatment, examination of patient, obtaining history from patient or surrogate, ordering and performing treatments and interventions, ordering and review of laboratory studies, ordering and review of radiographic studies, pulse oximetry and re-evaluation of patient's condition.  RADIOLOGY  Images were viewed by me Chest x-ray  DIFFERENTIAL DIAGNOSIS  Unstable angina, MI, arrhythmia, electrolyte abnormality  ASSESSMENT AND PLAN  Chest pain, atrial fibrillation   Plan: The patient had presented for chest pain and new onset atrial fibrillation.  She was given IV Cardizem 10 mg for rate control.  Patient's labs were overall unremarkable although she did have mild hyperglycemia.  She spontaneously converted and did not require any additional doses of Cardizem.  Symptoms seem to have resolved.  I will advise close outpatient follow-up with cardiology, I will encourage extra metoprolol should symptoms recur.  Lenise Arena MD    Note: This note was generated in  part or whole with voice recognition software. Voice recognition is usually quite accurate but there are transcription errors that can and very often do occur. I apologize for any typographical errors that were not detected and corrected.     Earleen Newport, MD 11/05/19 [phone]

## 2019-11-05 NOTE — ED Triage Notes (Signed)
Pt began with CP today on L side. Daughter in law told pt that she needed to come because "her heart is out of rhythm" no hx of a fib but a fib rvr in triage. HR up to 160's. A&O.

## 2019-11-07 ENCOUNTER — Emergency Department
Admission: EM | Admit: 2019-11-07 | Discharge: 2019-11-07 | Disposition: A | Discharge status: Home / Self Care | Payer: Medicare HMO | Attending: Emergency Medicine | Admitting: Emergency Medicine

## 2019-11-07 ENCOUNTER — Emergency Department: Payer: Medicare HMO

## 2019-11-07 ENCOUNTER — Encounter: Payer: Self-pay | Admitting: Emergency Medicine

## 2019-11-07 ENCOUNTER — Other Ambulatory Visit: Payer: Self-pay

## 2019-11-07 DIAGNOSIS — R0602 Shortness of breath: Secondary | ICD-10-CM | POA: Insufficient documentation

## 2019-11-07 DIAGNOSIS — E119 Type 2 diabetes mellitus without complications: Secondary | ICD-10-CM | POA: Insufficient documentation

## 2019-11-07 DIAGNOSIS — I499 Cardiac arrhythmia, unspecified: Secondary | ICD-10-CM | POA: Diagnosis present

## 2019-11-07 DIAGNOSIS — I4891 Unspecified atrial fibrillation: Secondary | ICD-10-CM | POA: Insufficient documentation

## 2019-11-07 DIAGNOSIS — I10 Essential (primary) hypertension: Secondary | ICD-10-CM | POA: Diagnosis not present

## 2019-11-07 LAB — URINALYSIS, COMPLETE (UACMP) WITH MICROSCOPIC
Bacteria, UA: NONE SEEN
Bilirubin Urine: NEGATIVE
Glucose, UA: >=500 mg/dL — AB
Hgb urine dipstick: NEGATIVE
Ketones, ur: NEGATIVE mg/dL
Nitrite: NEGATIVE
Protein, ur: NEGATIVE mg/dL
Specific Gravity, Urine: 1.002 — ABNORMAL LOW (ref 1.005–1.030)
pH: 5 (ref 5.0–8.0)

## 2019-11-07 LAB — BASIC METABOLIC PANEL
Anion gap: 9 (ref 5–15)
BUN: 25 mg/dL — ABNORMAL HIGH (ref 8–23)
CO2: 23 mmol/L (ref 22–32)
Calcium: 9.4 mg/dL (ref 8.9–10.3)
Chloride: 105 mmol/L (ref 98–111)
Creatinine, Ser: 1.61 mg/dL — ABNORMAL HIGH (ref 0.44–1.00)
GFR calc Af Amer: 32 mL/min — ABNORMAL LOW (ref >60)
GFR calc non Af Amer: 27 mL/min — ABNORMAL LOW (ref >60)
Glucose, Bld: 276 mg/dL — ABNORMAL HIGH (ref 70–99)
Potassium: 3.9 mmol/L (ref 3.5–5.1)
Sodium: 137 mmol/L (ref 135–145)

## 2019-11-07 LAB — CBC
HCT: 37.1 % (ref 36.0–46.0)
Hemoglobin: 12.5 g/dL (ref 12.0–15.0)
MCH: 29.7 pg (ref 26.0–34.0)
MCHC: 33.7 g/dL (ref 30.0–36.0)
MCV: 88.1 fL (ref 80.0–100.0)
Platelets: 257 10*3/uL (ref 150–400)
RBC: 4.21 MIL/uL (ref 3.87–5.11)
RDW: 23.3 % — ABNORMAL HIGH (ref 11.5–15.5)
WBC: 10.3 10*3/uL (ref 4.0–10.5)
nRBC: 0 % (ref 0.0–0.2)

## 2019-11-07 LAB — TROPONIN I (HIGH SENSITIVITY)
Troponin I (High Sensitivity): 44 ng/L — ABNORMAL HIGH (ref <18)
Troponin I (High Sensitivity): 38 ng/L — ABNORMAL HIGH (ref <18)

## 2019-11-07 LAB — TSH: TSH: 4.251 u[IU]/mL (ref 0.350–4.500)

## 2019-11-07 NOTE — ED Notes (Signed)
EDP Siadecki to bedside.

## 2019-11-07 NOTE — Discharge Instructions (Addendum)
Continue to take all of your medications as prescribed.  Follow-up with Dr. Nehemiah Massed.  You should call his office tomorrow morning to let them know that you were in the emergency department and need to reschedule.  Return to the emergency department immediately for new, worsening, recurrent palpitations, chest pain, difficulty breathing, choking sensation, or any other new or worsening symptoms that concern you.

## 2019-11-07 NOTE — ED Provider Notes (Signed)
-----------------------------------------   4:58 PM on 11/07/2019 -----------------------------------------  I took over care on this patient from Dr. Cinda Quest.  The patient was pending a repeat troponin.  Repeat troponin is trending down and is at a similar level to labs from prior visits.  The patient continues to be asymptomatic with a normal heart rate.  She is eager to go home.  I consulted Dr. Saralyn Pilar from cardiology who advised that the patient could take an extra dose of metoprolol if she has another episode of rapid atrial fibrillation that does not spontaneously resolve in 10 to 15 minutes.  She should call the office tomorrow to reschedule her appointment.  The patient agrees with this plan.  I gave her thorough return precautions and she expressed understanding.  She is stable for discharge at this time.     Arta Silence, MD 11/07/19 [phone]

## 2019-11-07 NOTE — ED Provider Notes (Signed)
[facility] Emergency Department Provider Note   ____________________________________________   First MD Initiated Contact with Patient 11/07/19 1343     (approximate)  I have reviewed the triage vital signs and the nursing notes.   HISTORY  Chief Complaint Irregular Heart Beat and Shortness of Breath    HPI Erika Ochoa is a 86 y.o. female who reports when she woke up this morning she felt fine but then just before lunch she felt jerky and rapid heartbeat.  Now she feels very tired and has been feeling tired ever since that started.  She really does not have any chest pain.  She was short of breath with the other symptoms but that has since resolved.  She denies any fever coughing dysuria or frequency or abdominal pain.  She did not have any chest discomfort.        Past Medical History:  Diagnosis Date  . Abnormal mammogram, unspecified   . Actinic keratosis   . Breast screening, unspecified   . Diabetes ([place])   . Hypertension   . Mammographic microcalcification   . Squamous cell carcinoma of skin 06/14/2019   Left superior forehead. Well differentiated. Tx: EDC  . Vaginal pessary present     Patient Active Problem List   Diagnosis Date Noted  . Symptomatic anemia 08/30/2019  . Gout 08/30/2019  . Elevated troponin 08/30/2019  . GIB (gastrointestinal bleeding) 06/10/2018  . Cellulitis of leg, left 03/07/2018  . Bilateral edema of lower extremity 03/07/2018  . Bilateral lower extremity pain 03/07/2018  . Cystocele, midline 08/12/2016  . Uterovaginal prolapse, incomplete 08/12/2016  . History of transient ischemic attack (TIA) 02/05/2016  . TIA (transient ischemic attack) 12/19/2015  . Acute renal failure superimposed on stage 3a chronic kidney disease ([place]) 12/19/2015  . Primary osteoarthritis of both first carpometacarpal joints 07/24/2015  . B12 deficiency 02/25/2015  . A-fib ([place]) 07/11/2014  . Cervical pain 07/11/2014  . CAD  (coronary artery disease) 07/11/2014  . Benign essential HTN 07/11/2014  . HLD (hyperlipidemia) 07/11/2014  . Type 2 diabetes mellitus with diabetic polyneuropathy, without long-term current use of insulin ([place]) 07/11/2014  . Essential (primary) hypertension 07/11/2014  . Adult hypothyroidism 07/11/2014    Past Surgical History:  Procedure Laterality Date  . BREAST BIOPSY Left 2013   stereo calcs  . BREAST EXCISIONAL BIOPSY Left    "years ago"-benign  . ESOPHAGOGASTRODUODENOSCOPY Left 06/12/2018   Procedure: ESOPHAGOGASTRODUODENOSCOPY (EGD);  Surgeon: Virgel Manifold, MD;  Location[facility] ENDOSCOPY;  Service: Endoscopy;  Laterality: Left;  . ESOPHAGOGASTRODUODENOSCOPY N/A 08/31/2019   Procedure: ESOPHAGOGASTRODUODENOSCOPY (EGD);  Surgeon: Toledo, Benay Pike, MD;  Location: [facility] ENDOSCOPY;  Service: Gastroenterology;  Laterality: N/A;  . SKIN LESION EXCISION      Prior to Admission medications   Medication Sig Start Date End Date Taking? Authorizing Provider  allopurinol (ZYLOPRIM) 100 MG tablet Take 100 mg by mouth daily. 08/23/19  Yes [provider]  ferrous sulfate 325 (65 FE) MG EC tablet Take 1 tablet (325 mg total) by mouth 2 (two) times daily. 08/31/19 08/30/20 Yes Lorella Nimrod, MD  glipiZIDE (GLUCOTROL XL) 5 MG 24 hr tablet Take 5 mg by mouth 2 (two) times daily.  07/01/18  Yes [provider]  levothyroxine (SYNTHROID, LEVOTHROID) 50 MCG tablet Take 50 mcg by mouth daily.    Yes [provider]  metoprolol tartrate (LOPRESSOR) 25 MG tablet Take 25 mg by mouth 2 (two) times daily.   Yes [provider]  pantoprazole (PROTONIX) 40 MG tablet Take 1 tablet (40 mg total) by mouth daily. 08/31/19 08/30/20 Yes Lorella Nimrod, MD  pioglitazone (ACTOS) 30 MG tablet Take 30 mg by mouth daily. 07/12/19  Yes [provider]  simvastatin (ZOCOR) 20 MG tablet Take 20 mg by mouth every evening.    Yes [provider]  TRADJENTA 5 MG TABS tablet  Take 5 mg by mouth daily.  07/01/18  Yes [provider]  Vitamin D, Ergocalciferol, (DRISDOL) 1.25 MG (50000 UT) CAPS capsule Take 50,000 Units by mouth every Saturday.  08/04/18  Yes [provider]  cyanocobalamin (,VITAMIN B-12,) 1000 MCG/ML injection Inject 1,000 mcg into the muscle every 30 (thirty) days.  10/25/15   [provider]  irbesartan-hydrochlorothiazide (AVALIDE) 300-12.5 MG tablet Take 1 tablet by mouth daily.     [provider]    Allergies Oxycodone hcl, Oxycodone-aspirin, Augmentin [amoxicillin-pot clavulanate], Codeine, Etodolac, Iodine, Levaquin [levofloxacin in d5w], Levofloxacin, Metronidazole, Ultram [tramadol], and Sulfa antibiotics  Family History  Problem Relation Age of Onset  . Cancer Mother   . Breast cancer Neg Hx     Social History Social History   Tobacco Use  . Smoking status: Never Smoker  . Smokeless tobacco: Never Used  Substance Use Topics  . Alcohol use: No    Alcohol/week: 0.0 standard drinks  . Drug use: No    Review of Systems  Constitutional: No fever/chills Eyes: No visual changes. ENT: No sore throat. Cardiovascular: Denies chest pain. Respiratory:  shortness of breath see HPI for details. Gastrointestinal: No abdominal pain.  No nausea, no vomiting.  No diarrhea.  No constipation. Genitourinary: Negative for dysuria. Musculoskeletal: Negative for back pain. Skin: Negative for rash. Neurological: Negative for headaches, focal weakness  ____________________________________________   PHYSICAL EXAM:  VITAL SIGNS: ED Triage Vitals  Enc Vitals Group     BP 11/07/19 1311 (!) 152/99     Pulse Rate 11/07/19 1311 (!) 149     Resp 11/07/19 1311 20     Temp 11/07/19 1315 98.2 F (36.8 C)     Temp Source 11/07/19 1315 Oral     SpO2 11/07/19 1311 98 %     Weight 11/07/19 1312 140 lb (63.5 kg)     Height 11/07/19 1312 5\' 6"  (1.676 m)     Head Circumference --      Peak Flow --      Pain Score  11/07/19 1312 0     Pain Loc --      Pain Edu? --      Excl. in [place]? --     Constitutional: Alert and oriented. Well appearing and in no acute distress. Eyes: Conjunctivae are normal. PER Head: Atraumatic. Nose: No congestion/rhinnorhea. Mouth/Throat: Mucous membranes are moist.  Oropharynx non-erythematous. Neck: No stridor.   Cardiovascular: Normal rate, regular rhythm. Grossly normal heart sounds.  Good peripheral circulation. Respiratory: Normal respiratory effort.  No retractions. Lungs CTAB. Gastrointestinal: Soft and nontender. No distention. No abdominal bruits. No CVA tenderness. Musculoskeletal: No lower extremity tenderness nor edema.  Neurologic:  Normal speech and language. No gross focal neurologic deficits are appreciated.  Skin:  Skin is warm, dry and intact. No rash noted. .  ____________________________________________   LABS (all labs ordered are listed, but only abnormal results are displayed)  Labs Reviewed  CBC - Abnormal; Notable for the following components:      Result Value   RDW 23.3 (*)    All other components within normal limits  BASIC METABOLIC PANEL - Abnormal; Notable for the following components:   Glucose, Bld 276 (*)    BUN 25 (*)    Creatinine, Ser 1.61 (*)    GFR calc non Af Amer 27 (*)    GFR calc Af Amer 32 (*)    All other components within normal limits  URINALYSIS, COMPLETE (UACMP) WITH MICROSCOPIC - Abnormal; Notable for the following components:   Color, Urine STRAW (*)    APPearance CLEAR (*)    Specific Gravity, Urine 1.002 (*)    Glucose, UA >=500 (*)    Leukocytes,Ua SMALL (*)    All other components within normal limits  TROPONIN I (HIGH SENSITIVITY) - Abnormal; Notable for the following components:   Troponin I (High Sensitivity) 44 (*)    All other components within normal limits  TROPONIN I (HIGH SENSITIVITY) - Abnormal; Notable for the following components:   Troponin I (High Sensitivity) 38 (*)    All other  components within normal limits  TSH  TROPONIN I (HIGH SENSITIVITY)   ____________________________________________  EKG  Initial EKG read interpreted by me shows A. fib with rapid rapid ventricular response at a rate of 156 normal axis right bundle branch block nonspecific ST-T wave changes ____________________________________________  RADIOLOGY  ED MD interpretation: Chest x-ray read and interpreted by radiologist and reviewed by me shows unchanged cardiomegaly with chronic vascular congestion very mild in my opinion  Official radiology report(s): DG Chest 2 View  Result Date: 11/07/2019 CLINICAL DATA:  Chest pain. EXAM: CHEST - 2 VIEW COMPARISON:  Prior chest radiographs 11/05/2019 and earlier FINDINGS: Unchanged cardiomegaly. Aortic atherosclerosis. Redemonstrated moderate-sized hiatal hernia containing an air-fluid level. Mild chronic central pulmonary vascular congestion. Mild linear opacities within the lung bases has an appearance most suggestive of atelectasis and/or scarring. No appreciable airspace consolidation. No evidence of pleural effusion or pneumothorax. No acute bony abnormality identified. IMPRESSION: Unchanged cardiomegaly with mild chronic central pulmonary vascular congestion. Aortic Atherosclerosis (ICD10-I70.0). Mild linear opacities within the lung bases have an appearance most suggestive of atelectasis and/or scarring. No appreciable airspace consolidation. Redemonstrated moderate-sized hiatal hernia containing an air-fluid level. Electronically Signed   By: Kellie Simmering DO   On: 11/07/2019 13:41    ____________________________________________   PROCEDURES  Procedure(s) performed (including Critical Care): Cardiac monitor reviewed shows now a normal sinus rhythm at 89-90 goes up to 93 when patient sits up and becomes active otherwise looks good no acute ST-T changes pulse ox 98% still.  Procedures   ____________________________________________   INITIAL  IMPRESSION / ASSESSMENT AND PLAN / ED COURSE Patient's troponin is slightly elevated at 44 this is slightly higher than previous readings however could be due to her A. fib.  She is not having any further chest pain just tiredness.  We will repeat the troponin after 2 hours and see how it is doing.  She had an appointment with Dr. Nehemiah Massed her cardiologist today.  We will see if we can give them a call as soon as we get the second troponin back and let them know what is going on.    ----------------------------------------- 4:04 PM on 11/07/2019 -----------------------------------------  Second troponin is pending I am going to sign out this patient she is stable currently.  She is in sinus rhythm.         ____________________________________________   FINAL CLINICAL IMPRESSION(S) / ED DIAGNOSES  Final diagnoses:  Atrial fibrillation with RVR [facility])     ED Discharge Orders    None  Note:  This document was prepared using Dragon voice recognition software and may include unintentional dictation errors.    Nena Polio, MD 11/07/19 [phone]

## 2019-11-07 NOTE — ED Notes (Signed)
Pt given another warm blanket.  

## 2019-11-07 NOTE — ED Notes (Signed)
Pt's R ac IV removed.

## 2019-11-07 NOTE — ED Notes (Addendum)
Pt repositioned in bed by this RN and Marya Amsler, Therapist, sports. Pt reports had "fluttering" in her chest Sunday. Reports today fluttering came back with nausea accompanying it and that daughter stated she could "hear" patient's heart "from across the room". Pt denies CP. Resp reg/unlabored. Skin dry. Resting calmly in bed. Has warm blanket. Bed locked low. Rails up. Call bell within reach.

## 2019-11-07 NOTE — ED Triage Notes (Signed)
Pt reports felt "choky" today right before lunch. Pt states that she also felt like her heart was beating fast.

## 2019-11-07 NOTE — ED Notes (Addendum)
Pt assisted up to bedside toilet to urinate. Sample sent to lab. Pt steady. Denies use of cane or walker at home. Family at bedside.

## 2019-11-07 NOTE — ED Notes (Addendum)
Called lab about missing repeat trop results. State it is still running. EDP Melinda verbal hold on 15:58 trop until 2nd trop back. Will adjust times if 3rd trop still needed. Pt given brief update. Son remains at bedside.

## 2019-11-21 ENCOUNTER — Ambulatory Visit: Payer: Medicare HMO | Admitting: Obstetrics & Gynecology

## 2019-11-21 ENCOUNTER — Other Ambulatory Visit: Payer: Self-pay

## 2019-11-21 ENCOUNTER — Encounter: Payer: Self-pay | Admitting: Obstetrics & Gynecology

## 2019-11-21 VITALS — BP 120/80 | Ht 66.5 in | Wt 158.0 lb

## 2019-11-21 DIAGNOSIS — N812 Incomplete uterovaginal prolapse: Secondary | ICD-10-CM

## 2019-11-21 DIAGNOSIS — N8111 Cystocele, midline: Secondary | ICD-10-CM

## 2019-11-21 NOTE — Patient Instructions (Signed)
Thank you for choosing [facility]. As part of our ongoing efforts to improve patient experience, we would appreciate your feedback. Please fill out the short survey that you will receive by mail or MyChart. Your opinion is important to [place]!

## 2019-11-21 NOTE — Progress Notes (Signed)
HPI:      [name] is a 86 y.o. G2P0011 who presents today for her pessary follow up and examination related to her pelvic floor weakening.  Pt reports tolerating the pessary well with no vaginal bleeding and no vaginal discharge.  Symptoms of pelvic floor weakening have greatly improved. She is voiding and defecating without difficulty. She currently has a Ring #4 pessary.  PMHx: She  has a past medical history of Abnormal mammogram, unspecified, Actinic keratosis, Breast screening, unspecified, Diabetes ([place]), Hypertension, Mammographic microcalcification, Squamous cell carcinoma of skin (06/14/2019), and Vaginal pessary present. Also,  has a past surgical history that includes Skin lesion excision; Breast biopsy (Left, 2013); Breast excisional biopsy (Left); Esophagogastroduodenoscopy (Left, 06/12/2018); and Esophagogastroduodenoscopy (N/A, 08/31/2019)., family history includes Cancer in her mother.,  reports that she has never smoked. She has never used smokeless tobacco. She reports that she does not drink alcohol and does not use drugs.  She has a current medication list which includes the following prescription(s): allopurinol, aspirin ec, cyanocobalamin, ferrous sulfate, glipizide, irbesartan-hydrochlorothiazide, levothyroxine, metoprolol tartrate, pantoprazole, pioglitazone, simvastatin, tradjenta, and vitamin d (ergocalciferol). Also, is allergic to oxycodone hcl, oxycodone-aspirin, augmentin [amoxicillin-pot clavulanate], codeine, etodolac, iodine, levaquin [levofloxacin in d5w], levofloxacin, metronidazole, ultram [tramadol], and sulfa antibiotics.  Review of Systems  All other systems reviewed and are negative.   Objective: BP 120/80   Ht 5' 6.5" (1.689 m)   Wt 158 lb (71.7 kg)   BMI 25.12 kg/m  Physical Exam Constitutional:      General: She is not in acute distress.    Appearance: She is well-developed.  Genitourinary:     Pelvic exam was performed with patient supine.       Vagina and uterus normal.     No vaginal erythema or bleeding.     No cervical motion tenderness, discharge, polyp or nabothian cyst.     Uterus is mobile.     Uterus is not enlarged.     No uterine mass detected.    Uterus is midaxial.     No right or left adnexal mass present.     Right adnexa not tender.     Left adnexa not tender.     Genitourinary Comments: Mild atrophy Prolapse and cystocele present   HENT:     Head: Normocephalic and atraumatic.     Nose: Nose normal.  Abdominal:     General: There is no distension.     Palpations: Abdomen is soft.     Tenderness: There is no abdominal tenderness.  Musculoskeletal:        General: Normal range of motion.  Neurological:     Mental Status: She is alert and oriented to person, place, and time.     Cranial Nerves: No cranial nerve deficit.  Skin:    General: Skin is warm and dry.  Psychiatric:        Attention and Perception: Attention normal.        Mood and Affect: Mood and affect normal.        Speech: Speech normal.        Behavior: Behavior normal.        Thought Content: Thought content normal.        Judgment: Judgment normal.     Pessary Care Pessary removed and cleaned.  Vagina checked - without erosions - pessary replaced.  A/P:   ICD-10-CM   1. Uterovaginal prolapse, incomplete  N81.2   2. Cystocele, midline  N81.11  Pessary was cleaned and replaced today. Instructions given for care. Concerning symptoms to observe for are counseled to patient. Follow up scheduled for 3 months.  A total of 20 minutes were spent face-to-face with the patient as well as preparation, review, communication, and documentation during this encounter.   Barnett Applebaum, MD, [facility], [facility] 11/21/2019  9:34 AM

## 2020-01-31 ENCOUNTER — Ambulatory Visit: Payer: Medicare HMO | Admitting: Dermatology

## 2020-02-21 ENCOUNTER — Encounter: Payer: Self-pay | Admitting: Obstetrics & Gynecology

## 2020-02-21 ENCOUNTER — Ambulatory Visit: Payer: Medicare HMO | Admitting: Obstetrics & Gynecology

## 2020-02-21 ENCOUNTER — Other Ambulatory Visit: Payer: Self-pay

## 2020-02-21 VITALS — BP 120/80 | Ht 66.0 in | Wt 155.0 lb

## 2020-02-21 DIAGNOSIS — N812 Incomplete uterovaginal prolapse: Secondary | ICD-10-CM | POA: Diagnosis not present

## 2020-02-21 DIAGNOSIS — N8111 Cystocele, midline: Secondary | ICD-10-CM | POA: Diagnosis not present

## 2020-02-21 NOTE — Progress Notes (Signed)
HPI:      [name] is a 86 y.o. G2P0011 who presents today for her pessary follow up and examination related to her pelvic floor weakening.  Pt reports tolerating the pessary well with no vaginal bleeding and no vaginal discharge.  Symptoms of pelvic floor weakening have greatly improved. She is voiding and defecating without difficulty. She currently has a Ring 4 pessary.  PMHx: She  has a past medical history of Abnormal mammogram, unspecified, Actinic keratosis, Breast screening, unspecified, Diabetes ([place]), Hypertension, Mammographic microcalcification, Squamous cell carcinoma of skin (06/14/2019), and Vaginal pessary present. Also,  has a past surgical history that includes Skin lesion excision; Breast biopsy (Left, 2013); Breast excisional biopsy (Left); Esophagogastroduodenoscopy (Left, 06/12/2018); and Esophagogastroduodenoscopy (N/A, 08/31/2019)., family history includes Cancer in her mother.,  reports that she has never smoked. She has never used smokeless tobacco. She reports that she does not drink alcohol and does not use drugs.  She has a current medication list which includes the following prescription(s): allopurinol, aspirin ec, cyanocobalamin, ferrous sulfate, glipizide, irbesartan-hydrochlorothiazide, levothyroxine, metoprolol tartrate, pantoprazole, pioglitazone, simvastatin, tradjenta, and vitamin d (ergocalciferol). Also, is allergic to oxycodone hcl, oxycodone-aspirin, augmentin [amoxicillin-pot clavulanate], codeine, etodolac, iodine, levaquin [levofloxacin in d5w], levofloxacin, metronidazole, ultram [tramadol], and sulfa antibiotics.  Review of Systems  All other systems reviewed and are negative.   Objective: BP 120/80   Ht 5\' 6"  (1.676 m)   Wt 155 lb (70.3 kg)   BMI 25.02 kg/m  Physical Exam Constitutional:      General: She is not in acute distress.    Appearance: She is well-developed.  Genitourinary:     Pelvic exam was performed with patient supine.      Vagina and uterus normal.     No vaginal erythema or bleeding.     No cervical motion tenderness, discharge, polyp or nabothian cyst.     Uterus is mobile.     Uterus is not enlarged.     No uterine mass detected.    Uterus is midaxial.     No right or left adnexal mass present.     Right adnexa not tender.     Left adnexa not tender.     Genitourinary Comments: Prolapse and cystocele present   Atrophy mild  HENT:     Head: Normocephalic and atraumatic.     Nose: Nose normal.  Abdominal:     General: There is no distension.     Palpations: Abdomen is soft.     Tenderness: There is no abdominal tenderness.  Musculoskeletal:        General: Normal range of motion.  Neurological:     Mental Status: She is alert and oriented to person, place, and time.     Cranial Nerves: No cranial nerve deficit.  Skin:    General: Skin is warm and dry.  Psychiatric:        Attention and Perception: Attention normal.        Mood and Affect: Mood and affect normal.        Speech: Speech normal.        Behavior: Behavior normal.        Thought Content: Thought content normal.        Judgment: Judgment normal.     Pessary Care Pessary removed and cleaned.  Vagina checked - without erosions - pessary replaced.  A/P:1. Uterovaginal prolapse, incomplete 2. Cystocele, midline Pessary was cleaned and replaced today. Instructions given for care. Concerning symptoms to observe  for are counseled to patient. Follow up scheduled for 3 months.  A total of 20 minutes were spent face-to-face with the patient as well as preparation, review, communication, and documentation during this encounter.   Barnett Applebaum, MD, [facility], [facility] 02/21/2020  9:14 AM

## 2020-02-21 NOTE — Patient Instructions (Signed)
Thank you for choosing [facility]. As part of our ongoing efforts to improve patient experience, we would appreciate your feedback. Please fill out the short survey that you will receive by mail or MyChart. Your opinion is important to us! -Dr Arnika Larzelere  

## 2020-03-14 ENCOUNTER — Ambulatory Visit: Payer: Self-pay | Admitting: Dermatology

## 2020-05-22 ENCOUNTER — Other Ambulatory Visit: Payer: Self-pay

## 2020-05-22 ENCOUNTER — Encounter: Payer: Self-pay | Admitting: Obstetrics & Gynecology

## 2020-05-22 ENCOUNTER — Ambulatory Visit (INDEPENDENT_AMBULATORY_CARE_PROVIDER_SITE_OTHER): Payer: Medicare HMO | Admitting: Obstetrics & Gynecology

## 2020-05-22 VITALS — BP 130/80 | Ht 66.0 in | Wt 153.0 lb

## 2020-05-22 DIAGNOSIS — N8111 Cystocele, midline: Secondary | ICD-10-CM | POA: Diagnosis not present

## 2020-05-22 DIAGNOSIS — N812 Incomplete uterovaginal prolapse: Secondary | ICD-10-CM | POA: Diagnosis not present

## 2020-05-22 NOTE — Progress Notes (Signed)
HPI:      [name] is a 86 y.o. G2P0011 who presents today for her pessary follow up and examination related to her pelvic floor weakening.  Pt reports tolerating the pessary well with no vaginal bleeding and no vaginal discharge.  Symptoms of pelvic floor weakening have greatly improved. She is voiding and defecating without difficulty. She currently has a Ring4 pessary.  PMHx: She  has a past medical history of Abnormal mammogram, unspecified, Actinic keratosis, Breast screening, unspecified, Diabetes ([place]), Hypertension, Mammographic microcalcification, Squamous cell carcinoma of skin (06/14/2019), and Vaginal pessary present. Also,  has a past surgical history that includes Skin lesion excision; Breast biopsy (Left, 2013); Breast excisional biopsy (Left); Esophagogastroduodenoscopy (Left, 06/12/2018); and Esophagogastroduodenoscopy (N/A, 08/31/2019)., family history includes Cancer in her mother.,  reports that she has never smoked. She has never used smokeless tobacco. She reports that she does not drink alcohol and does not use drugs.  She has a current medication list which includes the following prescription(s): allopurinol, aspirin ec, cyanocobalamin, ferrous sulfate, glipizide, irbesartan-hydrochlorothiazide, levothyroxine, metoprolol tartrate, pantoprazole, pioglitazone, simvastatin, tradjenta, and vitamin d (ergocalciferol). Also, is allergic to oxycodone hcl, oxycodone-aspirin, augmentin [amoxicillin-pot clavulanate], codeine, etodolac, iodine, levaquin [levofloxacin in d5w], levofloxacin, metronidazole, ultram [tramadol], and sulfa antibiotics.  Review of Systems  All other systems reviewed and are negative.   Objective: BP 130/80   Ht 5\' 6"  (1.676 m)   Wt 153 lb (69.4 kg)   BMI 24.69 kg/m  Physical Exam Constitutional:      General: She is not in acute distress.    Appearance: She is well-developed.  Genitourinary:     Vagina and uterus normal.     No vaginal erythema  or bleeding.     Mild vaginal atrophy present.     Right Adnexa: not tender and no mass present.    Left Adnexa: not tender and no mass present.    No cervical motion tenderness, discharge, polyp or nabothian cyst.     Uterus is mobile.     Uterus is not enlarged.     No uterine mass detected.    Uterus exam comments: Prolapse and cystocele present  .     Uterus is midaxial.     Pelvic exam was performed with patient supine.  HENT:     Head: Normocephalic and atraumatic.     Nose: Nose normal.  Abdominal:     General: There is no distension.     Palpations: Abdomen is soft.     Tenderness: There is no abdominal tenderness.  Musculoskeletal:        General: Normal range of motion.  Neurological:     Mental Status: She is alert and oriented to person, place, and time.     Cranial Nerves: No cranial nerve deficit.  Skin:    General: Skin is warm and dry.  Psychiatric:        Attention and Perception: Attention normal.        Mood and Affect: Mood and affect normal.        Speech: Speech normal.        Behavior: Behavior normal.        Thought Content: Thought content normal.        Judgment: Judgment normal.     Pessary Care Pessary removed and cleaned.  Vagina checked - without erosions - pessary replaced.  A/P:1. Uterovaginal prolapse, incomplete 2. Cystocele, midline Pessary was cleaned and replaced today. Instructions given for care. Concerning symptoms to  observe for are counseled to patient. Follow up scheduled for 3 months.  A total of 20 minutes were spent face-to-face with the patient as well as preparation, review, communication, and documentation during this encounter.   Barnett Applebaum, MD, [facility], [facility] 05/22/2020  11:16 AM

## 2020-06-24 ENCOUNTER — Ambulatory Visit: Payer: Medicare HMO | Admitting: Dermatology

## 2020-07-15 ENCOUNTER — Ambulatory Visit: Payer: Medicare HMO | Admitting: Dermatology

## 2020-07-15 ENCOUNTER — Other Ambulatory Visit: Payer: Self-pay

## 2020-07-15 DIAGNOSIS — L570 Actinic keratosis: Secondary | ICD-10-CM

## 2020-07-15 DIAGNOSIS — L578 Other skin changes due to chronic exposure to nonionizing radiation: Secondary | ICD-10-CM

## 2020-07-15 DIAGNOSIS — Z86007 Personal history of in-situ neoplasm of skin: Secondary | ICD-10-CM | POA: Diagnosis not present

## 2020-07-15 DIAGNOSIS — L821 Other seborrheic keratosis: Secondary | ICD-10-CM | POA: Diagnosis not present

## 2020-07-15 NOTE — Progress Notes (Signed)
   Follow-Up Visit   Subjective  Erika Ochoa is a 87 y.o. female who presents for the following: Follow-up (3 months f/u  left of midline medial forehead at hairline/SQUAMOUS CELL CARCINOMA In situ treated with EDC ).  Granddaughter with pt and contributes to history.  The following portions of the chart were reviewed this encounter and updated as appropriate:   Tobacco  Allergies  Meds  Problems  Med Hx  Surg Hx  Fam Hx     Review of Systems:  No other skin or systemic complaints except as noted in HPI or Assessment and Plan.  Objective  Well appearing patient in no apparent distress; mood and affect are within normal limits.  A focused examination was performed including face, neck . Relevant physical exam findings are noted in the Assessment and Plan.  Objective  left of midline medial forehead at hairline: Well healed scar with no evidence of recurrence.   Objective  face x 12 (12): Erythematous thin papules/macules with gritty scale.    Assessment & Plan  History of squamous cell carcinoma in situ (SCCIS) of skin left of midline medial forehead at hairline  Clear. Observe for recurrence. Call clinic for new or changing lesions.  Recommend regular skin exams, daily broad-spectrum spf 30+ sunscreen use, and photoprotection.     AK (actinic keratosis) (12) face x 12  Destruction of lesion - face x 12 Complexity: simple   Destruction method: cryotherapy   Informed consent: discussed and consent obtained   Timeout:  patient name, date of birth, surgical site, and procedure verified Lesion destroyed using liquid nitrogen: Yes   Region frozen until ice ball extended beyond lesion: Yes   Outcome: patient tolerated procedure well with no complications   Post-procedure details: wound care instructions given    Seborrheic Keratoses - Stuck-on, waxy, tan-brown papules and plaques  - Discussed benign etiology and prognosis. - Observe - Call for any  changes  Actinic Damage - chronic, secondary to cumulative UV radiation exposure/sun exposure over time - diffuse scaly erythematous macules with underlying dyspigmentation - Recommend daily broad spectrum sunscreen SPF 30+ to sun-exposed areas, reapply every 2 hours as needed.  - Call for new or changing lesions.  Return in about 1 year (around 07/15/2021) for hx of SCC .  IMarye Round, CMA, am acting as scribe for Sarina Ser, MD .  Documentation: I have reviewed the above documentation for accuracy and completeness, and I agree with the above.  Sarina Ser, MD

## 2020-07-15 NOTE — Patient Instructions (Signed)
Cryotherapy Aftercare  . Wash gently with soap and water everyday.   . Apply Vaseline and Band-Aid daily until healed. Recommend daily broad spectrum sunscreen SPF 30+ to sun-exposed areas, reapply every 2 hours as needed. Call for new or changing lesions.  

## 2020-07-18 ENCOUNTER — Encounter: Payer: Self-pay | Admitting: Dermatology

## 2020-08-12 ENCOUNTER — Ambulatory Visit: Payer: Medicare HMO | Admitting: Dermatology

## 2020-08-22 ENCOUNTER — Ambulatory Visit: Payer: Medicare HMO | Admitting: Obstetrics & Gynecology

## 2020-08-23 ENCOUNTER — Encounter: Payer: Self-pay | Admitting: Obstetrics & Gynecology

## 2020-08-23 ENCOUNTER — Other Ambulatory Visit: Payer: Self-pay

## 2020-08-23 ENCOUNTER — Ambulatory Visit: Payer: Medicare HMO | Admitting: Obstetrics & Gynecology

## 2020-08-23 VITALS — BP 120/70 | Ht 66.0 in | Wt 149.0 lb

## 2020-08-23 DIAGNOSIS — N812 Incomplete uterovaginal prolapse: Secondary | ICD-10-CM

## 2020-08-23 DIAGNOSIS — N8111 Cystocele, midline: Secondary | ICD-10-CM

## 2020-08-23 NOTE — Progress Notes (Signed)
HPI:      [name] is a 87 y.o. G2P0011 who presents today for her pessary follow up and examination related to her pelvic floor weakening.  Pt reports tolerating the pessary well with no vaginal bleeding and no vaginal discharge.  Symptoms of pelvic floor weakening have greatly improved. She is voiding and defecating without difficulty. She currently has a Ring 4 pessary.  PMHx: She  has a past medical history of Abnormal mammogram, unspecified, Actinic keratosis, Breast screening, unspecified, Diabetes ([place]), Hypertension, Mammographic microcalcification, Squamous cell carcinoma of skin (06/14/2019), Squamous cell carcinoma of skin (09/13/2019), and Vaginal pessary present. Also,  has a past surgical history that includes Skin lesion excision; Breast biopsy (Left, 2013); Breast excisional biopsy (Left); Esophagogastroduodenoscopy (Left, 06/12/2018); and Esophagogastroduodenoscopy (N/A, 08/31/2019)., family history includes Cancer in her mother.,  reports that she has never smoked. She has never used smokeless tobacco. She reports that she does not drink alcohol and does not use drugs.  She has a current medication list which includes the following prescription(s): allopurinol, aspirin ec, cyanocobalamin, ferrous sulfate, glipizide, irbesartan-hydrochlorothiazide, levothyroxine, metoprolol tartrate, pantoprazole, pioglitazone, simvastatin, tradjenta, and vitamin d (ergocalciferol). Also, is allergic to oxycodone hcl, oxycodone-aspirin, augmentin [amoxicillin-pot clavulanate], codeine, etodolac, iodine, levaquin [levofloxacin in d5w], levofloxacin, metronidazole, ultram [tramadol], and sulfa antibiotics.  Review of Systems  All other systems reviewed and are negative.   Objective: BP 120/70   Ht 5\' 6"  (1.676 m)   Wt 149 lb (67.6 kg)   BMI 24.05 kg/m  Physical Exam Constitutional:      General: She is not in acute distress.    Appearance: She is well-developed.  Genitourinary:     Right  Labia: No rash or tenderness.    Left Labia: No tenderness or rash.    No vaginal erythema or bleeding.     Moderate vaginal atrophy present.     Right Adnexa: not tender and no mass present.    Left Adnexa: not tender and no mass present.    No cervical motion tenderness, discharge, polyp or nabothian cyst.     Pelvic exam was performed with patient in the lithotomy position.  HENT:     Head: Normocephalic and atraumatic.     Nose: Nose normal.  Abdominal:     General: There is no distension.     Palpations: Abdomen is soft.     Tenderness: There is no abdominal tenderness.  Musculoskeletal:        General: Normal range of motion.  Neurological:     Mental Status: She is alert and oriented to person, place, and time.     Cranial Nerves: No cranial nerve deficit.  Skin:    General: Skin is warm and dry.  Psychiatric:        Attention and Perception: Attention normal.        Mood and Affect: Mood and affect normal.        Speech: Speech normal.        Behavior: Behavior normal.        Thought Content: Thought content normal.        Judgment: Judgment normal.    Pessary Care Pessary removed and cleaned.  Vagina checked - without erosions - pessary replaced.  A/P:1. Uterovaginal prolapse, incomplete 2. Cystocele, midline Pessary was cleaned and replaced today. Instructions given for care. Concerning symptoms to observe for are counseled to patient. Follow up scheduled for 3 months.  A total of 20 minutes were spent face-to-face with the  patient as well as preparation, review, communication, and documentation during this encounter.   Barnett Applebaum, MD, [facility], [facility] 08/23/2020  10:48 AM

## 2020-09-08 ENCOUNTER — Emergency Department: Payer: Medicare HMO

## 2020-09-08 ENCOUNTER — Other Ambulatory Visit: Payer: Self-pay

## 2020-09-08 ENCOUNTER — Inpatient Hospital Stay
Admission: EM | Admit: 2020-09-08 | Discharge: 2020-09-10 | DRG: 281 | Disposition: A | Discharge status: Home / Self Care | Payer: Medicare HMO | Attending: Internal Medicine | Admitting: Internal Medicine

## 2020-09-08 DIAGNOSIS — I480 Paroxysmal atrial fibrillation: Secondary | ICD-10-CM | POA: Diagnosis present

## 2020-09-08 DIAGNOSIS — N184 Chronic kidney disease, stage 4 (severe): Secondary | ICD-10-CM | POA: Diagnosis present

## 2020-09-08 DIAGNOSIS — N179 Acute kidney failure, unspecified: Secondary | ICD-10-CM

## 2020-09-08 DIAGNOSIS — E1169 Type 2 diabetes mellitus with other specified complication: Secondary | ICD-10-CM | POA: Diagnosis present

## 2020-09-08 DIAGNOSIS — I214 Non-ST elevation (NSTEMI) myocardial infarction: Principal | ICD-10-CM | POA: Diagnosis present

## 2020-09-08 DIAGNOSIS — M109 Gout, unspecified: Secondary | ICD-10-CM | POA: Diagnosis present

## 2020-09-08 DIAGNOSIS — Z20822 Contact with and (suspected) exposure to covid-19: Secondary | ICD-10-CM | POA: Diagnosis present

## 2020-09-08 DIAGNOSIS — E039 Hypothyroidism, unspecified: Secondary | ICD-10-CM | POA: Diagnosis present

## 2020-09-08 DIAGNOSIS — E1122 Type 2 diabetes mellitus with diabetic chronic kidney disease: Secondary | ICD-10-CM | POA: Diagnosis present

## 2020-09-08 DIAGNOSIS — I4510 Unspecified right bundle-branch block: Secondary | ICD-10-CM | POA: Diagnosis present

## 2020-09-08 DIAGNOSIS — I129 Hypertensive chronic kidney disease with stage 1 through stage 4 chronic kidney disease, or unspecified chronic kidney disease: Secondary | ICD-10-CM | POA: Diagnosis present

## 2020-09-08 DIAGNOSIS — Z79890 Hormone replacement therapy (postmenopausal): Secondary | ICD-10-CM

## 2020-09-08 DIAGNOSIS — Z9181 History of falling: Secondary | ICD-10-CM

## 2020-09-08 DIAGNOSIS — Z7984 Long term (current) use of oral hypoglycemic drugs: Secondary | ICD-10-CM | POA: Diagnosis not present

## 2020-09-08 DIAGNOSIS — I471 Supraventricular tachycardia, unspecified: Secondary | ICD-10-CM

## 2020-09-08 DIAGNOSIS — Z79899 Other long term (current) drug therapy: Secondary | ICD-10-CM

## 2020-09-08 DIAGNOSIS — Z888 Allergy status to other drugs, medicaments and biological substances status: Secondary | ICD-10-CM | POA: Diagnosis not present

## 2020-09-08 DIAGNOSIS — I10 Essential (primary) hypertension: Secondary | ICD-10-CM | POA: Diagnosis present

## 2020-09-08 DIAGNOSIS — E871 Hypo-osmolality and hyponatremia: Secondary | ICD-10-CM | POA: Diagnosis present

## 2020-09-08 DIAGNOSIS — Z85828 Personal history of other malignant neoplasm of skin: Secondary | ICD-10-CM | POA: Diagnosis not present

## 2020-09-08 DIAGNOSIS — Z882 Allergy status to sulfonamides status: Secondary | ICD-10-CM

## 2020-09-08 DIAGNOSIS — Z66 Do not resuscitate: Secondary | ICD-10-CM | POA: Diagnosis present

## 2020-09-08 DIAGNOSIS — R0789 Other chest pain: Secondary | ICD-10-CM | POA: Diagnosis present

## 2020-09-08 DIAGNOSIS — E1165 Type 2 diabetes mellitus with hyperglycemia: Secondary | ICD-10-CM | POA: Diagnosis present

## 2020-09-08 DIAGNOSIS — Z885 Allergy status to narcotic agent status: Secondary | ICD-10-CM

## 2020-09-08 DIAGNOSIS — Z8673 Personal history of transient ischemic attack (TIA), and cerebral infarction without residual deficits: Secondary | ICD-10-CM | POA: Diagnosis not present

## 2020-09-08 DIAGNOSIS — N189 Chronic kidney disease, unspecified: Secondary | ICD-10-CM | POA: Diagnosis not present

## 2020-09-08 DIAGNOSIS — I4891 Unspecified atrial fibrillation: Secondary | ICD-10-CM | POA: Diagnosis present

## 2020-09-08 DIAGNOSIS — E785 Hyperlipidemia, unspecified: Secondary | ICD-10-CM | POA: Diagnosis present

## 2020-09-08 DIAGNOSIS — R739 Hyperglycemia, unspecified: Secondary | ICD-10-CM

## 2020-09-08 DIAGNOSIS — R0602 Shortness of breath: Secondary | ICD-10-CM

## 2020-09-08 LAB — URINALYSIS, COMPLETE (UACMP) WITH MICROSCOPIC
Bacteria, UA: NONE SEEN
Bilirubin Urine: NEGATIVE
Glucose, UA: >=500 mg/dL — AB
Ketones, ur: NEGATIVE mg/dL
Leukocytes,Ua: NEGATIVE
Nitrite: NEGATIVE
Protein, ur: NEGATIVE mg/dL
Specific Gravity, Urine: 1.02 (ref 1.005–1.030)
pH: 5 (ref 5.0–8.0)

## 2020-09-08 LAB — COMPREHENSIVE METABOLIC PANEL
ALT: 18 U/L (ref 0–44)
AST: 28 U/L (ref 15–41)
Albumin: 3.4 g/dL — ABNORMAL LOW (ref 3.5–5.0)
Alkaline Phosphatase: 86 U/L (ref 38–126)
Anion gap: 9 (ref 5–15)
BUN: 29 mg/dL — ABNORMAL HIGH (ref 8–23)
CO2: 25 mmol/L (ref 22–32)
Calcium: 8.6 mg/dL — ABNORMAL LOW (ref 8.9–10.3)
Chloride: 97 mmol/L — ABNORMAL LOW (ref 98–111)
Creatinine, Ser: 1.71 mg/dL — ABNORMAL HIGH (ref 0.44–1.00)
GFR, Estimated: 27 mL/min — ABNORMAL LOW (ref >60)
Glucose, Bld: 569 mg/dL (ref 70–99)
Potassium: 4.2 mmol/L (ref 3.5–5.1)
Sodium: 131 mmol/L — ABNORMAL LOW (ref 135–145)
Total Bilirubin: 1.3 mg/dL — ABNORMAL HIGH (ref 0.3–1.2)
Total Protein: 6.7 g/dL (ref 6.5–8.1)

## 2020-09-08 LAB — HEPARIN LEVEL (UNFRACTIONATED): Heparin Unfractionated: 0.28 IU/mL — ABNORMAL LOW (ref 0.30–0.70)

## 2020-09-08 LAB — PROTIME-INR
INR: 1 (ref 0.8–1.2)
Prothrombin Time: 12.9 seconds (ref 11.4–15.2)

## 2020-09-08 LAB — CBC WITH DIFFERENTIAL/PLATELET
Abs Immature Granulocytes: 0.08 10*3/uL — ABNORMAL HIGH (ref 0.00–0.07)
Basophils Absolute: 0.1 10*3/uL (ref 0.0–0.1)
Basophils Relative: 1 %
Eosinophils Absolute: 0 10*3/uL (ref 0.0–0.5)
Eosinophils Relative: 0 %
HCT: 36.5 % (ref 36.0–46.0)
Hemoglobin: 12.8 g/dL (ref 12.0–15.0)
Immature Granulocytes: 1 %
Lymphocytes Relative: 11 %
Lymphs Abs: 1.3 10*3/uL (ref 0.7–4.0)
MCH: 33.7 pg (ref 26.0–34.0)
MCHC: 35.1 g/dL (ref 30.0–36.0)
MCV: 96.1 fL (ref 80.0–100.0)
Monocytes Absolute: 0.9 10*3/uL (ref 0.1–1.0)
Monocytes Relative: 8 %
Neutro Abs: 9.6 10*3/uL — ABNORMAL HIGH (ref 1.7–7.7)
Neutrophils Relative %: 79 %
Platelets: 223 10*3/uL (ref 150–400)
RBC: 3.8 MIL/uL — ABNORMAL LOW (ref 3.87–5.11)
RDW: 13.9 % (ref 11.5–15.5)
WBC: 11.9 10*3/uL — ABNORMAL HIGH (ref 4.0–10.5)
nRBC: 0 % (ref 0.0–0.2)

## 2020-09-08 LAB — GLUCOSE, CAPILLARY
Glucose-Capillary: 416 mg/dL — ABNORMAL HIGH (ref 70–99)
Glucose-Capillary: 171 mg/dL — ABNORMAL HIGH (ref 70–99)
Glucose-Capillary: 175 mg/dL — ABNORMAL HIGH (ref 70–99)

## 2020-09-08 LAB — RESP PANEL BY RT-PCR (FLU A&B, COVID) ARPGX2
Influenza A by PCR: NEGATIVE
Influenza B by PCR: NEGATIVE
SARS Coronavirus 2 by RT PCR: NEGATIVE

## 2020-09-08 LAB — TROPONIN I (HIGH SENSITIVITY)
Troponin I (High Sensitivity): 2984 ng/L (ref <18)
Troponin I (High Sensitivity): 3629 ng/L (ref <18)
Troponin I (High Sensitivity): 3629 ng/L (ref <18)
Troponin I (High Sensitivity): 3175 ng/L (ref <18)

## 2020-09-08 LAB — APTT: aPTT: 35 seconds (ref 24–36)

## 2020-09-08 LAB — BETA-HYDROXYBUTYRIC ACID: Beta-Hydroxybutyric Acid: 0.13 mmol/L (ref 0.05–0.27)

## 2020-09-08 LAB — MAGNESIUM: Magnesium: 1.5 mg/dL — ABNORMAL LOW (ref 1.7–2.4)

## 2020-09-08 LAB — CBG MONITORING, ED: Glucose-Capillary: 555 mg/dL (ref 70–99)

## 2020-09-08 MED ORDER — DONEPEZIL HCL 5 MG PO TABS
5.0000 mg | ORAL_TABLET | Freq: Every evening | ORAL | Status: DC
  Administered 2020-09-08 – 2020-09-09 (×2): 5 mg via ORAL
  Filled 2020-09-08 (×4): qty 1

## 2020-09-08 MED ORDER — SODIUM CHLORIDE 0.9 % IV SOLN: INTRAVENOUS | Status: DC

## 2020-09-08 MED ORDER — NITROGLYCERIN 0.4 MG SL SUBL
0.4000 mg | SUBLINGUAL_TABLET | SUBLINGUAL | Status: DC | PRN
  Administered 2020-09-08 (×2): 0.4 mg via SUBLINGUAL
  Filled 2020-09-08 (×3): qty 1

## 2020-09-08 MED ORDER — HEPARIN BOLUS VIA INFUSION
3800.0000 [IU] | Freq: Once | INTRAVENOUS | Status: DC
  Filled 2020-09-08: qty 3800

## 2020-09-08 MED ORDER — SIMVASTATIN 20 MG PO TABS
20.0000 mg | ORAL_TABLET | Freq: Every evening | ORAL | Status: DC
  Administered 2020-09-08 – 2020-09-09 (×2): 20 mg via ORAL
  Filled 2020-09-08 (×2): qty 1

## 2020-09-08 MED ORDER — VITAMIN D (ERGOCALCIFEROL) 1.25 MG (50000 UNIT) PO CAPS: 50000.0000 [IU] | ORAL_CAPSULE | ORAL | Status: DC

## 2020-09-08 MED ORDER — IRBESARTAN-HYDROCHLOROTHIAZIDE 300-12.5 MG PO TABS: 1.0000 | ORAL_TABLET | Freq: Every day | ORAL | Status: DC

## 2020-09-08 MED ORDER — HEPARIN (PORCINE) 25000 UT/250ML-% IV SOLN
950.0000 [IU]/h | INTRAVENOUS | Status: DC
  Administered 2020-09-08: 750 [IU]/h via INTRAVENOUS
  Administered 2020-09-09: 950 [IU]/h via INTRAVENOUS
  Filled 2020-09-08 (×2): qty 250

## 2020-09-08 MED ORDER — ISOSORBIDE MONONITRATE ER 30 MG PO TB24
30.0000 mg | ORAL_TABLET | Freq: Every day | ORAL | Status: DC
  Administered 2020-09-08 – 2020-09-10 (×3): 30 mg via ORAL
  Filled 2020-09-08 (×3): qty 1

## 2020-09-08 MED ORDER — ASPIRIN 81 MG PO CHEW: 324.0000 mg | CHEWABLE_TABLET | ORAL | Status: DC

## 2020-09-08 MED ORDER — CLOPIDOGREL BISULFATE 75 MG PO TABS
75.0000 mg | ORAL_TABLET | Freq: Every day | ORAL | Status: DC
  Administered 2020-09-08 – 2020-09-10 (×3): 75 mg via ORAL
  Filled 2020-09-08 (×3): qty 1

## 2020-09-08 MED ORDER — ASPIRIN 300 MG RE SUPP: 300.0000 mg | RECTAL | Status: DC

## 2020-09-08 MED ORDER — ASPIRIN EC 81 MG PO TBEC
81.0000 mg | DELAYED_RELEASE_TABLET | Freq: Every day | ORAL | Status: DC
  Administered 2020-09-09 – 2020-09-10 (×2): 81 mg via ORAL
  Filled 2020-09-08 (×2): qty 1

## 2020-09-08 MED ORDER — HEPARIN BOLUS VIA INFUSION
950.0000 [IU] | Freq: Once | INTRAVENOUS | Status: AC
  Administered 2020-09-08: 950 [IU] via INTRAVENOUS
  Filled 2020-09-08: qty 950

## 2020-09-08 MED ORDER — ONDANSETRON HCL 4 MG/2ML IJ SOLN: 4.0000 mg | Freq: Four times a day (QID) | INTRAMUSCULAR | Status: DC | PRN

## 2020-09-08 MED ORDER — LACTATED RINGERS IV BOLUS
1000.0000 mL | Freq: Once | INTRAVENOUS | Status: AC
  Administered 2020-09-08: 1000 mL via INTRAVENOUS

## 2020-09-08 MED ORDER — IRBESARTAN 150 MG PO TABS
300.0000 mg | ORAL_TABLET | Freq: Every day | ORAL | Status: DC
  Administered 2020-09-09: 300 mg via ORAL
  Filled 2020-09-08: qty 2

## 2020-09-08 MED ORDER — HYDROCHLOROTHIAZIDE 12.5 MG PO CAPS
12.5000 mg | ORAL_CAPSULE | Freq: Every day | ORAL | Status: DC
  Administered 2020-09-09: 12.5 mg via ORAL
  Filled 2020-09-08: qty 1

## 2020-09-08 MED ORDER — ASPIRIN 81 MG PO CHEW: 324.0000 mg | CHEWABLE_TABLET | Freq: Once | ORAL | Status: DC

## 2020-09-08 MED ORDER — PANTOPRAZOLE SODIUM 40 MG PO TBEC
40.0000 mg | DELAYED_RELEASE_TABLET | Freq: Every day | ORAL | Status: DC
  Administered 2020-09-09 – 2020-09-10 (×2): 40 mg via ORAL
  Filled 2020-09-08 (×2): qty 1

## 2020-09-08 MED ORDER — INSULIN ASPART 100 UNIT/ML ~~LOC~~ SOLN
0.0000 [IU] | Freq: Three times a day (TID) | SUBCUTANEOUS | Status: DC
  Administered 2020-09-08 – 2020-09-09 (×2): 15 [IU] via SUBCUTANEOUS
  Administered 2020-09-09: 3 [IU] via SUBCUTANEOUS
  Administered 2020-09-09: 8 [IU] via SUBCUTANEOUS
  Administered 2020-09-10: 5 [IU] via SUBCUTANEOUS
  Administered 2020-09-10: 3 [IU] via SUBCUTANEOUS
  Filled 2020-09-08 (×6): qty 1

## 2020-09-08 MED ORDER — ALLOPURINOL 100 MG PO TABS: 100.0000 mg | ORAL_TABLET | Freq: Every day | ORAL | Status: DC

## 2020-09-08 MED ORDER — NITROGLYCERIN 0.4 MG SL SUBL
0.4000 mg | SUBLINGUAL_TABLET | SUBLINGUAL | Status: DC | PRN
  Filled 2020-09-08: qty 1

## 2020-09-08 MED ORDER — ACETAMINOPHEN 325 MG PO TABS
650.0000 mg | ORAL_TABLET | ORAL | Status: DC | PRN
  Filled 2020-09-08: qty 2

## 2020-09-08 MED ORDER — HEPARIN SODIUM (PORCINE) 5000 UNIT/ML IJ SOLN
60.0000 [IU]/kg | Freq: Once | INTRAMUSCULAR | Status: AC
  Administered 2020-09-08: 3800 [IU] via INTRAVENOUS
  Filled 2020-09-08: qty 1

## 2020-09-08 MED ORDER — MAGNESIUM SULFATE 2 GM/50ML IV SOLN
2.0000 g | Freq: Once | INTRAVENOUS | Status: AC
  Administered 2020-09-08: 2 g via INTRAVENOUS
  Filled 2020-09-08: qty 50

## 2020-09-08 MED ORDER — ALLOPURINOL 100 MG PO TABS
100.0000 mg | ORAL_TABLET | Freq: Every day | ORAL | Status: DC
  Administered 2020-09-09 – 2020-09-10 (×2): 100 mg via ORAL
  Filled 2020-09-08 (×2): qty 1

## 2020-09-08 MED ORDER — LEVOTHYROXINE SODIUM 50 MCG PO TABS
50.0000 ug | ORAL_TABLET | Freq: Every day | ORAL | Status: DC
  Administered 2020-09-09 – 2020-09-10 (×2): 50 ug via ORAL
  Filled 2020-09-08 (×2): qty 1

## 2020-09-08 MED ORDER — METOPROLOL TARTRATE 25 MG PO TABS
25.0000 mg | ORAL_TABLET | Freq: Two times a day (BID) | ORAL | Status: DC
  Administered 2020-09-09: 25 mg via ORAL
  Filled 2020-09-08: qty 1

## 2020-09-08 NOTE — ED Provider Notes (Signed)
[facility] Emergency Department Provider Note ____________________________________________   Event Date/Time   First MD Initiated Contact with Patient 09/08/20 [id number]     (approximate)  I have reviewed the triage vital signs and the nursing notes.  HISTORY  Chief Complaint Chest Pain   HPI Erika Ochoa is a 87 y.o. femalewho presents to the ED for evaluation of chest pain.   Chart review indicates hx HTN, DM on multiple oral agents, hypothyroidism.  Previous paroxysmal A. fib for which she follows with cardiology, and not on anticoagulation due to GI bleed risk.  Metoprolol 50.  Patient presents to the ED from home via EMS for evaluation of chest pain.  Her eldest son arrives later and provides some additional history.  Patient reports chest pain that started last night and has been present since that time.  She reports the pain radiating towards her back and with associated nausea.  Patient lives at home alone and is independently ambulatory.  Past Medical History:  Diagnosis Date  . Abnormal mammogram, unspecified   . Actinic keratosis   . Breast screening, unspecified   . Diabetes ([place])   . Hypertension   . Mammographic microcalcification   . Squamous cell carcinoma of skin 06/14/2019   Left superior forehead. Well differentiated. Tx: EDC  . Squamous cell carcinoma of skin 09/13/2019   L of midline med forehead at hairline SCCIS   . Vaginal pessary present     Patient Active Problem List   Diagnosis Date Noted  . Symptomatic anemia 08/30/2019  . Gout 08/30/2019  . Elevated troponin 08/30/2019  . GIB (gastrointestinal bleeding) 06/10/2018  . Cellulitis of leg, left 03/07/2018  . Bilateral edema of lower extremity 03/07/2018  . Bilateral lower extremity pain 03/07/2018  . Cystocele, midline 08/12/2016  . Uterovaginal prolapse, incomplete 08/12/2016  . History of transient ischemic attack (TIA) 02/05/2016  . TIA (transient ischemic attack)  12/19/2015  . Acute renal failure superimposed on stage 3a chronic kidney disease ([place]) 12/19/2015  . Primary osteoarthritis of both first carpometacarpal joints 07/24/2015  . B12 deficiency 02/25/2015  . A-fib ([place]) 07/11/2014  . Cervical pain 07/11/2014  . CAD (coronary artery disease) 07/11/2014  . Benign essential HTN 07/11/2014  . HLD (hyperlipidemia) 07/11/2014  . Type 2 diabetes mellitus with diabetic polyneuropathy, without long-term current use of insulin ([place]) 07/11/2014  . Essential (primary) hypertension 07/11/2014  . Adult hypothyroidism 07/11/2014    Past Surgical History:  Procedure Laterality Date  . BREAST BIOPSY Left 2013   stereo calcs  . BREAST EXCISIONAL BIOPSY Left    "years ago"-benign  . ESOPHAGOGASTRODUODENOSCOPY Left 06/12/2018   Procedure: ESOPHAGOGASTRODUODENOSCOPY (EGD);  Surgeon: Virgel Manifold, MD;  Location: [facility] ENDOSCOPY;  Service: Endoscopy;  Laterality: Left;  . ESOPHAGOGASTRODUODENOSCOPY N/A 08/31/2019   Procedure: ESOPHAGOGASTRODUODENOSCOPY (EGD);  Surgeon: Toledo, Benay Pike, MD;  Location: [facility] ENDOSCOPY;  Service: Gastroenterology;  Laterality: N/A;  . SKIN LESION EXCISION      Prior to Admission medications   Medication Sig Start Date End Date Taking? Authorizing Provider  allopurinol (ZYLOPRIM) 100 MG tablet Take 100 mg by mouth daily. 08/23/19  Yes [provider]  donepezil (ARICEPT) 5 MG tablet Take 1 tablet by mouth every evening. 07/20/20  Yes [provider]  ferrous sulfate 325 (65 FE) MG EC tablet Take 1 tablet (325 mg total) by mouth 2 (two) times daily. 08/31/19 08/30/20 Yes Lorella Nimrod, MD  glipiZIDE (GLUCOTROL XL) 5 MG 24 hr tablet Take 5 mg  by mouth 2 (two) times daily.  07/01/18  Yes [provider]  irbesartan-hydrochlorothiazide (AVALIDE) 300-12.5 MG tablet Take 1 tablet by mouth daily.    Yes [provider]  levothyroxine (SYNTHROID, LEVOTHROID) 50 MCG tablet Take 50 mcg by mouth daily.     Yes [provider]  metoprolol tartrate (LOPRESSOR) 25 MG tablet Take 25 mg by mouth 2 (two) times daily.   Yes [provider]  pantoprazole (PROTONIX) 40 MG tablet Take 1 tablet (40 mg total) by mouth daily. 08/31/19 08/30/20 Yes Lorella Nimrod, MD  pioglitazone (ACTOS) 30 MG tablet Take 30 mg by mouth daily. 07/12/19  Yes [provider]  simvastatin (ZOCOR) 20 MG tablet Take 20 mg by mouth every evening.    Yes [provider]  TRADJENTA 5 MG TABS tablet Take 5 mg by mouth daily.  07/01/18  Yes [provider]  Vitamin D, Ergocalciferol, (DRISDOL) 1.25 MG (50000 UT) CAPS capsule Take 50,000 Units by mouth every Saturday.  08/04/18  Yes [provider]  cyanocobalamin (,VITAMIN B-12,) 1000 MCG/ML injection Inject 1,000 mcg into the muscle every 30 (thirty) days.  10/25/15   [provider]    Allergies Oxycodone hcl, Oxycodone-aspirin, Augmentin [amoxicillin-pot clavulanate], Codeine, Etodolac, Iodine, Levaquin [levofloxacin in d5w], Levofloxacin, Metronidazole, Ultram [tramadol], and Sulfa antibiotics  Family History  Problem Relation Age of Onset  . Cancer Mother   . Breast cancer Neg Hx     Social History Social History   Tobacco Use  . Smoking status: Never Smoker  . Smokeless tobacco: Never Used  Vaping Use  . Vaping Use: Never used  Substance Use Topics  . Alcohol use: No    Alcohol/week: 0.0 standard drinks  . Drug use: No    Review of Systems  Constitutional: No fever/chills Eyes: No visual changes. ENT: No sore throat. Cardiovascular: Positive for chest pain. Respiratory: Denies shortness of breath. Gastrointestinal: No abdominal pain.   no vomiting.  No diarrhea.  No constipation.  Positive for nausea Genitourinary: Negative for dysuria. Musculoskeletal: Negative for back pain. Skin: Negative for rash. Neurological: Negative for headaches, focal weakness or  numbness. ____________________________________________   PHYSICAL EXAM:  VITAL SIGNS: Vitals:   09/08/20 1115 09/08/20 1130  BP: (!) 179/68 (!) 171/66  Pulse: (!) 58 (!) 56  Resp: 15 (!) 21  Temp:    SpO2: 98% 100%    Constitutional: Alert and oriented. Well appearing and in no acute distress.  Hard of hearing.  Quite pleasant. Eyes: Conjunctivae are normal. PERRL. EOMI. Head: Atraumatic. Nose: No congestion/rhinnorhea. Mouth/Throat: Mucous membranes are moist.  Oropharynx non-erythematous. Neck: No stridor. No cervical spine tenderness to palpation. Cardiovascular: Normal rate, regular rhythm. Grossly normal heart sounds.  Good peripheral circulation. Respiratory: Normal respiratory effort.  No retractions. Lungs CTAB. Gastrointestinal: Soft , nondistended, nontender to palpation. No CVA tenderness. Musculoskeletal: No lower extremity tenderness nor edema.  No joint effusions. No signs of acute trauma. Neurologic:  Normal speech and language. No gross focal neurologic deficits are appreciated. Skin:  Skin is warm, dry and intact. No rash noted. Psychiatric: Mood and affect are normal. Speech and behavior are normal. ____________________________________________   LABS (all labs ordered are listed, but only abnormal results are displayed)  Labs Reviewed  URINALYSIS, COMPLETE (UACMP) WITH MICROSCOPIC - Abnormal; Notable for the following components:      Result Value   Color, Urine STRAW (*)    APPearance CLEAR (*)    Glucose, UA >=500 (*)  Hgb urine dipstick SMALL (*)    All other components within normal limits  COMPREHENSIVE METABOLIC PANEL - Abnormal; Notable for the following components:   Sodium 131 (*)    Chloride 97 (*)    Glucose, Bld 569 (*)    BUN 29 (*)    Creatinine, Ser 1.71 (*)    Calcium 8.6 (*)    Albumin 3.4 (*)    Total Bilirubin 1.3 (*)    GFR, Estimated 27 (*)    All other components within normal limits  CBC WITH DIFFERENTIAL/PLATELET -  Abnormal; Notable for the following components:   WBC 11.9 (*)    RBC 3.80 (*)    Neutro Abs 9.6 (*)    Abs Immature Granulocytes 0.08 (*)    All other components within normal limits  MAGNESIUM - Abnormal; Notable for the following components:   Magnesium 1.5 (*)    All other components within normal limits  CBG MONITORING, ED - Abnormal; Notable for the following components:   Glucose-Capillary 555 (*)    All other components within normal limits  TROPONIN I (HIGH SENSITIVITY) - Abnormal; Notable for the following components:   Troponin I (High Sensitivity) 2,984 (*)    All other components within normal limits  RESP PANEL BY RT-PCR (FLU A&B, COVID) ARPGX2  BETA-HYDROXYBUTYRIC ACID  APTT  PROTIME-INR  HEPARIN LEVEL (UNFRACTIONATED)  TROPONIN I (HIGH SENSITIVITY)   ____________________________________________  12 Lead EKG  Sinus rhythm, rate of 60 bpm.  Normal axis.  Right bundle branch block.  Nonspecific ST changes without STEMI criteria. ____________________________________________  RADIOLOGY  ED MD interpretation: 2 view CXR reviewed by me without evidence of acute cardiopulmonary pathology.  Official radiology report(s): DG Chest 2 View  Result Date: 09/08/2020 CLINICAL DATA:  Chest pain. EXAM: CHEST - 2 VIEW COMPARISON:  Chest radiograph 11/07/2019 FINDINGS: Stable enlarged cardiac silhouette. Calcified aortic arch. Large hiatal hernia noted. No effusion, infiltrate or pneumothorax. Degenerative osteophytosis of the spine. IMPRESSION: 1. No acute cardiopulmonary findings. 2. Stable cardiomegaly. 3. Large hiatal hernia. 4.  Aortic Atherosclerosis (ICD10-I70.0). Electronically Signed   By: Suzy Bouchard M.D.   On: 09/08/2020 10:32    ____________________________________________   PROCEDURES and INTERVENTIONS  Procedure(s) performed (including Critical Care):  .1-3 Lead EKG Interpretation Performed by: Vladimir Crofts, MD Authorized by: Vladimir Crofts, MD      Interpretation: normal     ECG rate:  62   ECG rate assessment: normal     Rhythm: sinus rhythm     Ectopy: none     Conduction: normal   .Critical Care Performed by: Vladimir Crofts, MD Authorized by: Vladimir Crofts, MD   Critical care provider statement:    Critical care time (minutes):  45   Critical care was necessary to treat or prevent imminent or life-threatening deterioration of the following conditions:  Cardiac failure   Critical care was time spent personally by me on the following activities:  Discussions with consultants, evaluation of patient's response to treatment, examination of patient, ordering and performing treatments and interventions, ordering and review of laboratory studies, ordering and review of radiographic studies, pulse oximetry, re-evaluation of patient's condition, obtaining history from patient or surrogate and review of old charts    Medications  nitroGLYCERIN (NITROSTAT) SL tablet 0.4 mg (has no administration in time range)  heparin bolus via infusion 3,800 Units ( Intravenous Not Given 09/08/20 1133)  heparin ADULT infusion 100 units/mL (25000 units/259mL) (750 Units/hr Intravenous New Bag/Given 09/08/20 1150)  lactated ringers bolus  1,000 mL (0 mLs Intravenous Stopped 09/08/20 1146)  heparin injection 3,800 Units (3,800 Units Intravenous Given 09/08/20 1113)    ____________________________________________   MDM / ED COURSE   87 year old woman presents from home with chest pain, with evidence of NSTEMI and hyperglycemia without acidosis, requiring heparinization and medical admission.  Hemodynamically stable and slightly hypertensive.  Exam demonstrates a pleasant elderly woman without evidence of acute distress, neurovascular deficits or any significant derangements.  She does have some tenderness upon palpation to her chest, but no overlying skin changes or signs of trauma.  Blood work with hyperglycemia, but no acidosis to suggest DKA or HHS.  Further with  significantly elevated troponin to about 3000.  No EKG changes to suggest STEMI, but with her continued chest pain was concerned about NSTEMI.  Aspirin provided by EMS, and we will initiate heparin bolus and drip.  Repeat EKG without STEMI.  CXR demonstrates no infiltrates or PTX.  We will discussed the case with hospitalist for admission      ____________________________________________   FINAL CLINICAL IMPRESSION(S) / ED DIAGNOSES  Final diagnoses:  Hyperglycemia  NSTEMI (non-ST elevated myocardial infarction) [facility])  Other chest pain     ED Discharge Orders    None       Clarke Amburn   Note:  This document was prepared using Dragon voice recognition software and may include unintentional dictation errors.   Vladimir Crofts, MD 09/08/20 [phone]

## 2020-09-08 NOTE — ED Notes (Signed)
Called and spoke with lab, informed them we needed to add aPTT and PT/INR to blue top that was sent down, lab stated that tube has hemolyzed and will need to be redrawn.  Crystal RN will draw new blue top tube when she inserts 2nd IV

## 2020-09-08 NOTE — ED Triage Notes (Signed)
BIB EMS coming from home. Called for CP that started last night. Substernal, non radiating. No N/N/dizziness.. No current pain except on palpation.   324 ASA HX Afib and DM BGL for EMS read HIGH. 20G LAC, 200ML of NS 175/76 87 97%RA

## 2020-09-08 NOTE — H&P (Addendum)
History and Physical    Shakayla Hickox Buelna BDZ:[id number] DOB: Jun 10, 1924 DOA: 09/08/2020  PCP: Maryland Pink, MD   Patient coming from: Home  I have personally briefly reviewed patient's old medical records in [place]  Chief Complaint: Chest pain  HPI: Sabrinna Yearwood Bujak is a 87 y.o. female with medical history significant for diabetes mellitus, history of paroxysmal atrial fibrillation, hypothyroidism, hypertension who presents to the ER via EMS for evaluation of chest pain.  Chest pain started the night prior to admission after she had gone to bed.  It was substernal and nonradiating.  Pain was constant with no known relieving or aggravating factors.  She denied having any nausea, no vomiting, no diaphoresis, no shortness of breath or palpitations. At the time of this history and physical she is currently chest pain-free. At baseline patient lives alone and is independent in all activities of daily living. She denies having any abdominal pain, no diarrhea, no constipation, no dizziness, no lightheadedness, no urinary frequency, no dysuria, no headache, no blurred vision, no focal deficits. Labs show sodium 131, potassium 4.2, chloride 97, bicarb 25, glucose 569, BUN 29, creatinine 1.7, calcium 8.6, magnesium 1.5, alkaline phosphatase 86, AST 28, ALT 18, total protein 6.7, troponin 2984 >> 3629, white count 11.9, hemoglobin 12.8, hematocrit 36.5, MCV 96.1, RDW 13.9, platelet count 223, PT 12.9, INR 1.0 Urine analysis shows glucosuria but no pyuria Respiratory viral panel is negative Chest x-ray reviewed by me shows no acute cardiopulmonary disease.  Cardiomegaly.  Large hiatal hernia. EKG reviewed by me shows atrial fibrillation, right bundle branch block, T wave changes in the lateral leads   ED Course: Patient is a 87 year old Caucasian female who lives independently and is able to Carry out her activities of daily living.  She presents to the ER via EMS for evaluation of substernal chest  pain that started at rest, pain was nonradiating without any associated symptoms.  EMS notes hyperglycemia and patient received 200 cc of IV fluid in route to the hospital.  Labs showed elevated troponin levels consistent with an acute non-ST elevation MI.  She is currently chest pain-free.  She will be admitted to the hospital for further evaluation.  Review of Systems: As per HPI otherwise all other systems reviewed and negative.    Past Medical History:  Diagnosis Date  . Abnormal mammogram, unspecified   . Actinic keratosis   . Breast screening, unspecified   . Diabetes ([place])   . Hypertension   . Mammographic microcalcification   . Squamous cell carcinoma of skin 06/14/2019   Left superior forehead. Well differentiated. Tx: EDC  . Squamous cell carcinoma of skin 09/13/2019   L of midline med forehead at hairline SCCIS   . Vaginal pessary present     Past Surgical History:  Procedure Laterality Date  . BREAST BIOPSY Left 2013   stereo calcs  . BREAST EXCISIONAL BIOPSY Left    "years ago"-benign  . ESOPHAGOGASTRODUODENOSCOPY Left 06/12/2018   Procedure: ESOPHAGOGASTRODUODENOSCOPY (EGD);  Surgeon: Virgel Manifold, MD;  Location: [facility] ENDOSCOPY;  Service: Endoscopy;  Laterality: Left;  . ESOPHAGOGASTRODUODENOSCOPY N/A 08/31/2019   Procedure: ESOPHAGOGASTRODUODENOSCOPY (EGD);  Surgeon: Toledo, Benay Pike, MD;  Location: [facility] ENDOSCOPY;  Service: Gastroenterology;  Laterality: N/A;  . SKIN LESION EXCISION       reports that she has never smoked. She has never used smokeless tobacco. She reports that she does not drink alcohol and does not use drugs.  Allergies  Allergen Reactions  . Oxycodone  Hcl Other (See Comments)    Other Reaction: GI Upset Other reaction(s): Other (See Comments) Other Reaction: GI Upset   . Oxycodone-Aspirin Other (See Comments)    Other Reaction: GI Upset (Per pt she does take aspirin 81 mg daily  . Augmentin [Amoxicillin-Pot Clavulanate] Nausea Only   . Codeine Nausea Only  . Etodolac Other (See Comments)  . Iodine Nausea Only  . Levaquin [Levofloxacin In D5w] Nausea Only  . Levofloxacin Nausea Only  . Metronidazole Nausea Only  . Ultram [Tramadol] Nausea Only  . Sulfa Antibiotics Nausea Only and Other (See Comments)    Other reaction(s): Unknown     Family History  Problem Relation Age of Onset  . Cancer Mother   . Breast cancer Neg Hx       Prior to Admission medications   Medication Sig Start Date End Date Taking? Authorizing Provider  allopurinol (ZYLOPRIM) 100 MG tablet Take 100 mg by mouth daily. 08/23/19  Yes [provider]  donepezil (ARICEPT) 5 MG tablet Take 1 tablet by mouth every evening. 07/20/20  Yes [provider]  ferrous sulfate 325 (65 FE) MG EC tablet Take 1 tablet (325 mg total) by mouth 2 (two) times daily. 08/31/19 08/30/20 Yes Lorella Nimrod, MD  glipiZIDE (GLUCOTROL XL) 5 MG 24 hr tablet Take 5 mg by mouth 2 (two) times daily.  07/01/18  Yes [provider]  irbesartan-hydrochlorothiazide (AVALIDE) 300-12.5 MG tablet Take 1 tablet by mouth daily.    Yes [provider]  levothyroxine (SYNTHROID, LEVOTHROID) 50 MCG tablet Take 50 mcg by mouth daily.    Yes [provider]  metoprolol tartrate (LOPRESSOR) 25 MG tablet Take 25 mg by mouth 2 (two) times daily.   Yes [provider]  pantoprazole (PROTONIX) 40 MG tablet Take 1 tablet (40 mg total) by mouth daily. 08/31/19 08/30/20 Yes Lorella Nimrod, MD  pioglitazone (ACTOS) 30 MG tablet Take 30 mg by mouth daily. 07/12/19  Yes [provider]  simvastatin (ZOCOR) 20 MG tablet Take 20 mg by mouth every evening.    Yes [provider]  TRADJENTA 5 MG TABS tablet Take 5 mg by mouth daily.  07/01/18  Yes [provider]  Vitamin D, Ergocalciferol, (DRISDOL) 1.25 MG (50000 UT) CAPS capsule Take 50,000 Units by mouth every Saturday.  08/04/18  Yes [provider]  cyanocobalamin  (,VITAMIN B-12,) 1000 MCG/ML injection Inject 1,000 mcg into the muscle every 30 (thirty) days.  10/25/15   [provider]    Physical Exam: Vitals:   09/08/20 1100 09/08/20 1115 09/08/20 1130 09/08/20 1200  BP: (!) 174/65 (!) 179/68 (!) 171/66 (!) 147/78  Pulse: (!) 57 (!) 58 (!) 56 (!) 56  Resp: 14 15 (!) 21 13  Temp:      TempSrc:      SpO2: 98% 98% 100% 98%  Weight:      Height:         Vitals:   09/08/20 1100 09/08/20 1115 09/08/20 1130 09/08/20 1200  BP: (!) 174/65 (!) 179/68 (!) 171/66 (!) 147/78  Pulse: (!) 57 (!) 58 (!) 56 (!) 56  Resp: 14 15 (!) 21 13  Temp:      TempSrc:      SpO2: 98% 98% 100% 98%  Weight:      Height:          Constitutional: Alert and oriented x 3 . Not in any apparent distress HEENT:      Head: Normocephalic and  atraumatic.         Eyes: PERLA, EOMI, Conjunctivae are normal. Sclera is non-icteric.       Mouth/Throat: Mucous membranes are moist.       Neck: Supple with no signs of meningismus. Cardiovascular:  Irregularly irregular, bradycardic no murmurs, gallops, or rubs. 2+ symmetrical distal pulses are present . No JVD. No LE edema Respiratory: Respiratory effort normal .Lungs sounds clear bilaterally. No wheezes, crackles, or rhonchi.  Gastrointestinal: Soft, non tender, and non distended with positive bowel sounds.  Central adiposity Genitourinary: No CVA tenderness. Musculoskeletal: Nontender with normal range of motion in all extremities. No cyanosis, or erythema of extremities. Neurologic:  Face is symmetric. Moving all extremities. No gross focal neurologic deficits  Skin: Skin is warm, dry.  No rash or ulcers Psychiatric: Mood and affect are normal   Labs on Admission: I have personally reviewed following labs and imaging studies  CBC: Recent Labs  Lab 09/08/20 0948  WBC 11.9*  NEUTROABS 9.6*  HGB 12.8  HCT 36.5  MCV 96.1  PLT 299   Basic Metabolic Panel: Recent Labs  Lab 09/08/20 0948  NA 131*  K 4.2   CL 97*  CO2 25  GLUCOSE 569*  BUN 29*  CREATININE 1.71*  CALCIUM 8.6*  MG 1.5*   GFR: Estimated Creatinine Clearance: 18.1 mL/min (A) (by C-G formula based on SCr of 1.71 mg/dL (H)). Liver Function Tests: Recent Labs  Lab 09/08/20 0948  AST 28  ALT 18  ALKPHOS 86  BILITOT 1.3*  PROT 6.7  ALBUMIN 3.4*   No results for input(s): LIPASE, AMYLASE in the last 168 hours. No results for input(s): AMMONIA in the last 168 hours. Coagulation Profile: Recent Labs  Lab 09/08/20 1055  INR 1.0   Cardiac Enzymes: No results for input(s): CKTOTAL, CKMB, CKMBINDEX, TROPONINI in the last 168 hours. BNP (last 3 results) No results for input(s): PROBNP in the last 8760 hours. HbA1C: No results for input(s): HGBA1C in the last 72 hours. CBG: Recent Labs  Lab 09/08/20 0942  GLUCAP 555*   Lipid Profile: No results for input(s): CHOL, HDL, LDLCALC, TRIG, CHOLHDL, LDLDIRECT in the last 72 hours. Thyroid Function Tests: No results for input(s): TSH, T4TOTAL, FREET4, T3FREE, THYROIDAB in the last 72 hours. Anemia Panel: No results for input(s): VITAMINB12, FOLATE, FERRITIN, TIBC, IRON, RETICCTPCT in the last 72 hours. Urine analysis:    Component Value Date/Time   COLORURINE STRAW (A) 09/08/2020 0950   APPEARANCEUR CLEAR (A) 09/08/2020 0950   APPEARANCEUR Clear 05/05/2014 1202   LABSPEC 1.020 09/08/2020 0950   LABSPEC 1.008 05/05/2014 1202   PHURINE 5.0 09/08/2020 0950   GLUCOSEU >=500 (A) 09/08/2020 0950   GLUCOSEU Negative 05/05/2014 1202   HGBUR SMALL (A) 09/08/2020 0950   BILIRUBINUR NEGATIVE 09/08/2020 0950   BILIRUBINUR Negative 05/05/2014 1202   KETONESUR NEGATIVE 09/08/2020 0950   PROTEINUR NEGATIVE 09/08/2020 0950   NITRITE NEGATIVE 09/08/2020 0950   LEUKOCYTESUR NEGATIVE 09/08/2020 0950   LEUKOCYTESUR 1+ 05/05/2014 1202    Radiological Exams on Admission: DG Chest 2 View  Result Date: 09/08/2020 CLINICAL DATA:  Chest pain. EXAM: CHEST - 2 VIEW COMPARISON:  Chest  radiograph 11/07/2019 FINDINGS: Stable enlarged cardiac silhouette. Calcified aortic arch. Large hiatal hernia noted. No effusion, infiltrate or pneumothorax. Degenerative osteophytosis of the spine. IMPRESSION: 1. No acute cardiopulmonary findings. 2. Stable cardiomegaly. 3. Large hiatal hernia. 4.  Aortic Atherosclerosis (ICD10-I70.0). Electronically Signed   By: Suzy Bouchard M.D.   On: 09/08/2020 10:32  Assessment/Plan Principal Problem:   NSTEMI (non-ST elevated myocardial infarction) ([place]) Active Problems:   A-fib (HCC)   Benign essential HTN   Adult hypothyroidism   CKD stage 4 due to type 2 diabetes mellitus ([place])   Hyponatremia   Non-ST elevation MI Patient's risk factors include age, diabetes mellitus and hypertension She presents for evaluation substernal chest pain and has markedly elevated troponin level We will continue to cycle cardiac enzymes Continue heparin drip initiated in the ER Continue aspirin, statins and beta-blockers with holding parameters since patient has bradycardia. Will obtain 2D echocardiogram to assess LVEF and rule out regional wall motion abnormality. We will consult cardiology   Diabetes mellitus with complications of stage IV chronic kidney disease and hyperglycemia Hold oral hypoglycemic agents Gentle IV fluid hydration Place patient on insulin sliding scale coverage Maintain consistent carbohydrate diet Renal function appears stable    History of paroxysmal atrial fibrillation Patient is bradycardic Not on long-term anticoagulation due to history of GI bleed Continue metoprolol with holding parameters   Hypertension Blood pressure is uncontrolled Continue ibesartan/hydrochlorothiazide and metoprolol   Hypothyroidism Continue Synthroid   Hyponatremia Most likely secondary to hypoglycemia Expect improvement following resolution of hyperglycemia   Hypomagnesemia Supplement magnesium  DVT prophylaxis: Heparin Code  Status: DO NOT RESUSCITATE Family Communication: Greater than 50% of time was spent discussing patient's condition and plan of care with her and her son Edd Arbour Odonoghue at the bedside.  All questions and concerns have been addressed.  They verbalized understanding and agree with the plan.  CODE STATUS was discussed and she is a DO NOT RESUSCITATE Disposition Plan: Back to previous home environment Consults called: Cardiology Status: At the time of admission, it appears that the appropriate admission status for the patient is inpatient.   This is judged to be reasonable and necessary in order to provide the required intensity of service to ensure the patient's safety given the presenting symptoms, physical exam findings and initial radiographic and laboratory data in the context of their comorbid conditions. Patient requires inpatient status due to high intensity of service, high risk for further deterioration and high frequency of surveillance required.    Collier Bullock MD [facility]     09/08/2020, 1:01 PM

## 2020-09-08 NOTE — Consult Note (Signed)
[facility] Cardiology  CARDIOLOGY CONSULT NOTE  Patient ID: Erika Ochoa MRN: [id number] DOB/AGE: 01-07-1925 87 y.o.  Admit date: 09/08/2020 Referring Physician [place] Primary Physician [facility] Primary Cardiologist Nehemiah Massed Reason for Consultation non-ST elevation myocardial infarction  HPI: 87 year old female referred for evaluation of non-ST elevation myocardial infarction.  The patient presents today with recent onset of chest pain.  According to her son, the patient started developing substernal chest discomfort last evening.  In the emergency room, ECG revealed sinus rhythm at 60 bpm with right bundle branch block without acute ischemic ST-T wave changes.  The patient has ruled in for non-ST elevation myocardial infarction with elevated high sensitive troponin 2984 and 3629.  Patient was started on heparin infusion.  The patient currently denies chest pain or shortness of breath.  She has a history of paroxysmal atrial fibrillation, chads Vasc 5, not on chronic anticoagulation because of advanced age and falling risk.  The patient has chronic kidney disease, with BUN and creatinine of 29 and 1.71, respectively with GFR 27.  Review of systems complete and found to be negative unless listed above     Past Medical History:  Diagnosis Date  . Abnormal mammogram, unspecified   . Actinic keratosis   . Breast screening, unspecified   . Diabetes ([place])   . Hypertension   . Mammographic microcalcification   . Squamous cell carcinoma of skin 06/14/2019   Left superior forehead. Well differentiated. Tx: EDC  . Squamous cell carcinoma of skin 09/13/2019   L of midline med forehead at hairline SCCIS   . Vaginal pessary present     Past Surgical History:  Procedure Laterality Date  . BREAST BIOPSY Left 2013   stereo calcs  . BREAST EXCISIONAL BIOPSY Left    "years ago"-benign  . ESOPHAGOGASTRODUODENOSCOPY Left 06/12/2018   Procedure: ESOPHAGOGASTRODUODENOSCOPY (EGD);  Surgeon: Virgel Manifold,  MD;  Location: [facility] ENDOSCOPY;  Service: Endoscopy;  Laterality: Left;  . ESOPHAGOGASTRODUODENOSCOPY N/A 08/31/2019   Procedure: ESOPHAGOGASTRODUODENOSCOPY (EGD);  Surgeon: Toledo, Benay Pike, MD;  Location: [facility] ENDOSCOPY;  Service: Gastroenterology;  Laterality: N/A;  . SKIN LESION EXCISION      (Not in a hospital admission)  Social History   Socioeconomic History  . Marital status: Widowed    Spouse name: Not on file  . Number of children: Not on file  . Years of education: Not on file  . Highest education level: Not on file  Occupational History  . Not on file  Tobacco Use  . Smoking status: Never Smoker  . Smokeless tobacco: Never Used  Vaping Use  . Vaping Use: Never used  Substance and Sexual Activity  . Alcohol use: No    Alcohol/week: 0.0 standard drinks  . Drug use: No  . Sexual activity: Not Currently  Other Topics Concern  . Not on file  Social History Narrative  . Not on file   Social Determinants of Health   Financial Resource Strain: Not on file  Food Insecurity: Not on file  Transportation Needs: Not on file  Physical Activity: Not on file  Stress: Not on file  Social Connections: Not on file  Intimate Partner Violence: Not on file    Family History  Problem Relation Age of Onset  . Cancer Mother   . Breast cancer Neg Hx       Review of systems complete and found to be negative unless listed above      PHYSICAL EXAM  General: Well developed, well nourished, in no acute  distress HEENT:  Normocephalic and atramatic Neck:  No JVD.  Lungs: Clear bilaterally to auscultation and percussion. Heart: HRRR . Normal S1 and S2 without gallops or murmurs.  Abdomen: Bowel sounds are positive, abdomen soft and non-tender  Msk:  Back normal, normal gait. Normal strength and tone for age. Extremities: No clubbing, cyanosis or edema.   Neuro: Alert and oriented X 3. Psych:  Good affect, responds appropriately  Labs:   Lab Results  Component Value Date    WBC 11.9 (H) 09/08/2020   HGB 12.8 09/08/2020   HCT 36.5 09/08/2020   MCV 96.1 09/08/2020   PLT 223 09/08/2020    Recent Labs  Lab 09/08/20 0948  NA 131*  K 4.2  CL 97*  CO2 25  BUN 29*  CREATININE 1.71*  CALCIUM 8.6*  PROT 6.7  BILITOT 1.3*  ALKPHOS 86  ALT 18  AST 28  GLUCOSE 569*   Lab Results  Component Value Date   CKTOTAL 101 02/11/2012   CKMB 5.9 (H) 02/11/2012   TROPONINI <0.03 12/20/2015    Lab Results  Component Value Date   CHOL 165 08/31/2019   CHOL 158 12/20/2015   Lab Results  Component Value Date   HDL 57 08/31/2019   HDL 45 12/20/2015   Lab Results  Component Value Date   LDLCALC 85 08/31/2019   LDLCALC 84 12/20/2015   Lab Results  Component Value Date   TRIG 116 08/31/2019   TRIG 144 12/20/2015   Lab Results  Component Value Date   CHOLHDL 2.9 08/31/2019   CHOLHDL 3.5 12/20/2015   No results found for: LDLDIRECT    Radiology: DG Chest 2 View  Result Date: 09/08/2020 CLINICAL DATA:  Chest pain. EXAM: CHEST - 2 VIEW COMPARISON:  Chest radiograph 11/07/2019 FINDINGS: Stable enlarged cardiac silhouette. Calcified aortic arch. Large hiatal hernia noted. No effusion, infiltrate or pneumothorax. Degenerative osteophytosis of the spine. IMPRESSION: 1. No acute cardiopulmonary findings. 2. Stable cardiomegaly. 3. Large hiatal hernia. 4.  Aortic Atherosclerosis (ICD10-I70.0). Electronically Signed   By: Suzy Bouchard M.D.   On: 09/08/2020 10:32    EKG: EKG reveals sinus rhythm at 60 bpm with right bundle branch block  ASSESSMENT AND PLAN:   1.  Non-ST elevation myocardial infarction, elevated high-sensitivity troponin (2984 and 3669), nondiagnostic ECG, currently chest pain-free on heparin infusion 2.  Paroxysmal atrial fibrillation, CHA2DS2-VASc score 5, not on chronic anticoagulation due to advanced age and falling risk, currently in sinus rhythm 3.  Chronic kidney disease stage IV, high risk for contrast-induced nephrotoxicity 4.   Essential hypertension, systolic blood pressure mildly elevated 5.  Hyperlipidemia, on simvastatin  Recommendations  1.  Agree with current therapy 2.  Continue heparin infusion for 48 hours 3.  Add Plavix 75 mg daily 4.  Add isosorbide mononitrate 30 mg daily 5.  Review 2D echocardiogram 6.  Defer cardiac catheterization at this time.  Following discussion with patient and patient's son, we agreed initially to pursue conservative management in light of patient's advanced age and chronic kidney disease  Signed: Isaias Cowman MD,PhD, [facility] 09/08/2020, 1:31 PM

## 2020-09-08 NOTE — ED Notes (Signed)
Pt given another warm blanket.  

## 2020-09-08 NOTE — Progress Notes (Addendum)
[place] for heparin  Indication: chest pain/ACS - NSTEMI  Allergies  Allergen Reactions  . Oxycodone Hcl Other (See Comments)    Other Reaction: GI Upset Other reaction(s): Other (See Comments) Other Reaction: GI Upset   . Oxycodone-Aspirin Other (See Comments)    Other Reaction: GI Upset (Per pt she does take aspirin 81 mg daily  . Augmentin [Amoxicillin-Pot Clavulanate] Nausea Only  . Codeine Nausea Only  . Etodolac Other (See Comments)  . Iodine Nausea Only  . Levaquin [Levofloxacin In D5w] Nausea Only  . Levofloxacin Nausea Only  . Metronidazole Nausea Only  . Ultram [Tramadol] Nausea Only  . Sulfa Antibiotics Nausea Only and Other (See Comments)    Other reaction(s): Unknown     Patient Measurements: Height: 5\' 5"  (165.1 cm) Weight: 63.5 kg (140 lb) IBW/kg (Calculated) : 57 Heparin Dosing Weight: 63.5 kg  Vital Signs: Temp: 98.6 F (37 C) (04/03 2103) Temp Source: Oral (04/03 1806) BP: 131/63 (04/03 2103) Pulse Rate: 75 (04/03 2103)  Labs: Recent Labs    09/08/20 0948 09/08/20 1055 09/08/20 1146 09/08/20 1452 09/08/20 1735 09/08/20 2030  HGB 12.8  --   --   --   --   --   HCT 36.5  --   --   --   --   --   PLT 223  --   --   --   --   --   APTT  --  35  --   --   --   --   LABPROT  --  12.9  --   --   --   --   INR  --  1.0  --   --   --   --   HEPARINUNFRC  --   --   --   --   --  0.28*  CREATININE 1.71*  --   --   --   --   --   TROPONINIHS 2,984*  --  3,629* 3,629* 3,175*  --     Estimated Creatinine Clearance: 18.1 mL/min (A) (by C-G formula based on SCr of 1.71 mg/dL (H)).   Medical History: Past Medical History:  Diagnosis Date  . Abnormal mammogram, unspecified   . Actinic keratosis   . Breast screening, unspecified   . Diabetes ([place])   . Hypertension   . Mammographic microcalcification   . Squamous cell carcinoma of skin 06/14/2019   Left superior forehead. Well differentiated. Tx: EDC  .  Squamous cell carcinoma of skin 09/13/2019   L of midline med forehead at hairline SCCIS   . Vaginal pessary present     Medications:  No PTA anticoagulation per chart review and med rec tech confirmed with patient  Assessment: 87yo female who presented to ED for chest pain. Pt has history of  HTN, DM on multiple oral agents, hypothyroidism.  Previous paroxysmal A. fib for which she follows with cardiology, and not on anticoagulation due to GI bleed risk. Trop HS 2984; baseline H&H and Plt WNL. Pharmacy has been consulted for heparin dosing and monitoring.   4/3 INITIL: 3800 unit bolus x 1, heparin infusion @ 750 units/hr 4/3 2030 HL = 0.28   Goal of Therapy:  Heparin level 0.3-0.7 units/ml Monitor platelets by anticoagulation protocol: Yes   Plan:  Heparin level slightly subtherapeutic, will give bolus dose of 950 units and increase heparin infusion to 900 units/hr Check anti-Xa level in 8 hours following rate change and  daily while on heparin Continue to monitor H&H and platelets  Per cardiology: plan for heparin infusion x 48 hours  Dorothe Pea, PharmD, BCPS Clinical Pharmacist  09/08/2020 9:10 PM

## 2020-09-08 NOTE — ED Notes (Signed)
Pt given 324 mg of Aspirin prior to arrival

## 2020-09-08 NOTE — ED Notes (Signed)
Dr. Paraschos at bedside. 

## 2020-09-08 NOTE — Progress Notes (Signed)
[facility] for heparin  Indication: chest pain/ACS - NSTEMI  Allergies  Allergen Reactions  . Oxycodone Hcl Other (See Comments)    Other Reaction: GI Upset Other reaction(s): Other (See Comments) Other Reaction: GI Upset   . Oxycodone-Aspirin Other (See Comments)    Other Reaction: GI Upset (Per pt she does take aspirin 81 mg daily  . Augmentin [Amoxicillin-Pot Clavulanate] Nausea Only  . Codeine Nausea Only  . Etodolac Other (See Comments)  . Iodine Nausea Only  . Levaquin [Levofloxacin In D5w] Nausea Only  . Levofloxacin Nausea Only  . Metronidazole Nausea Only  . Ultram [Tramadol] Nausea Only  . Sulfa Antibiotics Nausea Only and Other (See Comments)    Other reaction(s): Unknown     Patient Measurements: Height: 5\' 5"  (165.1 cm) Weight: 63.5 kg (140 lb) IBW/kg (Calculated) : 57 Heparin Dosing Weight: 63.5 kg  Vital Signs: Temp: 97.9 F (36.6 C) (04/03 0945) Temp Source: Oral (04/03 0945) BP: 174/65 (04/03 1100) Pulse Rate: 57 (04/03 1100)  Labs: Recent Labs    09/08/20 0948 09/08/20 1055  HGB 12.8  --   HCT 36.5  --   PLT 223  --   APTT  --  35  LABPROT  --  12.9  INR  --  1.0  CREATININE 1.71*  --   TROPONINIHS 2,984*  --     Estimated Creatinine Clearance: 18.1 mL/min (A) (by C-G formula based on SCr of 1.71 mg/dL (H)).   Medical History: Past Medical History:  Diagnosis Date  . Abnormal mammogram, unspecified   . Actinic keratosis   . Breast screening, unspecified   . Diabetes ([place])   . Hypertension   . Mammographic microcalcification   . Squamous cell carcinoma of skin 06/14/2019   Left superior forehead. Well differentiated. Tx: EDC  . Squamous cell carcinoma of skin 09/13/2019   L of midline med forehead at hairline SCCIS   . Vaginal pessary present     Medications:  No PTA anticoagulation per chart review and med rec tech confirmed with patient  Assessment: 87yo female who presented to ED for  chest pain. Pt has history of  HTN, DM on multiple oral agents, hypothyroidism.  Previous paroxysmal A. fib for which she follows with cardiology, and not on anticoagulation due to GI bleed risk. Trop HS 2984; baseline H&H and Plt WNL. Pharmacy has been consulted for heparin dosing and monitoring.   Goal of Therapy:  Heparin level 0.3-0.7 units/ml Monitor platelets by anticoagulation protocol: Yes   Plan:  Give 3800 units bolus x 1 Start heparin infusion at 850 units/hr Check anti-Xa level in 8 hours and daily while on heparin Continue to monitor H&H and platelets  Sherilyn Banker, PharmD Pharmacy Resident  09/08/2020 11:38 AM

## 2020-09-08 NOTE — ED Notes (Addendum)
Pt given water  Family member at bedside

## 2020-09-08 NOTE — ED Notes (Signed)
Dr Smith at bedside speaking with pt. 

## 2020-09-09 ENCOUNTER — Inpatient Hospital Stay
Admit: 2020-09-09 | Discharge: 2020-09-09 | Disposition: A | Discharge status: Home / Self Care | Payer: Medicare HMO | Attending: Internal Medicine | Admitting: Internal Medicine

## 2020-09-09 DIAGNOSIS — E1122 Type 2 diabetes mellitus with diabetic chronic kidney disease: Secondary | ICD-10-CM

## 2020-09-09 DIAGNOSIS — E1169 Type 2 diabetes mellitus with other specified complication: Secondary | ICD-10-CM

## 2020-09-09 DIAGNOSIS — E785 Hyperlipidemia, unspecified: Secondary | ICD-10-CM

## 2020-09-09 DIAGNOSIS — N184 Chronic kidney disease, stage 4 (severe): Secondary | ICD-10-CM

## 2020-09-09 DIAGNOSIS — I471 Supraventricular tachycardia: Secondary | ICD-10-CM

## 2020-09-09 DIAGNOSIS — I214 Non-ST elevation (NSTEMI) myocardial infarction: Principal | ICD-10-CM

## 2020-09-09 DIAGNOSIS — E871 Hypo-osmolality and hyponatremia: Secondary | ICD-10-CM

## 2020-09-09 DIAGNOSIS — I10 Essential (primary) hypertension: Secondary | ICD-10-CM

## 2020-09-09 LAB — CBC
HCT: 34.5 % — ABNORMAL LOW (ref 36.0–46.0)
Hemoglobin: 12 g/dL (ref 12.0–15.0)
MCH: 33.2 pg (ref 26.0–34.0)
MCHC: 34.8 g/dL (ref 30.0–36.0)
MCV: 95.6 fL (ref 80.0–100.0)
Platelets: 205 10*3/uL (ref 150–400)
RBC: 3.61 MIL/uL — ABNORMAL LOW (ref 3.87–5.11)
RDW: 13.9 % (ref 11.5–15.5)
WBC: 11.9 10*3/uL — ABNORMAL HIGH (ref 4.0–10.5)
nRBC: 0 % (ref 0.0–0.2)

## 2020-09-09 LAB — ECHOCARDIOGRAM COMPLETE
AR max vel: 2.2 cm2
AV Area VTI: 2.12 cm2
AV Area mean vel: 2.1 cm2
AV Mean grad: 15 mmHg
AV Peak grad: 23 mmHg
Ao pk vel: 2.4 m/s
Area-P 1/2: 5.93 cm2
Height: 65 in
MV VTI: 4.56 cm2
S' Lateral: 3.3 cm
Weight: 2240 oz

## 2020-09-09 LAB — GLUCOSE, CAPILLARY
Glucose-Capillary: 164 mg/dL — ABNORMAL HIGH (ref 70–99)
Glucose-Capillary: 165 mg/dL — ABNORMAL HIGH (ref 70–99)
Glucose-Capillary: 283 mg/dL — ABNORMAL HIGH (ref 70–99)
Glucose-Capillary: 418 mg/dL — ABNORMAL HIGH (ref 70–99)

## 2020-09-09 LAB — BASIC METABOLIC PANEL
Anion gap: 6 (ref 5–15)
BUN: 31 mg/dL — ABNORMAL HIGH (ref 8–23)
CO2: 21 mmol/L — ABNORMAL LOW (ref 22–32)
Calcium: 8.3 mg/dL — ABNORMAL LOW (ref 8.9–10.3)
Chloride: 107 mmol/L (ref 98–111)
Creatinine, Ser: 1.72 mg/dL — ABNORMAL HIGH (ref 0.44–1.00)
GFR, Estimated: 27 mL/min — ABNORMAL LOW (ref >60)
Glucose, Bld: 278 mg/dL — ABNORMAL HIGH (ref 70–99)
Potassium: 4.2 mmol/L (ref 3.5–5.1)
Sodium: 134 mmol/L — ABNORMAL LOW (ref 135–145)

## 2020-09-09 LAB — MAGNESIUM: Magnesium: 1.7 mg/dL (ref 1.7–2.4)

## 2020-09-09 LAB — HEPARIN LEVEL (UNFRACTIONATED)
Heparin Unfractionated: 0.28 IU/mL — ABNORMAL LOW (ref 0.30–0.70)
Heparin Unfractionated: 0.79 IU/mL — ABNORMAL HIGH (ref 0.30–0.70)

## 2020-09-09 LAB — HEMOGLOBIN A1C
Hgb A1c MFr Bld: 13.3 % — ABNORMAL HIGH (ref 4.8–5.6)
Mean Plasma Glucose: 335.01 mg/dL

## 2020-09-09 MED ORDER — METOPROLOL TARTRATE 5 MG/5ML IV SOLN
5.0000 mg | Freq: Once | INTRAVENOUS | Status: AC
  Administered 2020-09-09: 5 mg via INTRAVENOUS

## 2020-09-09 MED ORDER — INSULIN GLARGINE 100 UNIT/ML ~~LOC~~ SOLN
6.0000 [IU] | Freq: Every day | SUBCUTANEOUS | Status: DC
  Administered 2020-09-10: 6 [IU] via SUBCUTANEOUS
  Filled 2020-09-09 (×2): qty 0.06

## 2020-09-09 MED ORDER — METOPROLOL TARTRATE 5 MG/5ML IV SOLN
INTRAVENOUS | Status: AC
  Filled 2020-09-09: qty 5

## 2020-09-09 MED ORDER — INSULIN GLARGINE 100 UNIT/ML ~~LOC~~ SOLN
15.0000 [IU] | Freq: Every day | SUBCUTANEOUS | Status: DC
  Administered 2020-09-09: 15 [IU] via SUBCUTANEOUS
  Filled 2020-09-09: qty 0.15

## 2020-09-09 MED ORDER — INSULIN ASPART 100 UNIT/ML ~~LOC~~ SOLN
3.0000 [IU] | Freq: Three times a day (TID) | SUBCUTANEOUS | Status: DC
  Administered 2020-09-09: 3 [IU] via SUBCUTANEOUS
  Filled 2020-09-09: qty 1

## 2020-09-09 MED ORDER — MAGNESIUM SULFATE 2 GM/50ML IV SOLN
2.0000 g | Freq: Once | INTRAVENOUS | Status: AC
  Administered 2020-09-09: 2 g via INTRAVENOUS
  Filled 2020-09-09: qty 50

## 2020-09-09 MED ORDER — METOPROLOL TARTRATE 5 MG/5ML IV SOLN: 5.0000 mg | INTRAVENOUS | Status: DC | PRN

## 2020-09-09 MED ORDER — DILTIAZEM HCL ER COATED BEADS 120 MG PO CP24
120.0000 mg | ORAL_CAPSULE | Freq: Every day | ORAL | Status: DC
  Administered 2020-09-09 – 2020-09-10 (×2): 120 mg via ORAL
  Filled 2020-09-09 (×2): qty 1

## 2020-09-09 MED ORDER — INSULIN ASPART 100 UNIT/ML ~~LOC~~ SOLN
2.0000 [IU] | Freq: Three times a day (TID) | SUBCUTANEOUS | Status: DC
  Administered 2020-09-09 – 2020-09-10 (×3): 2 [IU] via SUBCUTANEOUS
  Filled 2020-09-09 (×3): qty 1

## 2020-09-09 MED ORDER — DILTIAZEM HCL 30 MG PO TABS
60.0000 mg | ORAL_TABLET | Freq: Three times a day (TID) | ORAL | Status: DC
  Administered 2020-09-09: 60 mg via ORAL
  Filled 2020-09-09: qty 2

## 2020-09-09 MED ORDER — DILTIAZEM HCL 25 MG/5ML IV SOLN
15.0000 mg | Freq: Once | INTRAVENOUS | Status: AC
  Administered 2020-09-09: 15 mg via INTRAVENOUS
  Filled 2020-09-09: qty 5

## 2020-09-09 MED ORDER — ADENOSINE 6 MG/2ML IV SOLN
12.0000 mg | Freq: Once | INTRAVENOUS | Status: AC
  Administered 2020-09-09: 12 mg via INTRAVENOUS

## 2020-09-09 MED ORDER — ADENOSINE 6 MG/2ML IV SOLN
6.0000 mg | Freq: Once | INTRAVENOUS | Status: AC
  Administered 2020-09-09: 6 mg via INTRAVENOUS
  Filled 2020-09-09: qty 2

## 2020-09-09 MED ORDER — METOPROLOL TARTRATE 50 MG PO TABS
50.0000 mg | ORAL_TABLET | Freq: Two times a day (BID) | ORAL | Status: DC
  Administered 2020-09-09 – 2020-09-10 (×2): 50 mg via ORAL
  Filled 2020-09-09 (×2): qty 1

## 2020-09-09 MED ORDER — ADENOSINE 6 MG/2ML IV SOLN
INTRAVENOUS | Status: AC
  Administered 2020-09-09: 12 mg
  Filled 2020-09-09: qty 4

## 2020-09-09 MED ORDER — DILTIAZEM HCL 25 MG/5ML IV SOLN: 10.0000 mg | Freq: Once | INTRAVENOUS | Status: DC

## 2020-09-09 NOTE — Consult Note (Addendum)
ANTICOAGULATION CONSULT NOTE - Follow Up Consult  Pharmacy Consult for Heparin Indication: chest pain/ACS  Allergies  Allergen Reactions  . Oxycodone Hcl Other (See Comments)    Other Reaction: GI Upset Other reaction(s): Other (See Comments) Other Reaction: GI Upset   . Oxycodone-Aspirin Other (See Comments)    Other Reaction: GI Upset (Per pt she does take aspirin 81 mg daily  . Augmentin [Amoxicillin-Pot Clavulanate] Nausea Only  . Codeine Nausea Only  . Etodolac Other (See Comments)  . Iodine Nausea Only  . Levaquin [Levofloxacin In D5w] Nausea Only  . Levofloxacin Nausea Only  . Metronidazole Nausea Only  . Ultram [Tramadol] Nausea Only  . Sulfa Antibiotics Nausea Only and Other (See Comments)    Other reaction(s): Unknown     Patient Measurements: Height: 5\' 5"  (165.1 cm) Weight: 63.5 kg (140 lb) IBW/kg (Calculated) : 57 Heparin Dosing Weight: 63.5 kg   Vital Signs: Temp: 98.9 F (37.2 C) (04/04 0358) BP: 137/54 (04/04 0358) Pulse Rate: 86 (04/04 0358)  Labs: Recent Labs    09/08/20 0948 09/08/20 1055 09/08/20 1146 09/08/20 1452 09/08/20 1735 09/08/20 2030 09/09/20 0719  HGB 12.8  --   --   --   --   --  12.0  HCT 36.5  --   --   --   --   --  34.5*  PLT 223  --   --   --   --   --  205  APTT  --  35  --   --   --   --   --   LABPROT  --  12.9  --   --   --   --   --   INR  --  1.0  --   --   --   --   --   HEPARINUNFRC  --   --   --   --   --  0.28* 0.28*  CREATININE 1.71*  --   --   --   --   --  1.72*  TROPONINIHS 2,984*  --  3,629* 3,629* 3,175*  --   --     Estimated Creatinine Clearance: 18 mL/min (A) (by C-G formula based on SCr of 1.72 mg/dL (H)).   Medications:  Medications Prior to Admission  Medication Sig Dispense Refill Last Dose  . allopurinol (ZYLOPRIM) 100 MG tablet Take 100 mg by mouth daily.   09/08/2020 at AM  . donepezil (ARICEPT) 5 MG tablet Take 1 tablet by mouth every evening.   09/07/2020 at PM  . ferrous sulfate 325 (65  FE) MG EC tablet Take 1 tablet (325 mg total) by mouth 2 (two) times daily. 60 tablet 3 09/07/2020 at PM  . glipiZIDE (GLUCOTROL XL) 5 MG 24 hr tablet Take 5 mg by mouth 2 (two) times daily.    09/07/2020 at PM  . irbesartan-hydrochlorothiazide (AVALIDE) 300-12.5 MG tablet Take 1 tablet by mouth daily.    09/08/2020 at AM  . levothyroxine (SYNTHROID, LEVOTHROID) 50 MCG tablet Take 50 mcg by mouth daily.    09/08/2020 at AM  . metoprolol tartrate (LOPRESSOR) 25 MG tablet Take 25 mg by mouth 2 (two) times daily.   09/08/2020 at AM  . pantoprazole (PROTONIX) 40 MG tablet Take 1 tablet (40 mg total) by mouth daily. 30 tablet 1 09/08/2020 at AM  . pioglitazone (ACTOS) 30 MG tablet Take 30 mg by mouth daily.   09/08/2020 at AM  . simvastatin (ZOCOR) 20 MG  tablet Take 20 mg by mouth every evening.    09/07/2020 at PM  . TRADJENTA 5 MG TABS tablet Take 5 mg by mouth daily.    09/08/2020 at AM  . Vitamin D, Ergocalciferol, (DRISDOL) 1.25 MG (50000 UT) CAPS capsule Take 50,000 Units by mouth every Saturday.    09/07/2020 at PM  . cyanocobalamin (,VITAMIN B-12,) 1000 MCG/ML injection Inject 1,000 mcg into the muscle every 30 (thirty) days.       Scheduled:  . allopurinol  100 mg Oral Daily  . aspirin EC  81 mg Oral Daily  . clopidogrel  75 mg Oral Daily  . donepezil  5 mg Oral QPM  . irbesartan  300 mg Oral Daily   And  . hydrochlorothiazide  12.5 mg Oral Daily  . insulin aspart  0-15 Units Subcutaneous TID WC  . isosorbide mononitrate  30 mg Oral Daily  . levothyroxine  50 mcg Oral Q0600  . metoprolol tartrate  25 mg Oral BID  . pantoprazole  40 mg Oral Daily  . simvastatin  20 mg Oral QPM  . [START ON 09/14/2020] Vitamin D (Ergocalciferol)  50,000 Units Oral Q Sat   Infusions:  . sodium chloride 100 mL/hr at 09/09/20 0301  . heparin 900 Units/hr (09/09/20 0213)   PRN: acetaminophen, nitroGLYCERIN, ondansetron (ZOFRAN) IV Anti-infectives (From admission, onward)   None      Assessment: 87yo female who  presented to ED for chest pain. Pt has history of HTN, DM on multiple oral agents, hypothyroidism. Previous paroxysmal A. fib for which she follows with cardiology, and not on anticoagulation due to advance age and fall risk. Trop HS 2984; baseline H&H and Plt WNL. Pharmacy has been consulted for heparin dosing and monitoring.   4/3 2030 HL 0.28 increased infusion to 1050 units/hr.   Goal of Therapy:  Heparin level 0.3-0.7 units/ml Monitor platelets by anticoagulation protocol: Yes   Plan:  Heparin level is slightly subtherapeutic. Will increase heparin infusion to 1050 units/hr. Recheck heparin level in 8 hour. CBC daily while on heparin. Plan is to continue heparin for 48 hours per cards.   Oswald Hillock, PharmD,  09/09/2020,8:24 AM

## 2020-09-09 NOTE — Progress Notes (Signed)
*  PRELIMINARY RESULTS* Echocardiogram 2D Echocardiogram has been performed.  Erika Ochoa 09/09/2020, 8:01 AM

## 2020-09-09 NOTE — Progress Notes (Signed)
Patient ID: Erika Ochoa, female   DOB: Oct 21, 1924, 87 y.o.   MRN: [id number] [facility] PROGRESS NOTE  Jacky Hartung [place] CHE:[id number] DOB: [birth year] DOA: 09/08/2020 PCP: [place] Pink, MD  HPI/Subjective: Patient seen early this morning and she was feeling okay.  I got called back to see her this morning because she was in SVT with heart rate ranging between 150 and 170.  I was present in the room with pushing adenosine 6 mg and then 12 mg.  With the 12 mg almost converted but went back into SVT.  We pushed 5 mg of metoprolol and gave oral metoprolol and heart rate did come down to about 120.  Later after Cardizem was pushed the patient did convert over to normal sinus rhythm.  Admitted with NSTEMI.  Patient did have chest pain with fast heart rate.  Objective: Vitals:   09/09/20 0358 09/09/20 0831  BP: (!) 137/54 (!) 154/63  Pulse: 86 96  Resp: 18 19  Temp: 98.9 F (37.2 C) 98.2 F (36.8 C)  SpO2: 96% 95%    Intake/Output Summary (Last 24 hours) at 09/09/2020 1021 Last data filed at 09/09/2020 0945 Gross per 24 hour  Intake 1305.94 ml  Output --  Net 1305.94 ml   Filed Weights   09/08/20 0939  Weight: 63.5 kg    ROS: Review of Systems  Respiratory: Negative for cough and shortness of breath.   Cardiovascular: Positive for chest pain.  Gastrointestinal: Negative for abdominal pain, nausea and vomiting.   Exam: Physical Exam HENT:     Head: Normocephalic.     Mouth/Throat:     Pharynx: No oropharyngeal exudate.  Eyes:     General: Lids are normal.     Conjunctiva/sclera: Conjunctivae normal.  Cardiovascular:     Rate and Rhythm: Regular rhythm. Tachycardia present.     Heart sounds: Normal heart sounds, S1 normal and S2 normal.  Pulmonary:     Breath sounds: Examination of the right-lower field reveals decreased breath sounds. Examination of the left-lower field reveals decreased breath sounds. Decreased breath sounds present. No wheezing, rhonchi or rales.   Abdominal:     Palpations: Abdomen is soft.     Tenderness: There is no abdominal tenderness.  Musculoskeletal:     Right ankle: No swelling.     Left ankle: No swelling.  Skin:    General: Skin is warm.     Findings: No rash.  Neurological:     Mental Status: She is alert and oriented to person, place, and time.       Data Reviewed: Basic Metabolic Panel: Recent Labs  Lab 09/08/20 0948 09/09/20 0719  NA 131* 134*  K 4.2 4.2  CL 97* 107  CO2 25 21*  GLUCOSE 569* 278*  BUN 29* 31*  CREATININE 1.71* 1.72*  CALCIUM 8.6* 8.3*  MG 1.5* 1.7   Liver Function Tests: Recent Labs  Lab 09/08/20 0948  AST 28  ALT 18  ALKPHOS 86  BILITOT 1.3*  PROT 6.7  ALBUMIN 3.4*   CBC: Recent Labs  Lab 09/08/20 0948 09/09/20 0719  WBC 11.9* 11.9*  NEUTROABS 9.6*  --   HGB 12.8 12.0  HCT 36.5 34.5*  MCV 96.1 95.6  PLT 223 205    CBG: Recent Labs  Lab 09/08/20 0942 09/08/20 1703 09/08/20 2104 09/08/20 2328 09/09/20 0851  GLUCAP 555* 416* 171* 175* 418*    Recent Results (from the past 240 hour(s))  Resp Panel by RT-PCR (Flu A&B, Covid) Nasopharyngeal  Swab     Status: None   Collection Time: 09/08/20 10:55 AM   Specimen: Nasopharyngeal Swab; Nasopharyngeal(NP) swabs in vial transport medium  Result Value Ref Range Status   SARS Coronavirus 2 by RT PCR NEGATIVE NEGATIVE Final    Comment: (NOTE) SARS-CoV-2 target nucleic acids are NOT DETECTED.  The SARS-CoV-2 RNA is generally detectable in upper respiratory specimens during the acute phase of infection. The lowest concentration of SARS-CoV-2 viral copies this assay can detect is 138 copies/mL. A negative result does not preclude SARS-Cov-2 infection and should not be used as the sole basis for treatment or other patient management decisions. A negative result may occur with  improper specimen collection/handling, submission of specimen other than nasopharyngeal swab, presence of viral mutation(s) within  the areas targeted by this assay, and inadequate number of viral copies(<138 copies/mL). A negative result must be combined with clinical observations, patient history, and epidemiological information. The expected result is Negative.  Fact Sheet for Patients:  EntrepreneurPulse.com.au  Fact Sheet for Healthcare Providers:  IncredibleEmployment.be  This test is no t yet approved or cleared by the [place] FDA and  has been authorized for detection and/or diagnosis of SARS-CoV-2 by FDA under an Emergency Use Authorization (EUA). This EUA will remain  in effect (meaning this test can be used) for the duration of the COVID-19 declaration under Section 564(b)(1) of the Act, 21 U.S.C.section 360bbb-3(b)(1), unless the authorization is terminated  or revoked sooner.       Influenza A by PCR NEGATIVE NEGATIVE Final   Influenza B by PCR NEGATIVE NEGATIVE Final    Comment: (NOTE) The Xpert Xpress SARS-CoV-2/FLU/RSV plus assay is intended as an aid in the diagnosis of influenza from Nasopharyngeal swab specimens and should not be used as a sole basis for treatment. Nasal washings and aspirates are unacceptable for Xpert Xpress SARS-CoV-2/FLU/RSV testing.  Fact Sheet for Patients: EntrepreneurPulse.com.au  Fact Sheet for Healthcare Providers: IncredibleEmployment.be  This test is not yet approved or cleared by the [place] FDA and has been authorized for detection and/or diagnosis of SARS-CoV-2 by FDA under an Emergency Use Authorization (EUA). This EUA will remain in effect (meaning this test can be used) for the duration of the COVID-19 declaration under Section 564(b)(1) of the Act, 21 U.S.C. section 360bbb-3(b)(1), unless the authorization is terminated or revoked.  Performed at [facility], [address], [place], [place] [address]      Studies: DG Chest 2 View  Result Date:  09/08/2020 CLINICAL DATA:  Chest pain. EXAM: CHEST - 2 VIEW COMPARISON:  Chest radiograph 11/07/2019 FINDINGS: Stable enlarged cardiac silhouette. Calcified aortic arch. Large hiatal hernia noted. No effusion, infiltrate or pneumothorax. Degenerative osteophytosis of the spine. IMPRESSION: 1. No acute cardiopulmonary findings. 2. Stable cardiomegaly. 3. Large hiatal hernia. 4.  Aortic Atherosclerosis (ICD10-I70.0). Electronically Signed   By: Suzy Bouchard M.D.   On: 09/08/2020 10:32    Scheduled Meds: . metoprolol tartrate      . allopurinol  100 mg Oral Daily  . aspirin EC  81 mg Oral Daily  . clopidogrel  75 mg Oral Daily  . donepezil  5 mg Oral QPM  . irbesartan  300 mg Oral Daily   And  . hydrochlorothiazide  12.5 mg Oral Daily  . insulin aspart  0-15 Units Subcutaneous TID WC  . insulin aspart  3 Units Subcutaneous TID WC  . insulin glargine  15 Units Subcutaneous Daily  . isosorbide mononitrate  30 mg Oral Daily  .  levothyroxine  50 mcg Oral Q0600  . metoprolol tartrate  5 mg Intravenous Once  . metoprolol tartrate  50 mg Oral BID  . pantoprazole  40 mg Oral Daily  . simvastatin  20 mg Oral QPM  . [START ON 09/14/2020] Vitamin D (Ergocalciferol)  50,000 Units Oral Q Sat   Continuous Infusions: . heparin 1,050 Units/hr (09/09/20 0841)   Brief history.  Patient admitted 09/08/2020 with chest pain.  She is a 87 year old female that has type 2 diabetes, chronic kidney disease stage IV, paroxysmal atrial fibrillation, hypothyroidism, essential hypertension.  Her troponin went above 3000.  Patient started on heparin drip aspirin and Plavix.  Cardiology decided medical management.  Patient went into SVT on 09/09/2020 requiring numerous medications pushed before she broke to sinus rhythm.  Assessment/Plan:  1. Supraventricular tachycardia.  Nursing staff called me back into the room to be present as we push 6 mg of adenosine and then 12 mg of adenosine.  She almost converted to atrial  fibrillation with the 12 mg of adenosine but went right back into SVT.  We push 5 mg of metoprolol and gave oral metoprolol.  Patient's heart rate did come down to about 120.  Later after pushing Cardizem the patient converted over to normal sinus rhythm.  Continue oral metoprolol and Cardizem short acting.  We will get VQ scan to rule out PE.  Patient has a history of paroxysmal atrial fibrillation.  Not on long-term anticoagulation with history of GI bleed.  Critical care provided at the bedside with pushing IV medications for SVT 2. NSTEMI.  Medical management secondary to age and chronic kidney disease.  Continue aspirin Plavix and heparin drip the rest of today.  Patient on metoprolol and simvastatin.  Echocardiogram pending 3. Type 2 diabetes mellitus with hyperlipidemia with very elevated hemoglobin A1c.  Start Lantus insulin.  Continue Zocor. 4. Hypomagnesemia give IV magnesium x1 5. Essential hypertension.  Get rid of hydrochlorothiazide and continue irbesartan and metoprolol.  Short acting Cardizem CD added 6. Hypothyroidism unspecified on Synthroid 7. Hyponatremia.  Sodium up to 134 today 8. Chronic kidney disease stage IV.  Continue to monitor 9. Hypomagnesemia give IV magnesium today        Code Status:     Code Status Orders  (From admission, onward)         Start     Ordered   09/08/20 1304  Do not attempt resuscitation (DNR)  Continuous       Question Answer Comment  In the event of cardiac or respiratory ARREST Do not call a "code blue"   In the event of cardiac or respiratory ARREST Do not perform Intubation, CPR, defibrillation or ACLS   In the event of cardiac or respiratory ARREST Use medication by any route, position, wound care, and other measures to relive pain and suffering. May use oxygen, suction and manual treatment of airway obstruction as needed for comfort.   Comments CODE STATUS was discussed with patient's son Edd Arbour Pickler who was at the bedside and she is  a DO NOT RESUSCITATE      09/08/20 1305        Code Status History    Date Active Date Inactive Code Status Order ID Comments User Context   08/30/2019 1000 08/31/2019 2334 Partial Code [id number]  Ivor Costa, MD ED   06/10/2018 2209 06/10/2018 2209 Full Code [id number]  Gorden Harms, MD ED   06/10/2018 2209 06/10/2018 2209 Partial Code [id number]  Salary, Avel Peace,  MD ED   12/19/2015 2332 12/20/2015 1844 Full Code [id number]  Lance Coon, MD Inpatient   Advance Care Planning Activity     Family Communication: Spoke with daughter-in-law at the bedside while we were getting ready to push medications for SVT Disposition Plan: Status is: Inpatient  Dispo: The patient is from: Home              Anticipated d/c is to: Home              Patient currently on IV heparin and medical management for NSTEMI.  Had SVT today requiring pushing of med numerous medications   Difficult to place patient.  No.  Consultants:  Cardiology  Time spent: 38 minutes, critical care time  [employer]

## 2020-09-09 NOTE — Progress Notes (Signed)
[facility] Cardiology  SUBJECTIVE: Patient laying in bed, denies chest pain or shortness of breath   Vitals:   09/08/20 1806 09/08/20 2103 09/09/20 0358 09/09/20 0831  BP: 138/64 131/63 (!) 137/54 (!) 154/63  Pulse: 77 75 86 96  Resp: 16 17 18 19   Temp: 98.3 F (36.8 C) 98.6 F (37 C) 98.9 F (37.2 C) 98.2 F (36.8 C)  TempSrc: Oral   Oral  SpO2: 98% 96% 96% 95%  Weight:      Height:         Intake/Output Summary (Last 24 hours) at 09/09/2020 0840 Last data filed at 09/09/2020 3536 Gross per 24 hour  Intake 1065.94 ml  Output --  Net 1065.94 ml      PHYSICAL EXAM  General: Well developed, well nourished, in no acute distress HEENT:  Normocephalic and atramatic Neck:  No JVD.  Lungs: Clear bilaterally to auscultation and percussion. Heart: HRRR . Normal S1 and S2 without gallops or murmurs.  Abdomen: Bowel sounds are positive, abdomen soft and non-tender  Msk:  Back normal, normal gait. Normal strength and tone for age. Extremities: No clubbing, cyanosis or edema.   Neuro: Alert and oriented X 3. Psych:  Good affect, responds appropriately   LABS: Basic Metabolic Panel: Recent Labs    09/08/20 0948 09/09/20 0719  NA 131* 134*  K 4.2 4.2  CL 97* 107  CO2 25 21*  GLUCOSE 569* 278*  BUN 29* 31*  CREATININE 1.71* 1.72*  CALCIUM 8.6* 8.3*  MG 1.5*  --    Liver Function Tests: Recent Labs    09/08/20 0948  AST 28  ALT 18  ALKPHOS 86  BILITOT 1.3*  PROT 6.7  ALBUMIN 3.4*   No results for input(s): LIPASE, AMYLASE in the last 72 hours. CBC: Recent Labs    09/08/20 0948 09/09/20 0719  WBC 11.9* 11.9*  NEUTROABS 9.6*  --   HGB 12.8 12.0  HCT 36.5 34.5*  MCV 96.1 95.6  PLT 223 205   Cardiac Enzymes: No results for input(s): CKTOTAL, CKMB, CKMBINDEX, TROPONINI in the last 72 hours. BNP: Invalid input(s): POCBNP D-Dimer: No results for input(s): DDIMER in the last 72 hours. Hemoglobin A1C: Recent Labs    09/08/20 1452  HGBA1C 13.3*   Fasting  Lipid Panel: No results for input(s): CHOL, HDL, LDLCALC, TRIG, CHOLHDL, LDLDIRECT in the last 72 hours. Thyroid Function Tests: No results for input(s): TSH, T4TOTAL, T3FREE, THYROIDAB in the last 72 hours.  Invalid input(s): FREET3 Anemia Panel: No results for input(s): VITAMINB12, FOLATE, FERRITIN, TIBC, IRON, RETICCTPCT in the last 72 hours.  DG Chest 2 View  Result Date: 09/08/2020 CLINICAL DATA:  Chest pain. EXAM: CHEST - 2 VIEW COMPARISON:  Chest radiograph 11/07/2019 FINDINGS: Stable enlarged cardiac silhouette. Calcified aortic arch. Large hiatal hernia noted. No effusion, infiltrate or pneumothorax. Degenerative osteophytosis of the spine. IMPRESSION: 1. No acute cardiopulmonary findings. 2. Stable cardiomegaly. 3. Large hiatal hernia. 4.  Aortic Atherosclerosis (ICD10-I70.0). Electronically Signed   By: Suzy Bouchard M.D.   On: 09/08/2020 10:32     Echo pending  TELEMETRY: Sinus rhythm:  ASSESSMENT AND PLAN:  Principal Problem:   NSTEMI (non-ST elevated myocardial infarction) ([place]) Active Problems:   A-fib (HCC)   Benign essential HTN   Adult hypothyroidism   CKD stage 4 due to type 2 diabetes mellitus ([place])   Hyponatremia    1. Non-ST elevation myocardial infarction, elevated high-sensitivity troponin (2984 and 3669), nondiagnostic ECG, currently chest pain-free on heparin infusion  2.  Paroxysmal atrial fibrillation, CHA2DS2-VASc score 5, not on chronic anticoagulation due to advanced age and falling risk, currently in sinus rhythm 3.  Chronic kidney disease stage IV, high risk for contrast-induced nephrotoxicity 4.  Essential hypertension, systolic blood pressure mildly elevated 5.  Hyperlipidemia, on simvastatin  Recommendations  1.  Agree with current therapy 2.  Continue heparin infusion for 48 hours (DC in a.m. 09/10/2020) 3.  Continue Plavix 75 mg daily 4.  Continue isosorbide mononitrate 30 mg daily 5.  Review 2D echocardiogram 6.  Defer cardiac  catheterization at this time.  Following discussion with patient and patient's son, we agreed initially to pursue conservative management in light of patient's advanced age and high risk for contrast-induced nephrotoxicity.   Isaias Cowman, MD, PhD, [facility] 09/09/2020 8:40 AM

## 2020-09-09 NOTE — Progress Notes (Signed)
Inpatient Diabetes Program Recommendations  AACE/ADA: New Consensus Statement on Inpatient Glycemic Control   Target Ranges:  Prepandial:   less than 140 mg/dL      Peak postprandial:   less than 180 mg/dL (1-2 hours)      Critically ill patients:  140 - 180 mg/dL  Results for CAIRA, POCHE (MRN [id number]) as of 09/09/2020 08:12  Ref. Range 09/09/2020 07:19  Glucose Latest Ref Range: 70 - 99 mg/dL 278 (H)   Results for Mckeag, VOLANDA MANGINE (MRN [id number]) as of 09/09/2020 08:12  Ref. Range 09/08/2020 09:42 09/08/2020 17:03 09/08/2020 21:04 09/08/2020 23:28  Glucose-Capillary Latest Ref Range: 70 - 99 mg/dL 555 (HH) 416 (H) 171 (H) 175 (H)   Results for FAYLINN, SCHWENN (MRN [id number]) as of 09/09/2020 08:12  Ref. Range 09/08/2020 09:48 09/08/2020 14:52  Glucose Latest Ref Range: 70 - 99 mg/dL 569 (HH)   Hemoglobin A1C Latest Ref Range: 4.8 - 5.6 %  13.3 (H)   Review of Glycemic Control  Diabetes history: DM2 Outpatient Diabetes medications: Glipizide XL 5 mg BID, Actos 30 mg daily, Tradjenta 5 mg daily, Metformin 500 mg BID Current orders for Inpatient glycemic control: Novolog 0-15 units TID with meals  Inpatient Diabetes Program Recommendations:    Insulin: Please consider ordering Lantus 6 units Q24H.  HbgA1C: A1C 13.3 % on 09/08/20 indicating an average glucose of 335 mg/dl over the past 2-3 months. Prior A1C in Care Everywhere was 8.5% on 04/17/20.  NOTE: In reviewing chart, noted patient received steroid injection in knee on 06/26/20 which may be impacting current A1C results. Will plan to speak with patient today.  Addendum 09/09/20@13 :40-Spoke with patient and her daughter in law at bedside. Patient states that she is taking DM medications as prescribed and noted above. Patient states she checks glucose once a day and varies time (sometimes in the morning and sometimes in the evening). Patient states that her glucose is okay. Patient's daughter in law states that patient lives by herself and her son goes  by at least once a day to check on patient. She notes that son (her husband) has noticed that patient has not been taking pills consistently (pills in pill box and sometimes she forgets which seems to be happening more often lately). Patient and her daughter in law confirm that patient got a steroid injection in her knee in January. Explained impact of steroids on glucose control. Patient's daughter in law reports that when she has looked at patient's glucose log at home, the numbers are sometimes in the 200's mg/dl.  Discussed current A1C of 13.3% on 09/08/20 indicating an average glucose of 335 mg/dl. Patient states that her glucose does not run in the 300's. Explained that current A1C is much higher than prior A1C of 8.5% on 04/17/20 noted in chart. Patient states that she has not done anything different and is unsure why it is so much higher. Patient's daughter in law notes that it may be that patient is not consistently taking medications as she is forgetting a lot lately. Encouraged patient's daughter in law to ask patient's PCP about possibly changing frequency of Glipizide and Metformin to once a day at increased dose if appropriate so she is only taking DM medications once a day.  Also encouraged patient to let her family help remind her about taking her medicine,such as she could take the medicine when her son comes by once a day or perhaps they could call her once a day and she take  the medicine while on the phone with them. Encouraged patient to be sure to follow up with her PCP regarding DM control. Explained that given her age, her glucose goals and A1C are likely higher than ADA guidelines to decrease risk of hypoglycemia which could lead to other acute complications.  Patient and daughter in law verbalized understanding of information and state they have no questions at this time.  Thanks, Barnie Alderman, RN, MSN, CDE Diabetes Coordinator Inpatient Diabetes Program [phone] (Team Pager from 8am  to 5pm)

## 2020-09-09 NOTE — Consult Note (Signed)
ANTICOAGULATION CONSULT NOTE - Follow Up Consult  Pharmacy Consult for Heparin Indication: chest pain/ACS  Allergies  Allergen Reactions  . Oxycodone Hcl Other (See Comments)    Other Reaction: GI Upset Other reaction(s): Other (See Comments) Other Reaction: GI Upset   . Oxycodone-Aspirin Other (See Comments)    Other Reaction: GI Upset (Per pt she does take aspirin 81 mg daily  . Augmentin [Amoxicillin-Pot Clavulanate] Nausea Only  . Codeine Nausea Only  . Etodolac Other (See Comments)  . Iodine Nausea Only  . Levaquin [Levofloxacin In D5w] Nausea Only  . Levofloxacin Nausea Only  . Metronidazole Nausea Only  . Ultram [Tramadol] Nausea Only  . Sulfa Antibiotics Nausea Only and Other (See Comments)    Other reaction(s): Unknown     Patient Measurements: Height: 5\' 5"  (165.1 cm) Weight: 69.2 kg (152 lb 8 oz) IBW/kg (Calculated) : 57 Heparin Dosing Weight: 63.5 kg   Vital Signs: Temp: 98 F (36.7 C) (04/04 1558) Temp Source: Oral (04/04 1558) BP: 107/53 (04/04 1558) Pulse Rate: 74 (04/04 1558)  Labs: Recent Labs    09/08/20 0948 09/08/20 1055 09/08/20 1146 09/08/20 1452 09/08/20 1735 09/08/20 2030 09/09/20 0719 09/09/20 1721  HGB 12.8  --   --   --   --   --  12.0  --   HCT 36.5  --   --   --   --   --  34.5*  --   PLT 223  --   --   --   --   --  205  --   APTT  --  35  --   --   --   --   --   --   LABPROT  --  12.9  --   --   --   --   --   --   INR  --  1.0  --   --   --   --   --   --   HEPARINUNFRC  --   --   --   --   --  0.28* 0.28* 0.79*  CREATININE 1.71*  --   --   --   --   --  1.72*  --   TROPONINIHS 2,984*  --  3,629* 3,629* 3,175*  --   --   --     Estimated Creatinine Clearance: 19.5 mL/min (A) (by C-G formula based on SCr of 1.72 mg/dL (H)).   Medications:  Medications Prior to Admission  Medication Sig Dispense Refill Last Dose  . allopurinol (ZYLOPRIM) 100 MG tablet Take 100 mg by mouth daily.   09/08/2020 at AM  . donepezil (ARICEPT)  5 MG tablet Take 1 tablet by mouth every evening.   09/07/2020 at PM  . ferrous sulfate 325 (65 FE) MG EC tablet Take 1 tablet (325 mg total) by mouth 2 (two) times daily. 60 tablet 3 09/07/2020 at PM  . glipiZIDE (GLUCOTROL XL) 5 MG 24 hr tablet Take 5 mg by mouth 2 (two) times daily.    09/07/2020 at PM  . irbesartan-hydrochlorothiazide (AVALIDE) 300-12.5 MG tablet Take 1 tablet by mouth daily.    09/08/2020 at AM  . levothyroxine (SYNTHROID, LEVOTHROID) 50 MCG tablet Take 50 mcg by mouth daily.    09/08/2020 at AM  . metoprolol tartrate (LOPRESSOR) 25 MG tablet Take 25 mg by mouth 2 (two) times daily.   09/08/2020 at AM  . pantoprazole (PROTONIX) 40 MG tablet Take 1 tablet (40 mg  total) by mouth daily. 30 tablet 1 09/08/2020 at AM  . pioglitazone (ACTOS) 30 MG tablet Take 30 mg by mouth daily.   09/08/2020 at AM  . simvastatin (ZOCOR) 20 MG tablet Take 20 mg by mouth every evening.    09/07/2020 at PM  . TRADJENTA 5 MG TABS tablet Take 5 mg by mouth daily.    09/08/2020 at AM  . Vitamin D, Ergocalciferol, (DRISDOL) 1.25 MG (50000 UT) CAPS capsule Take 50,000 Units by mouth every Saturday.    09/07/2020 at PM  . cyanocobalamin (,VITAMIN B-12,) 1000 MCG/ML injection Inject 1,000 mcg into the muscle every 30 (thirty) days.       Scheduled:  . allopurinol  100 mg Oral Daily  . aspirin EC  81 mg Oral Daily  . clopidogrel  75 mg Oral Daily  . diltiazem  120 mg Oral Daily  . donepezil  5 mg Oral QPM  . insulin aspart  0-15 Units Subcutaneous TID WC  . insulin aspart  2 Units Subcutaneous TID WC  . [START ON 09/10/2020] insulin glargine  6 Units Subcutaneous Daily  . isosorbide mononitrate  30 mg Oral Daily  . levothyroxine  50 mcg Oral Q0600  . metoprolol tartrate      . metoprolol tartrate  50 mg Oral BID  . pantoprazole  40 mg Oral Daily  . simvastatin  20 mg Oral QPM  . [START ON 09/14/2020] Vitamin D (Ergocalciferol)  50,000 Units Oral Q Sat   Infusions:  . heparin 1,050 Units/hr (09/09/20 0841)   PRN:  acetaminophen, metoprolol tartrate, nitroGLYCERIN, ondansetron (ZOFRAN) IV Anti-infectives (From admission, onward)   None      Assessment: 87yo female who presented to ED for chest pain. Pt has history of HTN, DM on multiple oral agents, hypothyroidism. Previous paroxysmal A. fib for which she follows with cardiology, and not on anticoagulation due to advance age and fall risk. Trop HS 2984; baseline H&H and Plt WNL. Pharmacy has been consulted for heparin dosing and monitoring.   4/3 2030 HL 0.28 increased infusion to 1050 units/hr.  4/4 1721 HL 0.79 decrease to 950 units/hr  Goal of Therapy:  Heparin level 0.3-0.7 units/ml Monitor platelets by anticoagulation protocol: Yes   Plan:  4/4 1721 HL= 0.79.  supratherapeutic. Will decrease heparin infusion to 950 units/hr. Recheck heparin level in 8 hour. CBC daily while on heparin. Plan is to continue heparin for 48 hours per cards.   Shakena Callari A, PharmD  09/09/2020,5:59 PM

## 2020-09-10 ENCOUNTER — Inpatient Hospital Stay: Payer: Medicare HMO

## 2020-09-10 ENCOUNTER — Inpatient Hospital Stay
Admit: 2020-09-10 | Discharge: 2020-09-10 | Disposition: A | Discharge status: Home / Self Care | Payer: Medicare HMO | Attending: Internal Medicine | Admitting: Internal Medicine

## 2020-09-10 DIAGNOSIS — E039 Hypothyroidism, unspecified: Secondary | ICD-10-CM

## 2020-09-10 DIAGNOSIS — N189 Chronic kidney disease, unspecified: Secondary | ICD-10-CM

## 2020-09-10 DIAGNOSIS — N179 Acute kidney failure, unspecified: Secondary | ICD-10-CM

## 2020-09-10 LAB — CBC
HCT: 33.9 % — ABNORMAL LOW (ref 36.0–46.0)
Hemoglobin: 11.6 g/dL — ABNORMAL LOW (ref 12.0–15.0)
MCH: 33 pg (ref 26.0–34.0)
MCHC: 34.2 g/dL (ref 30.0–36.0)
MCV: 96.6 fL (ref 80.0–100.0)
Platelets: 197 10*3/uL (ref 150–400)
RBC: 3.51 MIL/uL — ABNORMAL LOW (ref 3.87–5.11)
RDW: 14.4 % (ref 11.5–15.5)
WBC: 14 10*3/uL — ABNORMAL HIGH (ref 4.0–10.5)
nRBC: 0 % (ref 0.0–0.2)

## 2020-09-10 LAB — BASIC METABOLIC PANEL
Anion gap: 7 (ref 5–15)
BUN: 35 mg/dL — ABNORMAL HIGH (ref 8–23)
CO2: 21 mmol/L — ABNORMAL LOW (ref 22–32)
Calcium: 8.3 mg/dL — ABNORMAL LOW (ref 8.9–10.3)
Chloride: 104 mmol/L (ref 98–111)
Creatinine, Ser: 2.09 mg/dL — ABNORMAL HIGH (ref 0.44–1.00)
GFR, Estimated: 22 mL/min — ABNORMAL LOW (ref >60)
Glucose, Bld: 151 mg/dL — ABNORMAL HIGH (ref 70–99)
Potassium: 4.5 mmol/L (ref 3.5–5.1)
Sodium: 132 mmol/L — ABNORMAL LOW (ref 135–145)

## 2020-09-10 LAB — LIPID PANEL
Cholesterol: 163 mg/dL (ref 0–200)
HDL: 49 mg/dL (ref >40)
LDL Cholesterol: 91 mg/dL (ref 0–99)
Total CHOL/HDL Ratio: 3.3 RATIO
Triglycerides: 117 mg/dL (ref <150)
VLDL: 23 mg/dL (ref 0–40)

## 2020-09-10 LAB — GLUCOSE, CAPILLARY
Glucose-Capillary: 187 mg/dL — ABNORMAL HIGH (ref 70–99)
Glucose-Capillary: 201 mg/dL — ABNORMAL HIGH (ref 70–99)

## 2020-09-10 LAB — HEPARIN LEVEL (UNFRACTIONATED)
Heparin Unfractionated: 0.5 IU/mL (ref 0.30–0.70)
Heparin Unfractionated: 0.11 IU/mL — ABNORMAL LOW (ref 0.30–0.70)

## 2020-09-10 LAB — MAGNESIUM: Magnesium: 2.3 mg/dL (ref 1.7–2.4)

## 2020-09-10 MED ORDER — ISOSORBIDE MONONITRATE ER 30 MG PO TB24: 30.0000 mg | ORAL_TABLET | Freq: Every day | ORAL | 0 refills | Status: DC

## 2020-09-10 MED ORDER — DILTIAZEM HCL ER COATED BEADS 120 MG PO CP24: 120.0000 mg | ORAL_CAPSULE | Freq: Every day | ORAL | 0 refills | Status: DC

## 2020-09-10 MED ORDER — ASPIRIN 81 MG PO TBEC: 81.0000 mg | DELAYED_RELEASE_TABLET | Freq: Every day | ORAL | 0 refills | Status: DC

## 2020-09-10 MED ORDER — TECHNETIUM TO 99M ALBUMIN AGGREGATED
4.1000 | Freq: Once | INTRAVENOUS | Status: AC | PRN
  Administered 2020-09-10: 4.1 via INTRAVENOUS

## 2020-09-10 MED ORDER — CLOPIDOGREL BISULFATE 75 MG PO TABS: 75.0000 mg | ORAL_TABLET | Freq: Every day | ORAL | 0 refills | Status: DC

## 2020-09-10 MED ORDER — NITROGLYCERIN 0.4 MG SL SUBL: 0.4000 mg | SUBLINGUAL_TABLET | SUBLINGUAL | 0 refills | Status: DC | PRN

## 2020-09-10 MED ORDER — ATORVASTATIN CALCIUM 40 MG PO TABS: 40.0000 mg | ORAL_TABLET | Freq: Every day | ORAL | 0 refills | Status: DC

## 2020-09-10 MED ORDER — SODIUM CHLORIDE 0.9 % IV BOLUS
250.0000 mL | Freq: Once | INTRAVENOUS | Status: AC
  Administered 2020-09-10: 250 mL via INTRAVENOUS

## 2020-09-10 MED ORDER — METOPROLOL TARTRATE 50 MG PO TABS
50.0000 mg | ORAL_TABLET | Freq: Two times a day (BID) | ORAL | 0 refills | Status: DC
Start: 2020-09-10 — End: 2023-03-26

## 2020-09-10 NOTE — TOC Transition Note (Signed)
Transition of Care [facility]) - CM/SW Discharge Note   Patient Details  Name: Erika Ochoa MRN: [id number] Date of Birth: Apr 20, 1925  [facility] CM/SW Contact:  Eileen Stanford, LCSW Phone Number: 09/10/2020, 12:37 PM   Clinical Narrative:   Pt is only alert to self. CSW spoke with pt's son via telephone. Pt's son states pt lives alone. Pt's son did not have a [employer] agency preference. Pt's son states pt has a walker at home but will never use it. CSW will reach out to [facility] agencies to see who can service pt with given insurance.      Barriers to Discharge: Continued Medical Work up   Patient Goals and CMS Choice Patient states their goals for this hospitalization and ongoing recovery are:: to get pt home   Choice offered to / list presented to : Adult Children  Discharge Placement                       Discharge Plan and Services In-house Referral: NA   Post Acute Care Choice: Home Health                    HH Arranged: PT          Social Determinants of Health (SDOH) Interventions     Readmission Risk Interventions No flowsheet data found.

## 2020-09-10 NOTE — Discharge Summary (Signed)
[facility] NAME: Erika Ochoa    MR#:  [id number]  DATE OF BIRTH:  1924-10-15  DATE OF ADMISSION:  09/08/2020 ADMITTING PHYSICIAN: Collier Bullock, MD  DATE OF DISCHARGE: 09/10/2020  3:24 PM  PRIMARY CARE PHYSICIAN: Maryland Pink, MD    ADMISSION DIAGNOSIS:  Other chest pain [R07.89] Hyperglycemia [R73.9] NSTEMI (non-ST elevated myocardial infarction) ([facility]) [I21.4]  DISCHARGE DIAGNOSIS:  Principal Problem:   NSTEMI (non-ST elevated myocardial infarction) ([name]) Active Problems:   SVT (supraventricular tachycardia) (H[facility])   Benign essential HTN   Type 2 diabetes mellitus with hyperlipidemia ([place])   Essential hypertension   Adult hypothyroidism   CKD stage 4 due to type 2 diabetes mellitus ([place])   Hyponatremia   Hypomagnesemia   SECONDARY DIAGNOSIS:   Past Medical History:  Diagnosis Date  . Abnormal mammogram, unspecified   . Actinic keratosis   . Breast screening, unspecified   . Diabetes (Erika Ochoa)   . Hypertension   . Mammographic microcalcification   . Squamous cell carcinoma of skin 06/14/2019   Left superior forehead. Well differentiated. Tx: EDC  . Squamous cell carcinoma of skin 09/13/2019   L of midline med forehead at hairline SCCIS   . Vaginal pessary present     HOSPITAL COURSE:   1.  NSTEMI.  Medical management secondary to age and chronic kidney disease.  The patient was placed on aspirin, Plavix and metoprolol.  Simvastatin was switched over to atorvastatin.  Imdur also added.  Patient was on heparin drip for 2 days.  Echocardiogram showed evidence of hyper trophic cardiomyopathy with an EF of 65 to 75%, grade 3 diastolic dysfunction mild to moderate mitral valve regurgitation.  Cardiology will follow up as outpatient.  Troponin peaked at 3629. 2.  Supraventricular tachycardia.  Yesterday had an episode of SVT required need pushing adenosine 6 mg then 12 mg without response.  We pushed metoprolol also and  heart rate did come down.  After pushing Cardizem the patient did convert over to normal sinus rhythm.  Patient is now on oral Cardizem CD and metoprolol twice daily.  Heart rate in sinus rhythm and controlled.  VQ scan was negative for pulmonary embolism. 3.  Type 2 diabetes mellitus with hyperlipidemia.  Patient has a very elevated hemoglobin A1c at 13.3.  We did give Lantus insulin here.  Patient's hearing aid battery has died and she could not hear me very well.  I was unable to talk about insulin with her.  When I did speak with insulin to her son,  he did not want to do insulin at this time.  Can go back on oral medications with close follow-up with her medical doctor as outpatient.  Continue atorvastatin 4.  Hypomagnesemia the patient was given magnesium IV during the hospital course. 5.  Essential hypertension.  I did get rid of irbesartan hydrochlorothiazide because blood pressure little bit low when the patient was in supraventricular tachycardia.  Currently on metoprolol and Cardizem CD. 6.  Hypothyroidism unspecified on Synthroid 7.  Hyponatremia initially.  Sodium 131 on presentation came up to 134 and upon discharge 132. 8.  Acute kidney injury on chronic kidney disease stage IV.  I did have to give a fluid bolus back creatinine did creep up a little bit to 2.09.  Recommend checking a BMP as outpatient.  Holding irbesartan hydrochlorothiazide.   DISCHARGE CONDITIONS:   Satisfactory  CONSULTS OBTAINED:  Treatment Team:  Isaias Cowman, MD  DRUG ALLERGIES:  Allergies  Allergen Reactions  . Oxycodone Hcl Other (See Comments)    Other Reaction: GI Upset Other reaction(s): Other (See Comments) Other Reaction: GI Upset   . Oxycodone-Aspirin Other (See Comments)    Other Reaction: GI Upset (Per pt she does take aspirin 81 mg daily  . Augmentin [Amoxicillin-Pot Clavulanate] Nausea Only  . Codeine Nausea Only  . Etodolac Other (See Comments)  . Iodine Nausea Only  .  Levaquin [Levofloxacin In D5w] Nausea Only  . Levofloxacin Nausea Only  . Metronidazole Nausea Only  . Ultram [Tramadol] Nausea Only  . Sulfa Antibiotics Nausea Only and Other (See Comments)    Other reaction(s): Unknown     DISCHARGE MEDICATIONS:   Allergies as of 09/10/2020      Reactions   Oxycodone Hcl Other (See Comments)   Other Reaction: GI Upset Other reaction(s): Other (See Comments) Other Reaction: GI Upset   Oxycodone-aspirin Other (See Comments)   Other Reaction: GI Upset (Per pt she does take aspirin 81 mg daily   Augmentin [amoxicillin-pot Clavulanate] Nausea Only   Codeine Nausea Only   Etodolac Other (See Comments)   Iodine Nausea Only   Levaquin [levofloxacin In D5w] Nausea Only   Levofloxacin Nausea Only   Metronidazole Nausea Only   Ultram [tramadol] Nausea Only   Sulfa Antibiotics Nausea Only, Other (See Comments)   Other reaction(s): Unknown      Medication List    STOP taking these medications   irbesartan-hydrochlorothiazide 300-12.5 MG tablet Commonly known as: AVALIDE   simvastatin 20 MG tablet Commonly known as: ZOCOR     TAKE these medications   allopurinol 100 MG tablet Commonly known as: ZYLOPRIM Take 100 mg by mouth daily.   aspirin 81 MG EC tablet Take 1 tablet (81 mg total) by mouth daily. Swallow whole. Start taking on: September 11, 2020   atorvastatin 40 MG tablet Commonly known as: Lipitor Take 1 tablet (40 mg total) by mouth daily.   clopidogrel 75 MG tablet Commonly known as: PLAVIX Take 1 tablet (75 mg total) by mouth daily. Start taking on: September 11, 2020   cyanocobalamin 1000 MCG/ML injection Commonly known as: (VITAMIN B-12) Inject 1,000 mcg into the muscle every 30 (thirty) days.   diltiazem 120 MG 24 hr capsule Commonly known as: CARDIZEM CD Take 1 capsule (120 mg total) by mouth daily. Start taking on: September 11, 2020   donepezil 5 MG tablet Commonly known as: ARICEPT Take 1 tablet by mouth every evening.    ferrous sulfate 325 (65 FE) MG EC tablet Take 1 tablet (325 mg total) by mouth 2 (two) times daily.   glipiZIDE 5 MG 24 hr tablet Commonly known as: GLUCOTROL XL Take 5 mg by mouth 2 (two) times daily.   isosorbide mononitrate 30 MG 24 hr tablet Commonly known as: IMDUR Take 1 tablet (30 mg total) by mouth daily. Start taking on: September 11, 2020   levothyroxine 50 MCG tablet Commonly known as: SYNTHROID Take 50 mcg by mouth daily.   metoprolol tartrate 50 MG tablet Commonly known as: LOPRESSOR Take 1 tablet (50 mg total) by mouth 2 (two) times daily. What changed:  medication strength how much to take   nitroGLYCERIN 0.4 MG SL tablet Commonly known as: NITROSTAT Place 1 tablet (0.4 mg total) under the tongue every 5 (five) minutes x 3 doses as needed for chest pain (max 3).   pantoprazole 40 MG tablet Commonly known as: Protonix Take 1 tablet (40 mg total) by mouth  daily.   pioglitazone 30 MG tablet Commonly known as: ACTOS Take 30 mg by mouth daily.   Tradjenta 5 MG Tabs tablet Generic drug: linagliptin Take 5 mg by mouth daily.   Vitamin D (Ergocalciferol) 1.25 MG (50000 UNIT) Caps capsule Commonly known as: DRISDOL Take 50,000 Units by mouth every Saturday.        DISCHARGE INSTRUCTIONS:  Follow-up PMD 5 days  follow-up cardiology 1 week.  If you experience worsening of your admission symptoms, develop shortness of breath, life threatening emergency, suicidal or homicidal thoughts you must seek medical attention immediately by calling 911 or calling your MD immediately  if symptoms less severe.  You Must read complete instructions/literature along with all the possible adverse reactions/side effects for all the Medicines you take and that have been prescribed to you. Take any new Medicines after you have completely understood and accept all the possible adverse reactions/side effects.   Please note  You were cared for by a hospitalist during your hospital  stay. If you have any questions about your discharge medications or the care you received while you were in the hospital after you are discharged, you can call the unit and asked to speak with the hospitalist on call if the hospitalist that took care of you is not available. Once you are discharged, your primary care physician will handle any further medical issues. Please note that NO REFILLS for any discharge medications will be authorized once you are discharged, as it is imperative that you return to your primary care physician (or establish a relationship with a primary care physician if you do not have one) for your aftercare needs so that they can reassess your need for medications and monitor your lab values.    Today   CHIEF COMPLAINT:   Chief Complaint  Patient presents with  . Chest Pain    HISTORY OF PRESENT ILLNESS:  Erika Ochoa  is a 87 y.o. female came in with chest pain and found to have NSTEMI.   VITAL SIGNS:  Blood pressure 105/60, pulse 63, temperature 98.6 F (37 C), temperature source Oral, resp. rate (!) 24, height 5\' 5"  (1.651 m), weight 69 kg, SpO2 95 %.  I/O:    Intake/Output Summary (Last 24 hours) at 09/10/2020 1615 Last data filed at 09/10/2020 1345 Gross per 24 hour  Intake 600 ml  Output 0 ml  Net 600 ml    PHYSICAL EXAMINATION:  GENERAL:  87 y.o.-year-old patient lying in the bed with no acute distress.  EYES: Pupils equal, round, reactive to light and accommodation. No scleral icterus. HEENT: Head atraumatic, normocephalic. Oropharynx and nasopharynx clear.  LUNGS: Normal breath sounds bilaterally, no wheezing, rales,rhonchi or crepitation. No use of accessory muscles of respiration.  CARDIOVASCULAR: S1, S2 normal.  3 out of 6 systolic murmurs.  No rubs, or gallops.  ABDOMEN: Soft, non-tender, non-distended.  EXTREMITIES: No pedal edema.  NEUROLOGIC: Cranial nerves II through XII are intact. Muscle strength 5/5 in all extremities. Sensation intact.  Gait not checked.  PSYCHIATRIC: The patient is alert and oriented x 3.  SKIN: No obvious rash, lesion, or ulcer.   DATA REVIEW:   CBC Recent Labs  Lab 09/10/20 0159  WBC 14.0*  HGB 11.6*  HCT 33.9*  PLT 197    Chemistries  Recent Labs  Lab 09/08/20 0948 09/09/20 0719 09/10/20 0159  NA 131*   < > 132*  K 4.2   < > 4.5  CL 97*   < > 104  CO2 25   < > 21*  GLUCOSE 569*   < > 151*  BUN 29*   < > 35*  CREATININE 1.71*   < > 2.09*  CALCIUM 8.6*   < > 8.3*  MG 1.5*   < > 2.3  AST 28  --   --   ALT 18  --   --   ALKPHOS 86  --   --   BILITOT 1.3*  --   --    < > = values in this interval not displayed.    Microbiology Results  Results for orders placed or performed during the hospital encounter of 09/08/20  Resp Panel by RT-PCR (Flu A&B, Covid) Nasopharyngeal Swab     Status: None   Collection Time: 09/08/20 10:55 AM   Specimen: Nasopharyngeal Swab; Nasopharyngeal(NP) swabs in vial transport medium  Result Value Ref Range Status   SARS Coronavirus 2 by RT PCR NEGATIVE NEGATIVE Final    Comment: (NOTE) SARS-CoV-2 target nucleic acids are NOT DETECTED.  The SARS-CoV-2 RNA is generally detectable in upper respiratory specimens during the acute phase of infection. The lowest concentration of SARS-CoV-2 viral copies this assay can detect is 138 copies/mL. A negative result does not preclude SARS-Cov-2 infection and should not be used as the sole basis for treatment or other patient management decisions. A negative result may occur with  improper specimen collection/handling, submission of specimen other than nasopharyngeal swab, presence of viral mutation(s) within the areas targeted by this assay, and inadequate number of viral copies(<138 copies/mL). A negative result must be combined with clinical observations, patient history, and epidemiological information. The expected result is Negative.  Fact Sheet for Patients:   EntrepreneurPulse.com.au  Fact Sheet for Healthcare Providers:  IncredibleEmployment.be  This test is no t yet approved or cleared by the [place] FDA and  has been authorized for detection and/or diagnosis of SARS-CoV-2 by FDA under an Emergency Use Authorization (EUA). This EUA will remain  in effect (meaning this test can be used) for the duration of the COVID-19 declaration under Section 564(b)(1) of the Act, 21 U.S.C.section 360bbb-3(b)(1), unless the authorization is terminated  or revoked sooner.       Influenza A by PCR NEGATIVE NEGATIVE Final   Influenza B by PCR NEGATIVE NEGATIVE Final    Comment: (NOTE) The Xpert Xpress SARS-CoV-2/FLU/RSV plus assay is intended as an aid in the diagnosis of influenza from Nasopharyngeal swab specimens and should not be used as a sole basis for treatment. Nasal washings and aspirates are unacceptable for Xpert Xpress SARS-CoV-2/FLU/RSV testing.  Fact Sheet for Patients: EntrepreneurPulse.com.au  Fact Sheet for Healthcare Providers: IncredibleEmployment.be  This test is not yet approved or cleared by the [place] FDA and has been authorized for detection and/or diagnosis of SARS-CoV-2 by FDA under an Emergency Use Authorization (EUA). This EUA will remain in effect (meaning this test can be used) for the duration of the COVID-19 declaration under Section 564(b)(1) of the Act, 21 U.S.C. section 360bbb-3(b)(1), unless the authorization is terminated or revoked.  Performed at [facility], [address] [place], [place] [address]     RADIOLOGY:  DG Chest 2 View  Result Date: 09/10/2020 CLINICAL DATA:  Shortness of breath, diabetes mellitus, hypertension EXAM: CHEST - 2 VIEW COMPARISON:  09/08/2020 FINDINGS: Enlargement of cardiac silhouette. Mediastinal contours and pulmonary vascularity normal. Atherosclerotic calcification aorta. Minimal  bibasilar atelectasis, improved. No infiltrate, pleural effusion or pneumothorax. Bones demineralized. IMPRESSION: Enlargement of cardiac silhouette. Minimal bibasilar  atelectasis, improved. Aortic Atherosclerosis (ICD10-I70.0). Electronically Signed   By: Lavonia Dana M.D.   On: 09/10/2020 14:25   NM Pulmonary Perfusion  Result Date: 09/10/2020 CLINICAL DATA:  Chest pain, negative D-dimer, low clinical suspicion of pulmonary embolism EXAM: NUCLEAR MEDICINE PERFUSION LUNG SCAN TECHNIQUE: Perfusion images were obtained in multiple projections after intravenous injection of radiopharmaceutical. Ventilation scans intentionally deferred if perfusion scan and chest x-ray adequate for interpretation during COVID 19 epidemic. RADIOPHARMACEUTICALS:  4.1 mCi Tc-18m MAA IV COMPARISON:  None Correlation: Chest radiograph 09/08/2020 FINDINGS: Normal perfusion lung scan. No perfusion defects. IMPRESSION: Normal perfusion lung scan. Electronically Signed   By: Lavonia Dana M.D.   On: 09/10/2020 11:59   ECHOCARDIOGRAM COMPLETE  Result Date: 09/09/2020    ECHOCARDIOGRAM REPORT   Patient Name:   REDA CITRON Muccio Date of Exam: 09/09/2020 Medical Rec #:  [id number]     Height:       65.0 in Accession #:    [id number]    Weight:       140.0 lb Date of Birth:  04/05/1925     BSA:          1.700 m Patient Age:    87 years      BP:           137/54 mmHg Patient Gender: F             HR:           89 bpm. Exam Location:  [facility] Procedure: 2D Echo, Color Doppler and Cardiac Doppler Indications:     I21.4 NSTEMI  History:         Patient has prior history of Echocardiogram examinations. Risk                  Factors:Hypertension and Diabetes.  Sonographer:     Charmayne Sheer RDCS (AE) Referring Phys:  [id number] Collier Bullock Diagnosing Phys: Yolonda Kida MD  Sonographer Comments: Suboptimal parasternal window. IMPRESSIONS  1. Evidence of HCOM.  2. Left ventricular ejection fraction, by estimation, is 65 to 70%. The left ventricle has  hyperdynamic function. The left ventricle demonstrates global hypokinesis. There is moderate concentric left ventricular hypertrophy. Left ventricular diastolic parameters are consistent with Grade III diastolic dysfunction (restrictive).  3. Right ventricular systolic function is normal. The right ventricular size is normal. Mildly increased right ventricular wall thickness.  4. The mitral valve is normal in structure. Mild to moderate mitral valve regurgitation.  5. Tricuspid valve regurgitation is mild to moderate.  6. The aortic valve is grossly normal. Aortic valve regurgitation is not visualized. Mild aortic valve sclerosis is present, with no evidence of aortic valve stenosis. Conclusion(s)/Recommendation(s): Findings consistent with hypertrophic obstructive cardiomyopathy. FINDINGS  Left Ventricle: Left ventricular ejection fraction, by estimation, is 65 to 70%. The left ventricle has hyperdynamic function. The left ventricle demonstrates global hypokinesis. The left ventricular internal cavity size was small. There is moderate concentric left ventricular hypertrophy. Left ventricular diastolic parameters are consistent with Grade III diastolic dysfunction (restrictive). Right Ventricle: The right ventricular size is normal. Mildly increased right ventricular wall thickness. Right ventricular systolic function is normal. Left Atrium: Left atrial size was normal in size. Right Atrium: Right atrial size was normal in size. Pericardium: There is no evidence of pericardial effusion. Mitral Valve: The mitral valve is normal in structure. Mild to moderate mitral valve regurgitation. MV peak gradient, 9.5 mmHg. The mean mitral valve gradient is 4.0 mmHg. Tricuspid Valve: The tricuspid valve is  grossly normal. Tricuspid valve regurgitation is mild to moderate. Aortic Valve: The aortic valve is grossly normal. Aortic valve regurgitation is not visualized. Mild aortic valve sclerosis is present, with no evidence of  aortic valve stenosis. Aortic valve mean gradient measures 15.0 mmHg. Aortic valve peak gradient measures 23.0 mmHg. Aortic valve area, by VTI measures 2.12 cm. Pulmonic Valve: The pulmonic valve was grossly normal. Pulmonic valve regurgitation is not visualized. Aorta: The ascending aorta was not well visualized. IAS/Shunts: No atrial level shunt detected by color flow Doppler. Additional Comments: Evidence of HCOM.  LEFT VENTRICLE PLAX 2D LVIDd:         3.90 cm  Diastology LVIDs:         3.30 cm  LV e' medial:    5.87 cm/s LV PW:         1.30 cm  LV E/e' medial:  11.0 LV IVS:        1.10 cm  LV e' lateral:   5.66 cm/s LVOT diam:     1.90 cm  LV E/e' lateral: 11.4 LV SV:         113 LV SV Index:   67 LVOT Area:     2.84 cm  LEFT ATRIUM           Index LA diam:      3.80 cm 2.24 cm/m LA Vol (A2C): 37.2 ml 21.88 ml/m LA Vol (A4C): 69.1 ml 40.65 ml/m  AORTIC VALVE                    PULMONIC VALVE AV Area (Vmax):    2.20 cm     PV Vmax:       1.69 m/s AV Area (Vmean):   2.10 cm     PV Vmean:      105.000 cm/s AV Area (VTI):     2.12 cm     PV VTI:        0.251 m AV Vmax:           240.00 cm/s  PV Peak grad:  11.4 mmHg AV Vmean:          185.000 cm/s PV Mean grad:  5.0 mmHg AV VTI:            0.535 m AV Peak Grad:      23.0 mmHg AV Mean Grad:      15.0 mmHg LVOT Vmax:         186.00 cm/s LVOT Vmean:        137.000 cm/s LVOT VTI:          0.400 m LVOT/AV VTI ratio: 0.75  AORTA Ao Root diam: 3.00 cm MITRAL VALVE MV Area (PHT): 5.93 cm     SHUNTS MV Area VTI:   4.56 cm     Systemic VTI:  0.40 m MV Peak grad:  9.5 mmHg     Systemic Diam: 1.90 cm MV Mean grad:  4.0 mmHg MV Vmax:       1.54 m/s MV Vmean:      86.9 cm/s MV Decel Time: 128 msec MV E velocity: 64.30 cm/s MV A velocity: 146.00 cm/s MV E/A ratio:  0.44 Dwayne D Callwood MD Electronically signed by Yolonda Kida MD Signature Date/Time: 09/09/2020/4:42:41 PM    Final      Management plans discussed with the patient, family and they are in  agreement.  CODE STATUS:     Code Status Orders  (From admission, onward)  Start     Ordered   09/08/20 1304  Do not attempt resuscitation (DNR)  Continuous       Question Answer Comment  In the event of cardiac or respiratory ARREST Do not call a "code blue"   In the event of cardiac or respiratory ARREST Do not perform Intubation, CPR, defibrillation or ACLS   In the event of cardiac or respiratory ARREST Use medication by any route, position, wound care, and other measures to relive pain and suffering. May use oxygen, suction and manual treatment of airway obstruction as needed for comfort.   Comments CODE STATUS was discussed with patient's son Edd Arbour Skalla who was at the bedside and she is a DO NOT RESUSCITATE      09/08/20 1305        Code Status History    Date Active Date Inactive Code Status Order ID Comments User Context   08/30/2019 1000 08/31/2019 2334 Partial Code [id number]  Ivor Costa, MD ED   06/10/2018 2209 06/10/2018 2209 Full Code [id number]  Gorden Harms, MD ED   06/10/2018 2209 06/10/2018 2209 Partial Code [id number]  Salary, Avel Peace, MD ED   12/19/2015 2332 12/20/2015 1844 Full Code [id number]  Lance Coon, MD Inpatient   Advance Care Planning Activity      TOTAL TIME TAKING CARE OF THIS PATIENT: 35 minutes.    Loletha Grayer M.D on 09/10/2020 at 4:15 PM  Between 7am to 6pm - Pager - [phone]  After 6pm go to [email or web address] - password [email or web address]  [facility]  CC: Primary care physician; Maryland Pink, MD

## 2020-09-10 NOTE — Progress Notes (Signed)
[facility]    SUBJECTIVE: The patient reports feeling fine this morning with no chest pain or shortness of breath.    Vitals:   09/09/20 2125 09/10/20 0009 09/10/20 0410 09/10/20 0818  BP: (!) 107/53 (!) 123/47 (!) 114/45 (!) 141/82  Pulse: 75 65 72 70  Resp: 18 18 18 19   Temp: 98 F (36.7 C) 98.1 F (36.7 C) 98.3 F (36.8 C) 99.1 F (37.3 C)  TempSrc: Oral  Oral Oral  SpO2: 98% 94% 94% 95%  Weight:      Height:         Intake/Output Summary (Last 24 hours) at 09/10/2020 7341 Last data filed at 09/09/2020 1845 Gross per 24 hour  Intake 480 ml  Output 0 ml  Net 480 ml      PHYSICAL EXAM  General: Well developed, well nourished, in no acute distress, sitting up in bed eating breakfast, smiling HEENT:  Normocephalic and atramatic Neck:  No JVD.  Lungs: Clear bilaterally to auscultation, NL effort of breathing on room air. Heart: HRRR . Normal S1 and S2 without gallops or murmurs.  Extremities: No clubbing, cyanosis or edema.   Neuro: Alert and oriented X 3. Psych:  Good affect, responds appropriately   LABS: Basic Metabolic Panel: Recent Labs    09/09/20 0719 09/10/20 0159  NA 134* 132*  K 4.2 4.5  CL 107 104  CO2 21* 21*  GLUCOSE 278* 151*  BUN 31* 35*  CREATININE 1.72* 2.09*  CALCIUM 8.3* 8.3*  MG 1.7 2.3   Liver Function Tests: Recent Labs    09/08/20 0948  AST 28  ALT 18  ALKPHOS 86  BILITOT 1.3*  PROT 6.7  ALBUMIN 3.4*   No results for input(s): LIPASE, AMYLASE in the last 72 hours. CBC: Recent Labs    09/08/20 0948 09/09/20 0719 09/10/20 0159  WBC 11.9* 11.9* 14.0*  NEUTROABS 9.6*  --   --   HGB 12.8 12.0 11.6*  HCT 36.5 34.5* 33.9*  MCV 96.1 95.6 96.6  PLT 223 205 197   Cardiac Enzymes: No results for input(s): CKTOTAL, CKMB, CKMBINDEX, TROPONINI in the last 72 hours. BNP: Invalid input(s): POCBNP D-Dimer: No results for input(s): DDIMER in the last 72 hours. Hemoglobin A1C: Recent Labs    09/08/20 1452  HGBA1C 13.3*    Fasting Lipid Panel: Recent Labs    09/10/20 0159  CHOL 163  HDL 49  LDLCALC 91  TRIG 117  CHOLHDL 3.3   Thyroid Function Tests: No results for input(s): TSH, T4TOTAL, T3FREE, THYROIDAB in the last 72 hours.  Invalid input(s): FREET3 Anemia Panel: No results for input(s): VITAMINB12, FOLATE, FERRITIN, TIBC, IRON, RETICCTPCT in the last 72 hours.  DG Chest 2 View  Result Date: 09/08/2020 CLINICAL DATA:  Chest pain. EXAM: CHEST - 2 VIEW COMPARISON:  Chest radiograph 11/07/2019 FINDINGS: Stable enlarged cardiac silhouette. Calcified aortic arch. Large hiatal hernia noted. No effusion, infiltrate or pneumothorax. Degenerative osteophytosis of the spine. IMPRESSION: 1. No acute cardiopulmonary findings. 2. Stable cardiomegaly. 3. Large hiatal hernia. 4.  Aortic Atherosclerosis (ICD10-I70.0). Electronically Signed   By: Suzy Bouchard M.D.   On: 09/08/2020 10:32   ECHOCARDIOGRAM COMPLETE  Result Date: 09/09/2020    ECHOCARDIOGRAM REPORT   Patient Name:   RIKKI TROSPER Hamme Date of Exam: 09/09/2020 Medical Rec #:  [id number]     Height:       65.0 in Accession #:    [id number]    Weight:  140.0 lb Date of Birth:  15-Jan-1925     BSA:          1.700 m Patient Age:    32 years      BP:           137/54 mmHg Patient Gender: F             HR:           89 bpm. Exam Location:  [facility] Procedure: 2D Echo, Color Doppler and Cardiac Doppler Indications:     I21.4 NSTEMI  History:         Patient has prior history of Echocardiogram examinations. Risk                  Factors:Hypertension and Diabetes.  Sonographer:     Charmayne Sheer RDCS (AE) Referring Phys:  [id number] Collier Bullock Diagnosing Phys: Yolonda Kida MD  Sonographer Comments: Suboptimal parasternal window. IMPRESSIONS  1. Evidence of HCOM.  2. Left ventricular ejection fraction, by estimation, is 65 to 70%. The left ventricle has hyperdynamic function. The left ventricle demonstrates global hypokinesis. There is moderate concentric left ventricular  hypertrophy. Left ventricular diastolic parameters are consistent with Grade III diastolic dysfunction (restrictive).  3. Right ventricular systolic function is normal. The right ventricular size is normal. Mildly increased right ventricular wall thickness.  4. The mitral valve is normal in structure. Mild to moderate mitral valve regurgitation.  5. Tricuspid valve regurgitation is mild to moderate.  6. The aortic valve is grossly normal. Aortic valve regurgitation is not visualized. Mild aortic valve sclerosis is present, with no evidence of aortic valve stenosis. Conclusion(s)/Recommendation(s): Findings consistent with hypertrophic obstructive cardiomyopathy. FINDINGS  Left Ventricle: Left ventricular ejection fraction, by estimation, is 65 to 70%. The left ventricle has hyperdynamic function. The left ventricle demonstrates global hypokinesis. The left ventricular internal cavity size was small. There is moderate concentric left ventricular hypertrophy. Left ventricular diastolic parameters are consistent with Grade III diastolic dysfunction (restrictive). Right Ventricle: The right ventricular size is normal. Mildly increased right ventricular wall thickness. Right ventricular systolic function is normal. Left Atrium: Left atrial size was normal in size. Right Atrium: Right atrial size was normal in size. Pericardium: There is no evidence of pericardial effusion. Mitral Valve: The mitral valve is normal in structure. Mild to moderate mitral valve regurgitation. MV peak gradient, 9.5 mmHg. The mean mitral valve gradient is 4.0 mmHg. Tricuspid Valve: The tricuspid valve is grossly normal. Tricuspid valve regurgitation is mild to moderate. Aortic Valve: The aortic valve is grossly normal. Aortic valve regurgitation is not visualized. Mild aortic valve sclerosis is present, with no evidence of aortic valve stenosis. Aortic valve mean gradient measures 15.0 mmHg. Aortic valve peak gradient measures 23.0 mmHg. Aortic  valve area, by VTI measures 2.12 cm. Pulmonic Valve: The pulmonic valve was grossly normal. Pulmonic valve regurgitation is not visualized. Aorta: The ascending aorta was not well visualized. IAS/Shunts: No atrial level shunt detected by color flow Doppler. Additional Comments: Evidence of HCOM.  LEFT VENTRICLE PLAX 2D LVIDd:         3.90 cm  Diastology LVIDs:         3.30 cm  LV e' medial:    5.87 cm/s LV PW:         1.30 cm  LV E/e' medial:  11.0 LV IVS:        1.10 cm  LV e' lateral:   5.66 cm/s LVOT diam:     1.90  cm  LV E/e' lateral: 11.4 LV SV:         113 LV SV Index:   67 LVOT Area:     2.84 cm  LEFT ATRIUM           Index LA diam:      3.80 cm 2.24 cm/m LA Vol (A2C): 37.2 ml 21.88 ml/m LA Vol (A4C): 69.1 ml 40.65 ml/m  AORTIC VALVE                    PULMONIC VALVE AV Area (Vmax):    2.20 cm     PV Vmax:       1.69 m/s AV Area (Vmean):   2.10 cm     PV Vmean:      105.000 cm/s AV Area (VTI):     2.12 cm     PV VTI:        0.251 m AV Vmax:           240.00 cm/s  PV Peak grad:  11.4 mmHg AV Vmean:          185.000 cm/s PV Mean grad:  5.0 mmHg AV VTI:            0.535 m AV Peak Grad:      23.0 mmHg AV Mean Grad:      15.0 mmHg LVOT Vmax:         186.00 cm/s LVOT Vmean:        137.000 cm/s LVOT VTI:          0.400 m LVOT/AV VTI ratio: 0.75  AORTA Ao Root diam: 3.00 cm MITRAL VALVE MV Area (PHT): 5.93 cm     SHUNTS MV Area VTI:   4.56 cm     Systemic VTI:  0.40 m MV Peak grad:  9.5 mmHg     Systemic Diam: 1.90 cm MV Mean grad:  4.0 mmHg MV Vmax:       1.54 m/s MV Vmean:      86.9 cm/s MV Decel Time: 128 msec MV E velocity: 64.30 cm/s MV A velocity: 146.00 cm/s MV E/A ratio:  0.44 Dwayne D Callwood MD Electronically signed by Yolonda Kida MD Signature Date/Time: 09/09/2020/4:42:41 PM    Final      Echo LVEF 65-70% global hypokinesis, moderate LVH, mild to moderate MR, TR  TELEMETRY: sinus rhythm, 72 bpm:  ASSESSMENT AND PLAN:  Principal Problem:   NSTEMI (non-ST elevated myocardial  infarction) (HCC) Active Problems:   SVT (supraventricular tachycardia) (HCC)   Benign essential HTN   Type 2 diabetes mellitus with hyperlipidemia (HCC)   Essential hypertension   Adult hypothyroidism   CKD stage 4 due to type 2 diabetes mellitus (HCC)   Hyponatremia   Hypomagnesemia    1. Non-ST elevation myocardial infarction, elevated high-sensitivity troponin (2984 and 3669), nondiagnostic ECG, currently chest pain-free on heparin infusion 2.Paroxysmal atrial fibrillation, CHA2DS2-VASc score 5, not on chronic anticoagulation due to advanced age and falling risk, currently in sinus rhythm. Patient did experience SVT yesterday, status post adenosine. VQ scan pending to rule out PE. 3.Chronic kidney disease stage IV,highrisk for contrast-induced nephrotoxicity 4.Essential hypertension,systolic blood pressure mildly elevated 5.Hyperlipidemia,on simvastatin   Recommendations: 1. Discontinue heparin drip this morning 2. Continue aspirin and Plavix  3. Continue Imdur, simvastatin, metoprolol tartrate and Cardizem 120 mg 4. Defer ACEi/ARB due to CKD IV  Clabe Seal, PA-C 09/10/2020 8:37 AM

## 2020-09-10 NOTE — Consult Note (Signed)
ANTICOAGULATION CONSULT NOTE - Follow Up Consult  Pharmacy Consult for Heparin Indication: chest pain/ACS  Allergies  Allergen Reactions  . Oxycodone Hcl Other (See Comments)    Other Reaction: GI Upset Other reaction(s): Other (See Comments) Other Reaction: GI Upset   . Oxycodone-Aspirin Other (See Comments)    Other Reaction: GI Upset (Per pt she does take aspirin 81 mg daily  . Augmentin [Amoxicillin-Pot Clavulanate] Nausea Only  . Codeine Nausea Only  . Etodolac Other (See Comments)  . Iodine Nausea Only  . Levaquin [Levofloxacin In D5w] Nausea Only  . Levofloxacin Nausea Only  . Metronidazole Nausea Only  . Ultram [Tramadol] Nausea Only  . Sulfa Antibiotics Nausea Only and Other (See Comments)    Other reaction(s): Unknown     Patient Measurements: Height: 5\' 5"  (165.1 cm) Weight: 69.2 kg (152 lb 8 oz) IBW/kg (Calculated) : 57 Heparin Dosing Weight: 63.5 kg   Vital Signs: Temp: 98.1 F (36.7 C) (04/05 0009) Temp Source: Oral (04/04 2125) BP: 123/47 (04/05 0009) Pulse Rate: 65 (04/05 0009)  Labs: Recent Labs    09/08/20 0948 09/08/20 1055 09/08/20 1146 09/08/20 1452 09/08/20 1735 09/08/20 2030 09/09/20 0719 09/09/20 1721 09/10/20 0159  HGB 12.8  --   --   --   --   --  12.0  --  11.6*  HCT 36.5  --   --   --   --   --  34.5*  --  33.9*  PLT 223  --   --   --   --   --  205  --  197  APTT  --  35  --   --   --   --   --   --   --   LABPROT  --  12.9  --   --   --   --   --   --   --   INR  --  1.0  --   --   --   --   --   --   --   HEPARINUNFRC  --   --   --   --   --    < > 0.28* 0.79* 0.50  CREATININE 1.71*  --   --   --   --   --  1.72*  --  2.09*  TROPONINIHS 2,984*  --  3,629* 3,629* 3,175*  --   --   --   --    < > = values in this interval not displayed.    Estimated Creatinine Clearance: 16.1 mL/min (A) (by C-G formula based on SCr of 2.09 mg/dL (H)).   Medications:  Medications Prior to Admission  Medication Sig Dispense Refill Last  Dose  . allopurinol (ZYLOPRIM) 100 MG tablet Take 100 mg by mouth daily.   09/08/2020 at AM  . donepezil (ARICEPT) 5 MG tablet Take 1 tablet by mouth every evening.   09/07/2020 at PM  . ferrous sulfate 325 (65 FE) MG EC tablet Take 1 tablet (325 mg total) by mouth 2 (two) times daily. 60 tablet 3 09/07/2020 at PM  . glipiZIDE (GLUCOTROL XL) 5 MG 24 hr tablet Take 5 mg by mouth 2 (two) times daily.    09/07/2020 at PM  . irbesartan-hydrochlorothiazide (AVALIDE) 300-12.5 MG tablet Take 1 tablet by mouth daily.    09/08/2020 at AM  . levothyroxine (SYNTHROID, LEVOTHROID) 50 MCG tablet Take 50 mcg by mouth daily.    09/08/2020 at AM  .  metoprolol tartrate (LOPRESSOR) 25 MG tablet Take 25 mg by mouth 2 (two) times daily.   09/08/2020 at AM  . pantoprazole (PROTONIX) 40 MG tablet Take 1 tablet (40 mg total) by mouth daily. 30 tablet 1 09/08/2020 at AM  . pioglitazone (ACTOS) 30 MG tablet Take 30 mg by mouth daily.   09/08/2020 at AM  . simvastatin (ZOCOR) 20 MG tablet Take 20 mg by mouth every evening.    09/07/2020 at PM  . TRADJENTA 5 MG TABS tablet Take 5 mg by mouth daily.    09/08/2020 at AM  . Vitamin D, Ergocalciferol, (DRISDOL) 1.25 MG (50000 UT) CAPS capsule Take 50,000 Units by mouth every Saturday.    09/07/2020 at PM  . cyanocobalamin (,VITAMIN B-12,) 1000 MCG/ML injection Inject 1,000 mcg into the muscle every 30 (thirty) days.       Scheduled:  . allopurinol  100 mg Oral Daily  . aspirin EC  81 mg Oral Daily  . clopidogrel  75 mg Oral Daily  . diltiazem  120 mg Oral Daily  . donepezil  5 mg Oral QPM  . insulin aspart  0-15 Units Subcutaneous TID WC  . insulin aspart  2 Units Subcutaneous TID WC  . insulin glargine  6 Units Subcutaneous Daily  . isosorbide mononitrate  30 mg Oral Daily  . levothyroxine  50 mcg Oral Q0600  . metoprolol tartrate  50 mg Oral BID  . pantoprazole  40 mg Oral Daily  . simvastatin  20 mg Oral QPM  . [START ON 09/14/2020] Vitamin D (Ergocalciferol)  50,000 Units Oral Q Sat    Infusions:  . heparin 950 Units/hr (09/09/20 2118)   PRN: acetaminophen, metoprolol tartrate, nitroGLYCERIN, ondansetron (ZOFRAN) IV Anti-infectives (From admission, onward)   None      Assessment: 87yo female who presented to ED for chest pain. Pt has history of HTN, DM on multiple oral agents, hypothyroidism. Previous paroxysmal A. fib for which she follows with cardiology, and not on anticoagulation due to advance age and fall risk. Trop HS 2984; baseline H&H and Plt WNL. Pharmacy has been consulted for heparin dosing and monitoring.   4/3 2030 HL 0.28 increased infusion to 1050 units/hr.  4/4 1721 HL 0.79 decrease to 950 units/hr 4/5 0159 HL 0.50, therapeutic  Goal of Therapy:  Heparin level 0.3-0.7 units/ml Monitor platelets by anticoagulation protocol: Yes   Plan:  Continue heparin infusion at 950 units/hr. Recheck heparin level in 8 hours to confirm. CBC daily while on heparin. Plan is to continue heparin for 48 hours per cards.   Renda Rolls, PharmD, [facility] 09/10/2020 3:09 AM

## 2020-09-10 NOTE — Progress Notes (Signed)
Heart rate at res 66, patient ambulated in hallway heart rate 80. Patient tolerated well, gait slightly unsteady. Will continue to monitor.

## 2020-09-10 NOTE — Progress Notes (Signed)
PT Cancellation Note  Patient Details Name: Erika Ochoa MRN: [id number] DOB: December 22, 1924   Cancelled Treatment:    Reason Eval/Treat Not Completed: Patient at procedure or test/unavailable. Will return later to attempt evaluation.    Onis Markoff 09/10/2020, 9:29 AM

## 2020-09-10 NOTE — Evaluation (Signed)
Physical Therapy Evaluation Patient Details Name: Erika Ochoa MRN: [id number] DOB: [birth year] Today's Date: 09/10/2020   History of Present Illness  Erika Ochoa is a 87 y.o. female with medical history significant for diabetes mellitus, history of paroxysmal atrial fibrillation, hypothyroidism, hypertension who presents to the ER via EMS for evaluation of chest pain.  Chest pain started the night prior to admission after she had gone to bed.  It was substernal and nonradiating.  Pain was constant with no known relieving or aggravating factors.  She denied having any nausea, no vomiting, no diaphoresis, no shortness of breath or palpitations. Here with NSTEMI.  Clinical Impression  Patient received in bed, HOH. Very pleasant and eager to get up moving. Reports she is not used to being waited on or lying in bed. Patient is mod independent with bed mobility, transferred sit to stand with supervision. She ambulated 40 feet in room with support of IV pole. Good tolerance. She will continue to benefit from skilled PT while here to ensure safety with mobility for return home independently. May benefit from HHPT at discharge.      Follow Up Recommendations Home health PT    Equipment Recommendations  None recommended by PT    Recommendations for Other Services       Precautions / Restrictions Precautions Precautions: Fall Restrictions Weight Bearing Restrictions: No      Mobility  Bed Mobility Overal bed mobility: Modified Independent                  Transfers Overall transfer level: Modified independent Equipment used: None                Ambulation/Gait Ambulation/Gait assistance: Supervision Gait Distance (Feet): 40 Feet Assistive device: IV Pole Gait Pattern/deviations: Step-through pattern;Decreased step length - right;Decreased step length - left Gait velocity: decr   General Gait Details: patient feeling slightly weaker than usual from being in bed. Leaning  on IV pole for support. MIn guard/supervision for ambulation.  Stairs            Wheelchair Mobility    Modified Rankin (Stroke Patients Only)       Balance Overall balance assessment: Needs assistance Sitting-balance support: Feet supported Sitting balance-Leahy Scale: Normal     Standing balance support: Bilateral upper extremity supported;During functional activity Standing balance-Leahy Scale: Fair Standing balance comment: less confident/steady this date, holding to IV pole for support.                             Pertinent Vitals/Pain Pain Assessment: No/denies pain    Home Living Family/patient expects to be discharged to:: Private residence Living Arrangements: Alone Available Help at Discharge: Family;Available PRN/intermittently Type of Home: House       Home Layout: Two level Home Equipment: None      Prior Function Level of Independence: Independent         Comments: Patient reports she is completely independent, drives, does her own shopping, yard work     Journalist, newspaper        Extremity/Trunk Assessment   Upper Extremity Assessment Upper Extremity Assessment: Overall WFL for tasks assessed    Lower Extremity Assessment Lower Extremity Assessment: Overall WFL for tasks assessed    Cervical / Trunk Assessment Cervical / Trunk Assessment: Normal  Communication   Communication: HOH;Other (comment) (Wears hearing aides, one is broken and the other needs battery.)  Cognition Arousal/Alertness: Awake/alert Behavior During  Therapy: WFL for tasks assessed/performed Overall Cognitive Status: Within Functional Limits for tasks assessed                                        General Comments      Exercises     Assessment/Plan    PT Assessment Patient needs continued PT services  PT Problem List Decreased strength;Decreased mobility       PT Treatment Interventions Therapeutic exercise;Gait  training;Functional mobility training;Stair training;Therapeutic activities;Patient/family education    PT Goals (Current goals can be found in the Care Plan section)  Acute Rehab PT Goals Patient Stated Goal: to return home PT Goal Formulation: With patient Time For Goal Achievement: 09/17/20 Potential to Achieve Goals: Good    Frequency Min 2X/week   Barriers to discharge Decreased caregiver support      Co-evaluation               AM-PAC PT "6 Clicks" Mobility  Outcome Measure Help needed turning from your back to your side while in a flat bed without using bedrails?: None Help needed moving from lying on your back to sitting on the side of a flat bed without using bedrails?: None Help needed moving to and from a bed to a chair (including a wheelchair)?: A Little Help needed standing up from a chair using your arms (e.g., wheelchair or bedside chair)?: None Help needed to walk in hospital room?: A Little Help needed climbing 3-5 steps with a railing? : A Little 6 Click Score: 21    End of Session Equipment Utilized During Treatment: Gait belt Activity Tolerance: Patient tolerated treatment well Patient left: in chair;with call bell/phone within reach Nurse Communication: Mobility status PT Visit Diagnosis: Muscle weakness (generalized) (M62.81)    Time: 5697-9480 PT Time Calculation (min) (ACUTE ONLY): 17 min   Charges:   PT Evaluation $PT Eval Moderate Complexity: 1 Mod PT Treatments $Gait Training: 8-22 mins        Marzelle Rutten, PT, GCS 09/10/20,11:40 AM

## 2020-09-10 NOTE — Progress Notes (Signed)
Erika Ochoa to be D/C'd Home per MD order.  Discussed prescriptions and follow up appointments with the patient. Prescriptions electronically submitted medication list explained in detail. Pt and family verbalized understanding.  Allergies as of 09/10/2020      Reactions   Oxycodone Hcl Other (See Comments)   Other Reaction: GI Upset Other reaction(s): Other (See Comments) Other Reaction: GI Upset   Oxycodone-aspirin Other (See Comments)   Other Reaction: GI Upset (Per pt she does take aspirin 81 mg daily   Augmentin [amoxicillin-pot Clavulanate] Nausea Only   Codeine Nausea Only   Etodolac Other (See Comments)   Iodine Nausea Only   Levaquin [levofloxacin In D5w] Nausea Only   Levofloxacin Nausea Only   Metronidazole Nausea Only   Ultram [tramadol] Nausea Only   Sulfa Antibiotics Nausea Only, Other (See Comments)   Other reaction(s): Unknown      Medication List    STOP taking these medications   irbesartan-hydrochlorothiazide 300-12.5 MG tablet Commonly known as: AVALIDE   simvastatin 20 MG tablet Commonly known as: ZOCOR     TAKE these medications   allopurinol 100 MG tablet Commonly known as: ZYLOPRIM Take 100 mg by mouth daily.   aspirin 81 MG EC tablet Take 1 tablet (81 mg total) by mouth daily. Swallow whole. Start taking on: September 11, 2020   atorvastatin 40 MG tablet Commonly known as: Lipitor Take 1 tablet (40 mg total) by mouth daily.   clopidogrel 75 MG tablet Commonly known as: PLAVIX Take 1 tablet (75 mg total) by mouth daily. Start taking on: September 11, 2020   cyanocobalamin 1000 MCG/ML injection Commonly known as: (VITAMIN B-12) Inject 1,000 mcg into the muscle every 30 (thirty) days.   diltiazem 120 MG 24 hr capsule Commonly known as: CARDIZEM CD Take 1 capsule (120 mg total) by mouth daily. Start taking on: September 11, 2020   donepezil 5 MG tablet Commonly known as: ARICEPT Take 1 tablet by mouth every evening.   ferrous sulfate 325 (65 FE) MG  EC tablet Take 1 tablet (325 mg total) by mouth 2 (two) times daily.   glipiZIDE 5 MG 24 hr tablet Commonly known as: GLUCOTROL XL Take 5 mg by mouth 2 (two) times daily.   isosorbide mononitrate 30 MG 24 hr tablet Commonly known as: IMDUR Take 1 tablet (30 mg total) by mouth daily. Start taking on: September 11, 2020   levothyroxine 50 MCG tablet Commonly known as: SYNTHROID Take 50 mcg by mouth daily.   metoprolol tartrate 50 MG tablet Commonly known as: LOPRESSOR Take 1 tablet (50 mg total) by mouth 2 (two) times daily. What changed:   medication strength  how much to take   nitroGLYCERIN 0.4 MG SL tablet Commonly known as: NITROSTAT Place 1 tablet (0.4 mg total) under the tongue every 5 (five) minutes x 3 doses as needed for chest pain (max 3).   pantoprazole 40 MG tablet Commonly known as: Protonix Take 1 tablet (40 mg total) by mouth daily.   pioglitazone 30 MG tablet Commonly known as: ACTOS Take 30 mg by mouth daily.   Tradjenta 5 MG Tabs tablet Generic drug: linagliptin Take 5 mg by mouth daily.   Vitamin D (Ergocalciferol) 1.25 MG (50000 UNIT) Caps capsule Commonly known as: DRISDOL Take 50,000 Units by mouth every Saturday.       Vitals:   09/10/20 0818 09/10/20 1128  BP: (!) 141/82 105/60  Pulse: 70 63  Resp: 19 (!) 24  Temp: 99.1 F (  37.3 C) 98.6 F (37 C)  SpO2: 95% 95%    Skin clean, dry and intact without evidence of skin break down, no evidence of skin tears noted. IV catheter discontinued intact. Site without signs and symptoms of complications. Dressing and pressure applied. Pt denies pain at this time. No complaints noted.  An After Visit Summary was printed and given to the patient. Patient escorted via Erika Ochoa, and D/C home via private auto. Waiting for AD Erika Ochoa to speak with family regarding broken hearing aide.  [place]

## 2020-09-15 ENCOUNTER — Other Ambulatory Visit: Payer: Self-pay

## 2020-09-15 ENCOUNTER — Emergency Department
Admission: EM | Admit: 2020-09-15 | Discharge: 2020-09-15 | Disposition: A | Discharge status: Home / Self Care | Payer: Medicare HMO | Attending: Emergency Medicine | Admitting: Emergency Medicine

## 2020-09-15 ENCOUNTER — Emergency Department: Payer: Medicare HMO

## 2020-09-15 ENCOUNTER — Encounter: Payer: Self-pay | Admitting: Emergency Medicine

## 2020-09-15 DIAGNOSIS — E1122 Type 2 diabetes mellitus with diabetic chronic kidney disease: Secondary | ICD-10-CM | POA: Diagnosis not present

## 2020-09-15 DIAGNOSIS — I129 Hypertensive chronic kidney disease with stage 1 through stage 4 chronic kidney disease, or unspecified chronic kidney disease: Secondary | ICD-10-CM | POA: Insufficient documentation

## 2020-09-15 DIAGNOSIS — Z7982 Long term (current) use of aspirin: Secondary | ICD-10-CM | POA: Insufficient documentation

## 2020-09-15 DIAGNOSIS — I2510 Atherosclerotic heart disease of native coronary artery without angina pectoris: Secondary | ICD-10-CM | POA: Insufficient documentation

## 2020-09-15 DIAGNOSIS — Z7902 Long term (current) use of antithrombotics/antiplatelets: Secondary | ICD-10-CM | POA: Insufficient documentation

## 2020-09-15 DIAGNOSIS — R519 Headache, unspecified: Secondary | ICD-10-CM | POA: Diagnosis not present

## 2020-09-15 DIAGNOSIS — Z85828 Personal history of other malignant neoplasm of skin: Secondary | ICD-10-CM | POA: Diagnosis not present

## 2020-09-15 DIAGNOSIS — N184 Chronic kidney disease, stage 4 (severe): Secondary | ICD-10-CM | POA: Diagnosis not present

## 2020-09-15 DIAGNOSIS — E039 Hypothyroidism, unspecified: Secondary | ICD-10-CM | POA: Diagnosis not present

## 2020-09-15 DIAGNOSIS — Z79899 Other long term (current) drug therapy: Secondary | ICD-10-CM | POA: Diagnosis not present

## 2020-09-15 LAB — URINALYSIS, COMPLETE (UACMP) WITH MICROSCOPIC
Bilirubin Urine: NEGATIVE
Glucose, UA: >=500 mg/dL — AB
Ketones, ur: NEGATIVE mg/dL
Nitrite: NEGATIVE
Protein, ur: NEGATIVE mg/dL
Specific Gravity, Urine: 1.013 (ref 1.005–1.030)
pH: 5 (ref 5.0–8.0)

## 2020-09-15 LAB — BASIC METABOLIC PANEL
Anion gap: 7 (ref 5–15)
BUN: 26 mg/dL — ABNORMAL HIGH (ref 8–23)
CO2: 23 mmol/L (ref 22–32)
Calcium: 8.7 mg/dL — ABNORMAL LOW (ref 8.9–10.3)
Chloride: 102 mmol/L (ref 98–111)
Creatinine, Ser: 1.53 mg/dL — ABNORMAL HIGH (ref 0.44–1.00)
GFR, Estimated: 31 mL/min — ABNORMAL LOW (ref >60)
Glucose, Bld: 273 mg/dL — ABNORMAL HIGH (ref 70–99)
Potassium: 4 mmol/L (ref 3.5–5.1)
Sodium: 132 mmol/L — ABNORMAL LOW (ref 135–145)

## 2020-09-15 LAB — CBC
HCT: 37 % (ref 36.0–46.0)
Hemoglobin: 12.5 g/dL (ref 12.0–15.0)
MCH: 33 pg (ref 26.0–34.0)
MCHC: 33.8 g/dL (ref 30.0–36.0)
MCV: 97.6 fL (ref 80.0–100.0)
Platelets: 219 10*3/uL (ref 150–400)
RBC: 3.79 MIL/uL — ABNORMAL LOW (ref 3.87–5.11)
RDW: 14.6 % (ref 11.5–15.5)
WBC: 10.2 10*3/uL (ref 4.0–10.5)
nRBC: 0 % (ref 0.0–0.2)

## 2020-09-15 LAB — TROPONIN I (HIGH SENSITIVITY)
Troponin I (High Sensitivity): 579 ng/L (ref <18)
Troponin I (High Sensitivity): 547 ng/L (ref <18)

## 2020-09-15 MED ORDER — ACETAMINOPHEN 325 MG PO TABS
650.0000 mg | ORAL_TABLET | Freq: Once | ORAL | Status: AC
  Administered 2020-09-15: 650 mg via ORAL
  Filled 2020-09-15: qty 2

## 2020-09-15 MED ORDER — PROCHLORPERAZINE EDISYLATE 10 MG/2ML IJ SOLN
5.0000 mg | Freq: Once | INTRAMUSCULAR | Status: AC
  Administered 2020-09-15: 5 mg via INTRAVENOUS
  Filled 2020-09-15: qty 2

## 2020-09-15 NOTE — ED Triage Notes (Signed)
Pt to ED via POV for severe headache. Pt was discharged on Tuesday after NSTEMI. Pt was started on a coup-le new medications while in the hospital. Pt had follow up with Dr. Nehemiah Massed and he stopped one of the medication but pts son is not sure which one. Per son Dr. Nehemiah Massed said the headache should be better in a day or 2 but pt is not feeling any better, pt states one spot in her head is extremely sensitive. Pt is in NAD.

## 2020-09-15 NOTE — ED Notes (Signed)
Patient in stretcher, c/o headache 10/10, "worst she has ever had". Patient's husband bedside, lights dimmed, door closed, given pillow and blankets. Patient states she is more comfortable now.

## 2020-09-15 NOTE — ED Notes (Signed)
Critical troponin received: 579. J Cuthriell, PA informed.

## 2020-09-15 NOTE — ED Provider Notes (Signed)
[facility] Emergency Department Provider Note  ____________________________________________  Time seen: Approximately 4:16 PM  I have reviewed the triage vital signs and the nursing notes.   HISTORY  Chief Complaint Headache and Weakness    HPI Erika Ochoa is a 87 y.o. female who presents the emergency department complaining of headache.  Patient was admitted last week for an NSTEMI.  Patient had done well, was discharged from the hospital.  Patient has been placed on multiple medications including Imdur and developed a headache.  Cardiology felt that her headache was likely secondary to the Imdur and this was canceled.  Patient has had a headache x4 days.  It is at the apex of the head.  Patient states that she cannot palpate and reproduce symptoms but states "I can almost touch it."  No visual changes.  No weakness.  Patient's family member states that there has been no confusion, altered mental status or difficulty formulating words.  Patient has a history of NSTEMI, diabetes, hypertension, chronic kidney disease, history of GI bleeding, history of SVT, coronary artery disease, hypothyroidism..  Patient is not on anticoagulation due to fall risk, GI bleeding risk and anemia.  Patient's primary complaint today is this headache.   Patient states that the headache has not been worsening nor has it been improving x4 days.  No visual changes, no unilateral weakness.  Other than stopping the Imdur, no medications for this complaint prior to arrival        Past Medical History:  Diagnosis Date  . Abnormal mammogram, unspecified   . Actinic keratosis   . Breast screening, unspecified   . Diabetes ([place])   . Hypertension   . Mammographic microcalcification   . Squamous cell carcinoma of skin 06/14/2019   Left superior forehead. Well differentiated. Tx: EDC  . Squamous cell carcinoma of skin 09/13/2019   L of midline med forehead at hairline SCCIS   . Vaginal  pessary present     Patient Active Problem List   Diagnosis Date Noted  . Hypomagnesemia   . NSTEMI (non-ST elevated myocardial infarction) ([place]) 09/08/2020  . CKD stage 4 due to type 2 diabetes mellitus ([place]) 09/08/2020  . Hyponatremia 09/08/2020  . Symptomatic anemia 08/30/2019  . Gout 08/30/2019  . Elevated troponin 08/30/2019  . GIB (gastrointestinal bleeding) 06/10/2018  . Cellulitis of leg, left 03/07/2018  . Bilateral edema of lower extremity 03/07/2018  . Bilateral lower extremity pain 03/07/2018  . Cystocele, midline 08/12/2016  . Uterovaginal prolapse, incomplete 08/12/2016  . History of transient ischemic attack (TIA) 02/05/2016  . TIA (transient ischemic attack) 12/19/2015  . Acute kidney injury superimposed on CKD ([place]) 12/19/2015  . Primary osteoarthritis of both first carpometacarpal joints 07/24/2015  . B12 deficiency 02/25/2015  . SVT (supraventricular tachycardia) ([place]) 07/11/2014  . Cervical pain 07/11/2014  . CAD (coronary artery disease) 07/11/2014  . Benign essential HTN 07/11/2014  . HLD (hyperlipidemia) 07/11/2014  . Type 2 diabetes mellitus with hyperlipidemia ([place]) 07/11/2014  . Essential hypertension 07/11/2014  . Hypothyroidism 07/11/2014    Past Surgical History:  Procedure Laterality Date  . BREAST BIOPSY Left 2013   stereo calcs  . BREAST EXCISIONAL BIOPSY Left    "years ago"-benign  . ESOPHAGOGASTRODUODENOSCOPY Left 06/12/2018   Procedure: ESOPHAGOGASTRODUODENOSCOPY (EGD);  Surgeon: Virgel Manifold, MD;  Location: [facility] ENDOSCOPY;  Service: Endoscopy;  Laterality: Left;  . ESOPHAGOGASTRODUODENOSCOPY N/A 08/31/2019   Procedure: ESOPHAGOGASTRODUODENOSCOPY (EGD);  Surgeon: Toledo, Benay Pike, MD;  Location: [facility] ENDOSCOPY;  Service: Gastroenterology;  Laterality: N/A;  . SKIN LESION EXCISION      Prior to Admission medications   Medication Sig Start Date End Date Taking? Authorizing Provider  allopurinol (ZYLOPRIM) 100 MG tablet Take 100 mg  by mouth daily. 08/23/19   [provider]  aspirin EC 81 MG EC tablet Take 1 tablet (81 mg total) by mouth daily. Swallow whole. 09/11/20   Loletha Grayer, MD  atorvastatin (LIPITOR) 40 MG tablet Take 1 tablet (40 mg total) by mouth daily. 09/10/20 09/10/21  Loletha Grayer, MD  clopidogrel (PLAVIX) 75 MG tablet Take 1 tablet (75 mg total) by mouth daily. 09/11/20   Loletha Grayer, MD  cyanocobalamin (,VITAMIN B-12,) 1000 MCG/ML injection Inject 1,000 mcg into the muscle every 30 (thirty) days.  10/25/15   [provider]  diltiazem (CARDIZEM CD) 120 MG 24 hr capsule Take 1 capsule (120 mg total) by mouth daily. 09/11/20   Loletha Grayer, MD  donepezil (ARICEPT) 5 MG tablet Take 1 tablet by mouth every evening. 07/20/20   [provider]  ferrous sulfate 325 (65 FE) MG EC tablet Take 1 tablet (325 mg total) by mouth 2 (two) times daily. 08/31/19 08/30/20  Lorella Nimrod, MD  glipiZIDE (GLUCOTROL XL) 5 MG 24 hr tablet Take 5 mg by mouth 2 (two) times daily.  07/01/18   [provider]  isosorbide mononitrate (IMDUR) 30 MG 24 hr tablet Take 1 tablet (30 mg total) by mouth daily. 09/11/20   Loletha Grayer, MD  levothyroxine (SYNTHROID, LEVOTHROID) 50 MCG tablet Take 50 mcg by mouth daily.     [provider]  metoprolol tartrate (LOPRESSOR) 50 MG tablet Take 1 tablet (50 mg total) by mouth 2 (two) times daily. 09/10/20   Loletha Grayer, MD  nitroGLYCERIN (NITROSTAT) 0.4 MG SL tablet Place 1 tablet (0.4 mg total) under the tongue every 5 (five) minutes x 3 doses as needed for chest pain (max 3). 09/10/20   Loletha Grayer, MD  pantoprazole (PROTONIX) 40 MG tablet Take 1 tablet (40 mg total) by mouth daily. 08/31/19 08/30/20  Lorella Nimrod, MD  pioglitazone (ACTOS) 30 MG tablet Take 30 mg by mouth daily. 07/12/19   [provider]  TRADJENTA 5 MG TABS tablet Take 5 mg by mouth daily.  07/01/18   [provider]  Vitamin D, Ergocalciferol, (DRISDOL) 1.25 MG  (50000 UT) CAPS capsule Take 50,000 Units by mouth every Saturday.  08/04/18   [provider]    Allergies Oxycodone hcl, Oxycodone-aspirin, Augmentin [amoxicillin-pot clavulanate], Codeine, Etodolac, Iodine, Levaquin [levofloxacin in d5w], Levofloxacin, Metronidazole, Ultram [tramadol], and Sulfa antibiotics  Family History  Problem Relation Age of Onset  . Cancer Mother   . Breast cancer Neg Hx     Social History Social History   Tobacco Use  . Smoking status: Never Smoker  . Smokeless tobacco: Never Used  Vaping Use  . Vaping Use: Never used  Substance Use Topics  . Alcohol use: No    Alcohol/week: 0.0 standard drinks  . Drug use: No     Review of Systems  Constitutional: No fever/chills Eyes: No visual changes. No discharge ENT: No upper respiratory complaints. Cardiovascular: no chest pain. Respiratory: no cough. No SOB. Gastrointestinal: No abdominal pain.  No nausea, no vomiting.  No diarrhea.  No constipation. Genitourinary: Negative for dysuria. No hematuria Musculoskeletal: Negative for musculoskeletal pain. Skin: Negative for rash, abrasions, lacerations, ecchymosis. Neurological: Positive for headache at the apex of the head.  Denies focal weakness or  numbness.  10 System ROS otherwise negative.  ____________________________________________   PHYSICAL EXAM:  VITAL SIGNS: ED Triage Vitals  Enc Vitals Group     BP 09/15/20 1529 (!) 155/58     Pulse Rate 09/15/20 1529 66     Resp 09/15/20 1529 16     Temp 09/15/20 1529 98.4 F (36.9 C)     Temp Source 09/15/20 1529 Oral     SpO2 09/15/20 1529 96 %     Weight 09/15/20 1530 152 lb 1.9 oz (69 kg)     Height 09/15/20 1530 5\' 5"  (1.651 m)     Head Circumference --      Peak Flow --      Pain Score 09/15/20 1530 10     Pain Loc --      Pain Edu? --      Excl. in [place]? --      Constitutional: Alert and oriented. Well appearing and in no acute distress. Eyes: Conjunctivae are normal. PERRL.  EOMI. Head: Atraumatic. ENT:      Ears:       Nose: No congestion/rhinnorhea.      Mouth/Throat: Mucous membranes are moist.  Neck: No stridor.   Cardiovascular: Normal rate, regular rhythm. Normal S1 and S2.  Good peripheral circulation. Respiratory: Normal respiratory effort without tachypnea or retractions. Lungs CTAB. Good air entry to the bases with no decreased or absent breath sounds. Musculoskeletal: Full range of motion to all extremities. No gross deformities appreciated. Neurologic:  Normal speech and language. No gross focal neurologic deficits are appreciated.  Cranial nerves II through XII grossly intact.  Equal grip strength bilateral upper extremities. Skin:  Skin is warm, dry and intact. No rash noted. Psychiatric: Mood and affect are normal. Speech and behavior are normal. Patient exhibits appropriate insight and judgement.   ____________________________________________   LABS (all labs ordered are listed, but only abnormal results are displayed)  Labs Reviewed  BASIC METABOLIC PANEL - Abnormal; Notable for the following components:      Result Value   Sodium 132 (*)    Glucose, Bld 273 (*)    BUN 26 (*)    Creatinine, Ser 1.53 (*)    Calcium 8.7 (*)    GFR, Estimated 31 (*)    All other components within normal limits  CBC - Abnormal; Notable for the following components:   RBC 3.79 (*)    All other components within normal limits  URINALYSIS, COMPLETE (UACMP) WITH MICROSCOPIC - Abnormal; Notable for the following components:   Color, Urine YELLOW (*)    APPearance HAZY (*)    Glucose, UA >=500 (*)    Hgb urine dipstick SMALL (*)    Leukocytes,Ua LARGE (*)    Bacteria, UA RARE (*)    All other components within normal limits  TROPONIN I (HIGH SENSITIVITY) - Abnormal; Notable for the following components:   Troponin I (High Sensitivity) 579 (*)    All other components within normal limits  TROPONIN I (HIGH SENSITIVITY) - Abnormal; Notable for the  following components:   Troponin I (High Sensitivity) 547 (*)    All other components within normal limits  CBG MONITORING, ED   ____________________________________________  EKG   ____________________________________________  RADIOLOGY I personally viewed and evaluated these images as part of my medical decision making, as well as reviewing the written report by the radiologist.  ED Provider Interpretation: No acute  CT HEAD WO CONTRAST  Result Date: 09/15/2020 CLINICAL DATA:  Severe headache. EXAM:  CT HEAD WITHOUT CONTRAST TECHNIQUE: Contiguous axial images were obtained from the base of the skull through the vertex without intravenous contrast. COMPARISON:  12/19/2015 FINDINGS: Brain: There is no evidence for acute hemorrhage, hydrocephalus, mass lesion, or abnormal extra-axial fluid collection. No definite CT evidence for acute infarction. Diffuse loss of parenchymal volume is consistent with atrophy. Patchy low attenuation in the deep hemispheric and periventricular white matter is nonspecific, but likely reflects chronic microvascular ischemic demyelination. Vascular: No hyperdense vessel or unexpected calcification. Skull: No evidence for fracture. No worrisome lytic or sclerotic lesion. Sinuses/Orbits: Chronic opacification of the right sphenoid sinus noted. Effusion noted inferior mastoid air cells bilaterally. Visualized portions of the globes and intraorbital fat are unremarkable. Other: None. IMPRESSION: 1. No acute intracranial abnormality. 2. Atrophy with chronic small vessel white matter ischemic disease. 3. Chronic right sphenoid sinusitis with bilateral inferior mastoid effusions. Electronically Signed   By: Misty Stanley M.D.   On: 09/15/2020 17:07    ____________________________________________    PROCEDURES  Procedure(s) performed:    Procedures    Medications  acetaminophen (TYLENOL) tablet 650 mg (650 mg Oral Given 09/15/20 2058)  prochlorperazine (COMPAZINE)  injection 5 mg (5 mg Intravenous Given 09/15/20 2100)     ____________________________________________   INITIAL IMPRESSION / ASSESSMENT AND PLAN / ED COURSE  Pertinent labs & imaging results that were available during my care of the patient were reviewed by me and considered in my medical decision making (see chart for details).  Review of the [place] CSRS was performed in accordance of the [place] prior to dispensing any controlled drugs.           Patient's diagnosis is consistent with bad headache.  Patient presented to the emergency department with a headache to the apex of the head.  Patient was recently admitted to the hospital for NSTEMI.  Patient had been placed on Imdur and had developed a headache.  Cardiologist had seen the patient and discontinued the Imdur headache continues.  Patient states that she cannot tactual reproduce the pain but states that it feels like it is directly underneath her finger to the apex of the head.  No visual changes.  No weakness.  No difficulty forming thoughts or words.  Patient was neurologically intact on exam.  Work-up today is reassuring.  Patient's troponin is still elevated but is trending downwards which is consistent with expected trending at this time.  No chest pain.  Patient is already been evaluated by cardiology both at time of admission and in follow-up and is doing well.  Patient was given Tylenol and Compazine which improved the patient's headache.  Return precautions discussed with the patient and her son.  Follow-up with primary care and cardiology as needed.  Patient is given ED precautions to return to the ED for any worsening or new symptoms.     ____________________________________________  FINAL CLINICAL IMPRESSION(S) / ED DIAGNOSES  Final diagnoses:  Bad headache      NEW MEDICATIONS STARTED DURING THIS VISIT:  ED Discharge Orders    None          This chart was dictated using voice recognition software/Dragon.  Despite best efforts to proofread, errors can occur which can change the meaning. Any change was purely unintentional.    Darletta Moll, PA-C 09/15/20 2202    Nance Pear, MD 09/15/20 2216

## 2020-09-18 ENCOUNTER — Emergency Department
Admission: EM | Admit: 2020-09-18 | Discharge: 2020-09-18 | Disposition: A | Discharge status: Home / Self Care | Payer: Medicare HMO | Attending: Emergency Medicine | Admitting: Emergency Medicine

## 2020-09-18 ENCOUNTER — Other Ambulatory Visit: Payer: Self-pay

## 2020-09-18 ENCOUNTER — Emergency Department: Payer: Medicare HMO

## 2020-09-18 DIAGNOSIS — Z79899 Other long term (current) drug therapy: Secondary | ICD-10-CM | POA: Diagnosis not present

## 2020-09-18 DIAGNOSIS — I2510 Atherosclerotic heart disease of native coronary artery without angina pectoris: Secondary | ICD-10-CM | POA: Diagnosis not present

## 2020-09-18 DIAGNOSIS — I129 Hypertensive chronic kidney disease with stage 1 through stage 4 chronic kidney disease, or unspecified chronic kidney disease: Secondary | ICD-10-CM | POA: Diagnosis not present

## 2020-09-18 DIAGNOSIS — Z7982 Long term (current) use of aspirin: Secondary | ICD-10-CM | POA: Insufficient documentation

## 2020-09-18 DIAGNOSIS — M79605 Pain in left leg: Secondary | ICD-10-CM | POA: Diagnosis not present

## 2020-09-18 DIAGNOSIS — M79604 Pain in right leg: Secondary | ICD-10-CM | POA: Diagnosis not present

## 2020-09-18 DIAGNOSIS — E039 Hypothyroidism, unspecified: Secondary | ICD-10-CM | POA: Insufficient documentation

## 2020-09-18 DIAGNOSIS — Z8673 Personal history of transient ischemic attack (TIA), and cerebral infarction without residual deficits: Secondary | ICD-10-CM | POA: Diagnosis not present

## 2020-09-18 DIAGNOSIS — E1122 Type 2 diabetes mellitus with diabetic chronic kidney disease: Secondary | ICD-10-CM | POA: Insufficient documentation

## 2020-09-18 DIAGNOSIS — Z7902 Long term (current) use of antithrombotics/antiplatelets: Secondary | ICD-10-CM | POA: Insufficient documentation

## 2020-09-18 DIAGNOSIS — N184 Chronic kidney disease, stage 4 (severe): Secondary | ICD-10-CM | POA: Insufficient documentation

## 2020-09-18 DIAGNOSIS — Z7984 Long term (current) use of oral hypoglycemic drugs: Secondary | ICD-10-CM | POA: Insufficient documentation

## 2020-09-18 DIAGNOSIS — R519 Headache, unspecified: Secondary | ICD-10-CM | POA: Insufficient documentation

## 2020-09-18 DIAGNOSIS — Z85828 Personal history of other malignant neoplasm of skin: Secondary | ICD-10-CM | POA: Insufficient documentation

## 2020-09-18 LAB — URINALYSIS, COMPLETE (UACMP) WITH MICROSCOPIC
Bacteria, UA: NONE SEEN
Bilirubin Urine: NEGATIVE
Glucose, UA: 150 mg/dL — AB
Hgb urine dipstick: NEGATIVE
Ketones, ur: NEGATIVE mg/dL
Nitrite: NEGATIVE
Protein, ur: NEGATIVE mg/dL
Specific Gravity, Urine: 1.02 (ref 1.005–1.030)
pH: 5 (ref 5.0–8.0)

## 2020-09-18 LAB — COMPREHENSIVE METABOLIC PANEL
ALT: 17 U/L (ref 0–44)
AST: 19 U/L (ref 15–41)
Albumin: 3.3 g/dL — ABNORMAL LOW (ref 3.5–5.0)
Alkaline Phosphatase: 83 U/L (ref 38–126)
Anion gap: 11 (ref 5–15)
BUN: 28 mg/dL — ABNORMAL HIGH (ref 8–23)
CO2: 23 mmol/L (ref 22–32)
Calcium: 8.9 mg/dL (ref 8.9–10.3)
Chloride: 100 mmol/L (ref 98–111)
Creatinine, Ser: 1.63 mg/dL — ABNORMAL HIGH (ref 0.44–1.00)
GFR, Estimated: 29 mL/min — ABNORMAL LOW (ref >60)
Glucose, Bld: 250 mg/dL — ABNORMAL HIGH (ref 70–99)
Potassium: 4.4 mmol/L (ref 3.5–5.1)
Sodium: 134 mmol/L — ABNORMAL LOW (ref 135–145)
Total Bilirubin: 1.1 mg/dL (ref 0.3–1.2)
Total Protein: 6.4 g/dL — ABNORMAL LOW (ref 6.5–8.1)

## 2020-09-18 LAB — CBC WITH DIFFERENTIAL/PLATELET
Abs Immature Granulocytes: 0.07 10*3/uL (ref 0.00–0.07)
Basophils Absolute: 0.1 10*3/uL (ref 0.0–0.1)
Basophils Relative: 1 %
Eosinophils Absolute: 0 10*3/uL (ref 0.0–0.5)
Eosinophils Relative: 0 %
HCT: 38 % (ref 36.0–46.0)
Hemoglobin: 12.9 g/dL (ref 12.0–15.0)
Immature Granulocytes: 1 %
Lymphocytes Relative: 18 %
Lymphs Abs: 1.8 10*3/uL (ref 0.7–4.0)
MCH: 33.1 pg (ref 26.0–34.0)
MCHC: 33.9 g/dL (ref 30.0–36.0)
MCV: 97.4 fL (ref 80.0–100.0)
Monocytes Absolute: 0.7 10*3/uL (ref 0.1–1.0)
Monocytes Relative: 6 %
Neutro Abs: 7.8 10*3/uL — ABNORMAL HIGH (ref 1.7–7.7)
Neutrophils Relative %: 74 %
Platelets: 260 10*3/uL (ref 150–400)
RBC: 3.9 MIL/uL (ref 3.87–5.11)
RDW: 14.5 % (ref 11.5–15.5)
WBC: 10.4 10*3/uL (ref 4.0–10.5)
nRBC: 0 % (ref 0.0–0.2)

## 2020-09-18 LAB — MAGNESIUM: Magnesium: 1.4 mg/dL — ABNORMAL LOW (ref 1.7–2.4)

## 2020-09-18 MED ORDER — PROCHLORPERAZINE EDISYLATE 10 MG/2ML IJ SOLN
10.0000 mg | Freq: Once | INTRAMUSCULAR | Status: AC
  Administered 2020-09-18: 10 mg via INTRAVENOUS
  Filled 2020-09-18: qty 2

## 2020-09-18 MED ORDER — SODIUM CHLORIDE 0.9 % IV BOLUS
500.0000 mL | Freq: Once | INTRAVENOUS | Status: AC
Start: 2020-09-18 — End: 2020-09-18
  Administered 2020-09-18: 500 mL via INTRAVENOUS

## 2020-09-18 MED ORDER — ACETAMINOPHEN 325 MG PO TABS
650.0000 mg | ORAL_TABLET | Freq: Once | ORAL | Status: AC
  Administered 2020-09-18: 650 mg via ORAL
  Filled 2020-09-18: qty 2

## 2020-09-18 MED ORDER — MAGNESIUM SULFATE 2 GM/50ML IV SOLN
2.0000 g | Freq: Once | INTRAVENOUS | Status: AC
  Administered 2020-09-18: 2 g via INTRAVENOUS
  Filled 2020-09-18: qty 50

## 2020-09-18 MED ORDER — MAG-OXIDE 200 MG PO TABS: 2.0000 | ORAL_TABLET | Freq: Two times a day (BID) | ORAL | 0 refills | Status: DC

## 2020-09-18 NOTE — ED Provider Notes (Signed)
[facility] Emergency Department Provider Note  ____________________________________________  Time seen: Approximately 3:42 PM  I have reviewed the triage vital signs and the nursing notes.   HISTORY  Chief Complaint Headache    HPI Erika Ochoa is a 87 y.o. female who presents the emergency department complaining of ongoing headache, bilateral leg pain.  Patient was seen by myself in this department 3 days ago.  Patient was here for headache at that time.  Work-up at that time was reassuring.  Patient has been given Compazine with good relief of the headache.  At that time patient was stable for discharge.  Headache has returned, it is in the same spot at the apex of the head.  Patient has had no recent trauma.  No visual changes.  No unilateral weakness.  No speech changes.  Patient is now also complaining of bilateral leg pain in the thighs.  This is not affected ambulation.  There is no reported unilateral weakness in the legs.  No loss of sensation.  No reported erythema, edema or ecchymosis.  No other complaints to include fevers or chills, nasal congestion, sore throat, cough, shortness of breath, chest pain, abdominal pain, nausea vomiting, diarrhea or constipation.  No urinary changes.  10 days ago patient was admitted to the hospital for an NSTEMI.  Patient had been placed on anticoagulation while here but get it within the bleeding risk had been discharged without any anticoagulation.  Patient is on statin and beta-blocker.  She was on Imdur which was attributed to the initial headache.  This has been stopped and when headache cannot improved she had presented to the emergency department for evaluation 3 days ago.          Past Medical History:  Diagnosis Date  . Abnormal mammogram, unspecified   . Actinic keratosis   . Breast screening, unspecified   . Diabetes ([place])   . Hypertension   . Mammographic microcalcification   . Squamous cell carcinoma of  skin 06/14/2019   Left superior forehead. Well differentiated. Tx: EDC  . Squamous cell carcinoma of skin 09/13/2019   L of midline med forehead at hairline SCCIS   . Vaginal pessary present     Patient Active Problem List   Diagnosis Date Noted  . Hypomagnesemia   . NSTEMI (non-ST elevated myocardial infarction) ([place]) 09/08/2020  . CKD stage 4 due to type 2 diabetes mellitus ([place]) 09/08/2020  . Hyponatremia 09/08/2020  . Symptomatic anemia 08/30/2019  . Gout 08/30/2019  . Elevated troponin 08/30/2019  . GIB (gastrointestinal bleeding) 06/10/2018  . Cellulitis of leg, left 03/07/2018  . Bilateral edema of lower extremity 03/07/2018  . Bilateral lower extremity pain 03/07/2018  . Cystocele, midline 08/12/2016  . Uterovaginal prolapse, incomplete 08/12/2016  . History of transient ischemic attack (TIA) 02/05/2016  . TIA (transient ischemic attack) 12/19/2015  . Acute kidney injury superimposed on CKD ([place]) 12/19/2015  . Primary osteoarthritis of both first carpometacarpal joints 07/24/2015  . B12 deficiency 02/25/2015  . SVT (supraventricular tachycardia) ([place]) 07/11/2014  . Cervical pain 07/11/2014  . CAD (coronary artery disease) 07/11/2014  . Benign essential HTN 07/11/2014  . HLD (hyperlipidemia) 07/11/2014  . Type 2 diabetes mellitus with hyperlipidemia ([place]) 07/11/2014  . Essential hypertension 07/11/2014  . Hypothyroidism 07/11/2014    Past Surgical History:  Procedure Laterality Date  . BREAST BIOPSY Left 2013   stereo calcs  . BREAST EXCISIONAL BIOPSY Left    "years ago"-benign  . ESOPHAGOGASTRODUODENOSCOPY Left 06/12/2018  Procedure: ESOPHAGOGASTRODUODENOSCOPY (EGD);  Surgeon: Virgel Manifold, MD;  Location: [facility] ENDOSCOPY;  Service: Endoscopy;  Laterality: Left;  . ESOPHAGOGASTRODUODENOSCOPY N/A 08/31/2019   Procedure: ESOPHAGOGASTRODUODENOSCOPY (EGD);  Surgeon: Toledo, Benay Pike, MD;  Location: [facility] ENDOSCOPY;  Service: Gastroenterology;  Laterality: N/A;   . SKIN LESION EXCISION      Prior to Admission medications   Medication Sig Start Date End Date Taking? Authorizing Provider  Magnesium Oxide (MAG-OXIDE) 200 MG TABS Take 2 tablets (400 mg total) by mouth 2 (two) times daily. 09/18/20  Yes Sacha Topor, Charline Bills, PA-C  allopurinol (ZYLOPRIM) 100 MG tablet Take 100 mg by mouth daily. 08/23/19   [provider]  aspirin EC 81 MG EC tablet Take 1 tablet (81 mg total) by mouth daily. Swallow whole. 09/11/20   Loletha Grayer, MD  atorvastatin (LIPITOR) 40 MG tablet Take 1 tablet (40 mg total) by mouth daily. 09/10/20 09/10/21  Loletha Grayer, MD  clopidogrel (PLAVIX) 75 MG tablet Take 1 tablet (75 mg total) by mouth daily. 09/11/20   Loletha Grayer, MD  cyanocobalamin (,VITAMIN B-12,) 1000 MCG/ML injection Inject 1,000 mcg into the muscle every 30 (thirty) days.  10/25/15   [provider]  diltiazem (CARDIZEM CD) 120 MG 24 hr capsule Take 1 capsule (120 mg total) by mouth daily. 09/11/20   Loletha Grayer, MD  donepezil (ARICEPT) 5 MG tablet Take 1 tablet by mouth every evening. 07/20/20   [provider]  ferrous sulfate 325 (65 FE) MG EC tablet Take 1 tablet (325 mg total) by mouth 2 (two) times daily. 08/31/19 08/30/20  Lorella Nimrod, MD  glipiZIDE (GLUCOTROL XL) 5 MG 24 hr tablet Take 5 mg by mouth 2 (two) times daily.  07/01/18   [provider]  isosorbide mononitrate (IMDUR) 30 MG 24 hr tablet Take 1 tablet (30 mg total) by mouth daily. 09/11/20   Loletha Grayer, MD  levothyroxine (SYNTHROID, LEVOTHROID) 50 MCG tablet Take 50 mcg by mouth daily.     [provider]  metoprolol tartrate (LOPRESSOR) 50 MG tablet Take 1 tablet (50 mg total) by mouth 2 (two) times daily. 09/10/20   Loletha Grayer, MD  nitroGLYCERIN (NITROSTAT) 0.4 MG SL tablet Place 1 tablet (0.4 mg total) under the tongue every 5 (five) minutes x 3 doses as needed for chest pain (max 3). 09/10/20   Loletha Grayer, MD  pantoprazole (PROTONIX) 40  MG tablet Take 1 tablet (40 mg total) by mouth daily. 08/31/19 08/30/20  Lorella Nimrod, MD  pioglitazone (ACTOS) 30 MG tablet Take 30 mg by mouth daily. 07/12/19   [provider]  TRADJENTA 5 MG TABS tablet Take 5 mg by mouth daily.  07/01/18   [provider]  Vitamin D, Ergocalciferol, (DRISDOL) 1.25 MG (50000 UT) CAPS capsule Take 50,000 Units by mouth every Saturday.  08/04/18   [provider]    Allergies Oxycodone hcl, Oxycodone-aspirin, Augmentin [amoxicillin-pot clavulanate], Codeine, Etodolac, Iodine, Levaquin [levofloxacin in d5w], Levofloxacin, Metronidazole, Ultram [tramadol], and Sulfa antibiotics  Family History  Problem Relation Age of Onset  . Cancer Mother   . Breast cancer Neg Hx     Social History Social History   Tobacco Use  . Smoking status: Never Smoker  . Smokeless tobacco: Never Used  Vaping Use  . Vaping Use: Never used  Substance Use Topics  . Alcohol use: No    Alcohol/week: 0.0 standard drinks  . Drug use: No     Review of Systems  Constitutional: No fever/chills Eyes: No  visual changes. No discharge ENT: No upper respiratory complaints. Cardiovascular: no chest pain. Respiratory: no cough. No SOB. Gastrointestinal: No abdominal pain.  No nausea, no vomiting.  No diarrhea.  No constipation. Musculoskeletal: Positive for bilateral thigh pain Skin: Negative for rash, abrasions, lacerations, ecchymosis. Neurological: Positive for headache to the apex of the head.  Denies focal weakness or numbness.  10 System ROS otherwise negative.  ____________________________________________   PHYSICAL EXAM:  VITAL SIGNS: ED Triage Vitals  Enc Vitals Group     BP 09/18/20 1403 (!) 153/74     Pulse Rate 09/18/20 1403 70     Resp 09/18/20 1403 18     Temp 09/18/20 1403 98.3 F (36.8 C)     Temp Source 09/18/20 1403 Oral     SpO2 09/18/20 1403 94 %     Weight 09/18/20 1408 152 lb 1.9 oz (69 kg)     Height 09/18/20 1408 5\' 7"   (1.702 m)     Head Circumference --      Peak Flow --      Pain Score --      Pain Loc --      Pain Edu? --      Excl. in [place]? --      Constitutional: Alert and oriented. Well appearing and in no acute distress. Eyes: Conjunctivae are normal. PERRL. EOMI. Head: Atraumatic.  No visible external abnormality.  No edema, ecchymosis.  No palpable findings or lesions. ENT:      Ears:       Nose: No congestion/rhinnorhea.      Mouth/Throat: Mucous membranes are moist.  Neck: No stridor.  No cervical spine tenderness to palpation.  Neck is supple full range of motion  Cardiovascular: Normal rate, regular rhythm. Normal S1 and S2.  Good peripheral circulation. Respiratory: Normal respiratory effort without tachypnea or retractions. Lungs CTAB. Good air entry to the bases with no decreased or absent breath sounds. Gastrointestinal: Bowel sounds 4 quadrants. Soft and nontender to palpation. No guarding or rigidity. No palpable masses. No distention. No CVA tenderness. Musculoskeletal: Full range of motion to all extremities. No gross deformities appreciated.  Visualization of the bilateral lower extremities reveals no gross erythema, edema, ecchymosis.  Patient is diffusely tender to palpation over bilateral thighs without palpable abnormality.  Dorsalis pedis pulse and sensation intact distally. Neurologic:  Normal speech and language. No gross focal neurologic deficits are appreciated.  Cranial nerves II through XII grossly intact.  Equal grip strength upper extremities. Skin:  Skin is warm, dry and intact. No rash noted. Psychiatric: Mood and affect are normal. Speech and behavior are normal. Patient exhibits appropriate insight and judgement.   ____________________________________________   LABS (all labs ordered are listed, but only abnormal results are displayed)  Labs Reviewed  COMPREHENSIVE METABOLIC PANEL - Abnormal; Notable for the following components:      Result Value   Sodium  134 (*)    Glucose, Bld 250 (*)    BUN 28 (*)    Creatinine, Ser 1.63 (*)    Total Protein 6.4 (*)    Albumin 3.3 (*)    GFR, Estimated 29 (*)    All other components within normal limits  CBC WITH DIFFERENTIAL/PLATELET - Abnormal; Notable for the following components:   Neutro Abs 7.8 (*)    All other components within normal limits  URINALYSIS, COMPLETE (UACMP) WITH MICROSCOPIC - Abnormal; Notable for the following components:   Color, Urine YELLOW (*)    APPearance HAZY (*)  Glucose, UA 150 (*)    Leukocytes,Ua TRACE (*)    All other components within normal limits  MAGNESIUM - Abnormal; Notable for the following components:   Magnesium 1.4 (*)    All other components within normal limits   ____________________________________________  EKG   ____________________________________________  RADIOLOGY   US Venous Img Lower Bilateral  Result Date: 09/18/2020 CLINICAL DATA:  Bilateral thigh pain. EXAM: BILATERAL LOWER EXTREMITY VENOUS DOPPLER ULTRASOUND TECHNIQUE: Gray-scale sonography with graded compression, as well as color Doppler and duplex ultrasound were performed to evaluate the lower extremity deep venous systems from the level of the common femoral vein and including the common femoral, femoral, profunda femoral, popliteal and calf veins including the posterior tibial, peroneal and gastrocnemius veins when visible. The superficial great saphenous vein was also interrogated. Spectral Doppler was utilized to evaluate flow at rest and with distal augmentation maneuvers in the common femoral, femoral and popliteal veins. COMPARISON:  December 30, 2017. FINDINGS: RIGHT LOWER EXTREMITY Common Femoral Vein: No evidence of thrombus. Normal compressibility, respiratory phasicity and response to augmentation. Saphenofemoral Junction: No evidence of thrombus. Normal compressibility and flow on color Doppler imaging. Profunda Femoral Vein: No evidence of thrombus. Normal compressibility and  flow on color Doppler imaging. Femoral Vein: No evidence of thrombus. Normal compressibility, respiratory phasicity and response to augmentation. Popliteal Vein: No evidence of thrombus. Normal compressibility, respiratory phasicity and response to augmentation. Calf Veins: No evidence of thrombus. Normal compressibility and flow on color Doppler imaging. Superficial Great Saphenous Vein: No evidence of thrombus. Normal compressibility. Venous Reflux:  None. LEFT LOWER EXTREMITY Common Femoral Vein: No evidence of thrombus. Normal compressibility, respiratory phasicity and response to augmentation. Saphenofemoral Junction: No evidence of thrombus. Normal compressibility and flow on color Doppler imaging. Profunda Femoral Vein: No evidence of thrombus. Normal compressibility and flow on color Doppler imaging. Femoral Vein: No evidence of thrombus. Normal compressibility, respiratory phasicity and response to augmentation. Popliteal Vein: No evidence of thrombus. Normal compressibility, respiratory phasicity and response to augmentation. Calf Veins: No evidence of thrombus. Normal compressibility and flow on color Doppler imaging. Superficial Great Saphenous Vein: No evidence of thrombus. Normal compressibility. Venous Reflux:  None. IMPRESSION: No evidence of deep venous thrombosis in either lower extremity. Electronically Signed   By: Margaretha Sheffield MD   On: 09/18/2020 18:22    ____________________________________________    PROCEDURES  Procedure(s) performed:    Procedures    Medications  prochlorperazine (COMPAZINE) injection 10 mg (10 mg Intravenous Given 09/18/20 1649)  acetaminophen (TYLENOL) tablet 650 mg (650 mg Oral Given 09/18/20 1647)  sodium chloride 0.9 % bolus 500 mL (0 mLs Intravenous Stopped 09/18/20 1845)  magnesium sulfate IVPB 2 g 50 mL (0 g Intravenous Stopped 09/18/20 2129)     ____________________________________________   INITIAL IMPRESSION / ASSESSMENT AND PLAN / ED  COURSE  Pertinent labs & imaging results that were available during my care of the patient were reviewed by me and considered in my medical decision making (see chart for details).  Review of the [place] CSRS was performed in accordance of the [place] prior to dispensing any controlled drugs.           Patient's diagnosis is consistent with headache, hypomagnesemia.  Patient presented to the emergency department complaining of headache.  I had seen the patient 3 days ago for a headache.  Headache started after being discharged from an NSTEMI that occurred 10 days ago.  Patient been placed on several cardiac medications including Imdur which was  felt to have caused the patient's original headache.  Patient had good relief after the last visit with a reassuring work-up.  Patient been given Compazine.  Headache slowly return was in the same location.  Patient is still neurologically intact.  She was also complaining of some bilateral thigh pain today with no other complaints.  Labs were reassuring with most labs being at patient's baseline.  She was found to be in a state of hypomagnesemia.  This was replaced IV and patient will have oral supplementation at home.  Patient had good success with reducing her symptoms with Compazine again.  I will order a prescription for Compazine and magnesium for at home use.  Follow-up with primary care.  Concerning signs and symptoms are discussed with the patient and her daughter. Patient is given ED precautions to return to the ED for any worsening or new symptoms.     ____________________________________________  FINAL CLINICAL IMPRESSION(S) / ED DIAGNOSES  Final diagnoses:  Acute nonintractable headache, unspecified headache type      NEW MEDICATIONS STARTED DURING THIS VISIT:  ED Discharge Orders         Ordered    Magnesium Oxide (MAG-OXIDE) 200 MG TABS  2 times daily        09/18/20 2133              This chart was dictated using voice  recognition software/Dragon. Despite best efforts to proofread, errors can occur which can change the meaning. Any change was purely unintentional.    Darletta Moll, PA-C 09/19/20 0026    Lavonia Drafts, MD 09/23/20 [phone]

## 2020-09-18 NOTE — ED Triage Notes (Signed)
Pt to ED POV for chief complaint of headaches since last being seen on 4/10 for same.  Pt disoriented  And HOH, but does report pain to head. Daughter reports pt has also been c.o pain to BLE between knees and hips, no swelling or discoloration noted.  Pt recently had NSTEMI

## 2020-10-17 ENCOUNTER — Emergency Department
Admission: EM | Admit: 2020-10-17 | Discharge: 2020-10-17 | Disposition: A | Discharge status: Home / Self Care | Payer: Medicare HMO | Attending: Emergency Medicine | Admitting: Emergency Medicine

## 2020-10-17 ENCOUNTER — Other Ambulatory Visit: Payer: Self-pay

## 2020-10-17 ENCOUNTER — Emergency Department: Payer: Medicare HMO

## 2020-10-17 DIAGNOSIS — I2510 Atherosclerotic heart disease of native coronary artery without angina pectoris: Secondary | ICD-10-CM | POA: Insufficient documentation

## 2020-10-17 DIAGNOSIS — E1122 Type 2 diabetes mellitus with diabetic chronic kidney disease: Secondary | ICD-10-CM | POA: Insufficient documentation

## 2020-10-17 DIAGNOSIS — Z85828 Personal history of other malignant neoplasm of skin: Secondary | ICD-10-CM | POA: Diagnosis not present

## 2020-10-17 DIAGNOSIS — Y9301 Activity, walking, marching and hiking: Secondary | ICD-10-CM | POA: Insufficient documentation

## 2020-10-17 DIAGNOSIS — I129 Hypertensive chronic kidney disease with stage 1 through stage 4 chronic kidney disease, or unspecified chronic kidney disease: Secondary | ICD-10-CM | POA: Diagnosis not present

## 2020-10-17 DIAGNOSIS — E039 Hypothyroidism, unspecified: Secondary | ICD-10-CM | POA: Insufficient documentation

## 2020-10-17 DIAGNOSIS — Z8673 Personal history of transient ischemic attack (TIA), and cerebral infarction without residual deficits: Secondary | ICD-10-CM | POA: Diagnosis not present

## 2020-10-17 DIAGNOSIS — Z79899 Other long term (current) drug therapy: Secondary | ICD-10-CM | POA: Insufficient documentation

## 2020-10-17 DIAGNOSIS — W1830XA Fall on same level, unspecified, initial encounter: Secondary | ICD-10-CM | POA: Diagnosis not present

## 2020-10-17 DIAGNOSIS — N184 Chronic kidney disease, stage 4 (severe): Secondary | ICD-10-CM | POA: Insufficient documentation

## 2020-10-17 DIAGNOSIS — S0003XA Contusion of scalp, initial encounter: Secondary | ICD-10-CM | POA: Insufficient documentation

## 2020-10-17 DIAGNOSIS — Z7982 Long term (current) use of aspirin: Secondary | ICD-10-CM | POA: Insufficient documentation

## 2020-10-17 DIAGNOSIS — Z7902 Long term (current) use of antithrombotics/antiplatelets: Secondary | ICD-10-CM | POA: Diagnosis not present

## 2020-10-17 DIAGNOSIS — W19XXXA Unspecified fall, initial encounter: Secondary | ICD-10-CM

## 2020-10-17 DIAGNOSIS — Z7984 Long term (current) use of oral hypoglycemic drugs: Secondary | ICD-10-CM | POA: Diagnosis not present

## 2020-10-17 DIAGNOSIS — S0990XA Unspecified injury of head, initial encounter: Secondary | ICD-10-CM | POA: Diagnosis present

## 2020-10-17 DIAGNOSIS — Y92019 Unspecified place in single-family (private) house as the place of occurrence of the external cause: Secondary | ICD-10-CM | POA: Insufficient documentation

## 2020-10-17 LAB — CBC
HCT: 36 % (ref 36.0–46.0)
Hemoglobin: 12.3 g/dL (ref 12.0–15.0)
MCH: 33.6 pg (ref 26.0–34.0)
MCHC: 34.2 g/dL (ref 30.0–36.0)
MCV: 98.4 fL (ref 80.0–100.0)
Platelets: 224 10*3/uL (ref 150–400)
RBC: 3.66 MIL/uL — ABNORMAL LOW (ref 3.87–5.11)
RDW: 15.1 % (ref 11.5–15.5)
WBC: 8.8 10*3/uL (ref 4.0–10.5)
nRBC: 0 % (ref 0.0–0.2)

## 2020-10-17 LAB — BASIC METABOLIC PANEL
Anion gap: 11 (ref 5–15)
BUN: 32 mg/dL — ABNORMAL HIGH (ref 8–23)
CO2: 22 mmol/L (ref 22–32)
Calcium: 9.1 mg/dL (ref 8.9–10.3)
Chloride: 102 mmol/L (ref 98–111)
Creatinine, Ser: 1.66 mg/dL — ABNORMAL HIGH (ref 0.44–1.00)
GFR, Estimated: 28 mL/min — ABNORMAL LOW (ref >60)
Glucose, Bld: 243 mg/dL — ABNORMAL HIGH (ref 70–99)
Potassium: 3.8 mmol/L (ref 3.5–5.1)
Sodium: 135 mmol/L (ref 135–145)

## 2020-10-17 LAB — URINALYSIS, COMPLETE (UACMP) WITH MICROSCOPIC
Bacteria, UA: NONE SEEN
Bilirubin Urine: NEGATIVE
Glucose, UA: NEGATIVE mg/dL
Hgb urine dipstick: NEGATIVE
Ketones, ur: NEGATIVE mg/dL
Nitrite: NEGATIVE
Protein, ur: NEGATIVE mg/dL
Specific Gravity, Urine: 1.009 (ref 1.005–1.030)
pH: 5 (ref 5.0–8.0)

## 2020-10-17 NOTE — ED Triage Notes (Signed)
Pt to ED POV for fall today, pt states unsure what made her fall. C/o pain to head, states she thinks she takes blood thinners but is unsure.  Pt alert. Disoriented to time.

## 2020-10-17 NOTE — ED Provider Notes (Signed)
[facility] Emergency Department Provider Note   ____________________________________________   Event Date/Time   First MD Initiated Contact with Patient 10/17/20 1845     (approximate)  I have reviewed the triage vital signs and the nursing notes.   HISTORY  Chief Complaint Fall    HPI Erika Ochoa is a 87 y.o. female with past medical history of hypertension, hyperlipidemia, diabetes, CAD, and CKD who presents to the ED for fall.  Patient states that she was walking in her house when she suddenly fell to the ground, striking her head on the TV.  She states she is not sure what caused her to fall, she denies losing consciousness.  She did not feel lightheaded or dizzy and denies any associated chest pain or shortness of breath.  She is not sure if she lost her balance or tripped on something.  She currently complains of pain and a knot on the back of her head, states she does not take any blood thinners.  She denies any vision changes, speech changes, numbness, or weakness.  She is otherwise been feeling well recently with no fevers, cough, dysuria, vomiting, or abdominal pain.        Past Medical History:  Diagnosis Date  . Abnormal mammogram, unspecified   . Actinic keratosis   . Breast screening, unspecified   . Diabetes ([place])   . Hypertension   . Mammographic microcalcification   . Squamous cell carcinoma of skin 06/14/2019   Left superior forehead. Well differentiated. Tx: EDC  . Squamous cell carcinoma of skin 09/13/2019   L of midline med forehead at hairline SCCIS   . Vaginal pessary present     Patient Active Problem List   Diagnosis Date Noted  . Hypomagnesemia   . NSTEMI (non-ST elevated myocardial infarction) ([place]) 09/08/2020  . CKD stage 4 due to type 2 diabetes mellitus ([place]) 09/08/2020  . Hyponatremia 09/08/2020  . Symptomatic anemia 08/30/2019  . Gout 08/30/2019  . Elevated troponin 08/30/2019  . GIB (gastrointestinal  bleeding) 06/10/2018  . Cellulitis of leg, left 03/07/2018  . Bilateral edema of lower extremity 03/07/2018  . Bilateral lower extremity pain 03/07/2018  . Cystocele, midline 08/12/2016  . Uterovaginal prolapse, incomplete 08/12/2016  . History of transient ischemic attack (TIA) 02/05/2016  . TIA (transient ischemic attack) 12/19/2015  . Acute kidney injury superimposed on CKD ([place]) 12/19/2015  . Primary osteoarthritis of both first carpometacarpal joints 07/24/2015  . B12 deficiency 02/25/2015  . SVT (supraventricular tachycardia) ([place]) 07/11/2014  . Cervical pain 07/11/2014  . CAD (coronary artery disease) 07/11/2014  . Benign essential HTN 07/11/2014  . HLD (hyperlipidemia) 07/11/2014  . Type 2 diabetes mellitus with hyperlipidemia ([place]) 07/11/2014  . Essential hypertension 07/11/2014  . Hypothyroidism 07/11/2014    Past Surgical History:  Procedure Laterality Date  . BREAST BIOPSY Left 2013   stereo calcs  . BREAST EXCISIONAL BIOPSY Left    "years ago"-benign  . ESOPHAGOGASTRODUODENOSCOPY Left 06/12/2018   Procedure: ESOPHAGOGASTRODUODENOSCOPY (EGD);  Surgeon: Virgel Manifold, MD;  Location: [facility] ENDOSCOPY;  Service: Endoscopy;  Laterality: Left;  . ESOPHAGOGASTRODUODENOSCOPY N/A 08/31/2019   Procedure: ESOPHAGOGASTRODUODENOSCOPY (EGD);  Surgeon: Toledo, Benay Pike, MD;  Location: [facility] ENDOSCOPY;  Service: Gastroenterology;  Laterality: N/A;  . SKIN LESION EXCISION      Prior to Admission medications   Medication Sig Start Date End Date Taking? Authorizing Provider  allopurinol (ZYLOPRIM) 100 MG tablet Take 100 mg by mouth daily. 08/23/19   [provider]  aspirin EC 81 MG EC tablet Take 1 tablet (81 mg total) by mouth daily. Swallow whole. 09/11/20   Loletha Grayer, MD  atorvastatin (LIPITOR) 40 MG tablet Take 1 tablet (40 mg total) by mouth daily. 09/10/20 09/10/21  Loletha Grayer, MD  clopidogrel (PLAVIX) 75 MG tablet Take 1 tablet (75 mg total) by mouth daily.  09/11/20   Loletha Grayer, MD  cyanocobalamin (,VITAMIN B-12,) 1000 MCG/ML injection Inject 1,000 mcg into the muscle every 30 (thirty) days.  10/25/15   [provider]  diltiazem (CARDIZEM CD) 120 MG 24 hr capsule Take 1 capsule (120 mg total) by mouth daily. 09/11/20   Loletha Grayer, MD  donepezil (ARICEPT) 5 MG tablet Take 1 tablet by mouth every evening. 07/20/20   [provider]  ferrous sulfate 325 (65 FE) MG EC tablet Take 1 tablet (325 mg total) by mouth 2 (two) times daily. 08/31/19 08/30/20  Lorella Nimrod, MD  glipiZIDE (GLUCOTROL XL) 5 MG 24 hr tablet Take 5 mg by mouth 2 (two) times daily.  07/01/18   [provider]  isosorbide mononitrate (IMDUR) 30 MG 24 hr tablet Take 1 tablet (30 mg total) by mouth daily. 09/11/20   Loletha Grayer, MD  levothyroxine (SYNTHROID, LEVOTHROID) 50 MCG tablet Take 50 mcg by mouth daily.     [provider]  Magnesium Oxide (MAG-OXIDE) 200 MG TABS Take 2 tablets (400 mg total) by mouth 2 (two) times daily. 09/18/20   Cuthriell, Charline Bills, PA-C  metoprolol tartrate (LOPRESSOR) 50 MG tablet Take 1 tablet (50 mg total) by mouth 2 (two) times daily. 09/10/20   Loletha Grayer, MD  nitroGLYCERIN (NITROSTAT) 0.4 MG SL tablet Place 1 tablet (0.4 mg total) under the tongue every 5 (five) minutes x 3 doses as needed for chest pain (max 3). 09/10/20   Loletha Grayer, MD  pantoprazole (PROTONIX) 40 MG tablet Take 1 tablet (40 mg total) by mouth daily. 08/31/19 08/30/20  Lorella Nimrod, MD  pioglitazone (ACTOS) 30 MG tablet Take 30 mg by mouth daily. 07/12/19   [provider]  TRADJENTA 5 MG TABS tablet Take 5 mg by mouth daily.  07/01/18   [provider]  Vitamin D, Ergocalciferol, (DRISDOL) 1.25 MG (50000 UT) CAPS capsule Take 50,000 Units by mouth every Saturday.  08/04/18   [provider]    Allergies Oxycodone hcl, Oxycodone-aspirin, Augmentin [amoxicillin-pot clavulanate], Codeine, Etodolac, Iodine,  Levaquin [levofloxacin in d5w], Levofloxacin, Metronidazole, Ultram [tramadol], and Sulfa antibiotics  Family History  Problem Relation Age of Onset  . Cancer Mother   . Breast cancer Neg Hx     Social History Social History   Tobacco Use  . Smoking status: Never Smoker  . Smokeless tobacco: Never Used  Vaping Use  . Vaping Use: Never used  Substance Use Topics  . Alcohol use: No    Alcohol/week: 0.0 standard drinks  . Drug use: No    Review of Systems  Constitutional: No fever/chills Eyes: No visual changes. ENT: No sore throat. Cardiovascular: Denies chest pain. Respiratory: Denies shortness of breath. Gastrointestinal: No abdominal pain.  No nausea, no vomiting.  No diarrhea.  No constipation. Genitourinary: Negative for dysuria. Musculoskeletal: Negative for back pain. Skin: Negative for rash. Neurological: Positive for headaches, negative for focal weakness or numbness.  ____________________________________________   PHYSICAL EXAM:  VITAL SIGNS: ED Triage Vitals  Enc Vitals Group     BP 10/17/20 1734 (!) 159/70     Pulse Rate 10/17/20 1734 62  Resp 10/17/20 1742 20     Temp 10/17/20 1734 98.4 F (36.9 C)     Temp Source 10/17/20 1734 Oral     SpO2 10/17/20 1734 96 %     Weight 10/17/20 1740 152 lb 1.9 oz (69 kg)     Height 10/17/20 1740 5\' 7"  (1.702 m)     Head Circumference --      Peak Flow --      Pain Score 10/17/20 1740 7     Pain Loc --      Pain Edu? --      Excl. in [place]? --     Constitutional: Alert and oriented to person, place, time, and situation. Eyes: Conjunctivae are normal. Head: Small scalp hematoma with no associated lacerations or bony step-offs. Nose: No congestion/rhinnorhea. Mouth/Throat: Mucous membranes are moist. Neck: Normal ROM, no midline cervical spine tenderness to palpation. Cardiovascular: Normal rate, regular rhythm. Grossly normal heart sounds. Respiratory: Normal respiratory effort.  No retractions. Lungs  CTAB. Gastrointestinal: Soft and nontender. No distention. Genitourinary: deferred Musculoskeletal: No lower extremity tenderness nor edema. Neurologic:  Normal speech and language. No gross focal neurologic deficits are appreciated. Skin:  Skin is warm, dry and intact. No rash noted. Psychiatric: Mood and affect are normal. Speech and behavior are normal.  ____________________________________________   LABS (all labs ordered are listed, but only abnormal results are displayed)  Labs Reviewed  BASIC METABOLIC PANEL - Abnormal; Notable for the following components:      Result Value   Glucose, Bld 243 (*)    BUN 32 (*)    Creatinine, Ser 1.66 (*)    GFR, Estimated 28 (*)    All other components within normal limits  CBC - Abnormal; Notable for the following components:   RBC 3.66 (*)    All other components within normal limits  URINALYSIS, COMPLETE (UACMP) WITH MICROSCOPIC - Abnormal; Notable for the following components:   Color, Urine STRAW (*)    APPearance CLEAR (*)    Leukocytes,Ua SMALL (*)    All other components within normal limits  URINE CULTURE   ____________________________________________  EKG  ED ECG REPORT I, Blake Divine, the attending physician, personally viewed and interpreted this ECG.   Date: 10/17/2020  EKG Time: 17:37  Rate: 65  Rhythm: normal sinus rhythm  Axis: Normal  Intervals:right bundle branch block  ST&T Change: None   PROCEDURES  Procedure(s) performed (including Critical Care):  Procedures   ____________________________________________   INITIAL IMPRESSION / ASSESSMENT AND PLAN / ED COURSE       87 year old female with past medical history of hypertension, hyperlipidemia, diabetes, CAD, and CKD who presents to the ED with unwitnessed fall at home, striking her head.  She has a small scalp hematoma but CT head reviewed by me with no obvious hemorrhage, negative for acute process per radiology.  We will also check CT of her  cervical spine, although she has no tenderness on exam.  She has no focal neurologic deficits and appears to be at her baseline mental status.  EKG shows no evidence of arrhythmia or ischemia and labs thus far are unremarkable, UA is pending.  UA shows small amount of WBCs, given patient's lack of symptoms we will hold off on treatment but will send urine for culture.  CT cervical spine is negative for acute process.  Patient remains asymptomatic here in the ED and is appropriate for discharge home with PCP follow-up.  She was counseled to return to the ED  for new or worsening symptoms, patient agrees with plan.      ____________________________________________   FINAL CLINICAL IMPRESSION(S) / ED DIAGNOSES  Final diagnoses:  Fall, initial encounter  Hematoma of scalp, initial encounter     ED Discharge Orders    None       Note:  This document was prepared using Dragon voice recognition software and may include unintentional dictation errors.   Blake Divine, MD 10/17/20 2146

## 2020-10-19 LAB — URINE CULTURE: Culture: NO GROWTH

## 2020-12-02 ENCOUNTER — Ambulatory Visit: Payer: Medicare HMO | Admitting: Obstetrics & Gynecology

## 2020-12-02 ENCOUNTER — Other Ambulatory Visit: Payer: Self-pay

## 2020-12-02 ENCOUNTER — Encounter: Payer: Self-pay | Admitting: Obstetrics & Gynecology

## 2020-12-02 VITALS — BP 120/80 | Ht 66.0 in | Wt 143.0 lb

## 2020-12-02 DIAGNOSIS — N8111 Cystocele, midline: Secondary | ICD-10-CM | POA: Diagnosis not present

## 2020-12-02 DIAGNOSIS — N812 Incomplete uterovaginal prolapse: Secondary | ICD-10-CM | POA: Diagnosis not present

## 2020-12-02 NOTE — Progress Notes (Signed)
HPI:      [name] is a 87 y.o. G2P0011 who presents today for her pessary follow up and examination related to her pelvic floor weakening.  Pt reports tolerating the pessary well with no vaginal bleeding and no vaginal discharge.  Symptoms of pelvic floor weakening have greatly improved. She is voiding and defecating without difficulty. She currently has a Ring #4 pessary.  PMHx: She  has a past medical history of Abnormal mammogram, unspecified, Actinic keratosis, Breast screening, unspecified, Diabetes ([place]), Hypertension, Mammographic microcalcification, Squamous cell carcinoma of skin (06/14/2019), Squamous cell carcinoma of skin (09/13/2019), and Vaginal pessary present. Also,  has a past surgical history that includes Skin lesion excision; Breast biopsy (Left, 2013); Breast excisional biopsy (Left); Esophagogastroduodenoscopy (Left, 06/12/2018); and Esophagogastroduodenoscopy (N/A, 08/31/2019)., family history includes Cancer in her mother.,  reports that she has never smoked. She has never used smokeless tobacco. She reports that she does not drink alcohol and does not use drugs.  She has a current medication list which includes the following prescription(s): allopurinol, aspirin, atorvastatin, clopidogrel, cyanocobalamin, diltiazem, donepezil, glipizide, isosorbide mononitrate, levothyroxine, mag-oxide, metoprolol tartrate, nitroglycerin, pioglitazone, tradjenta, vitamin d (ergocalciferol), ferrous sulfate, and pantoprazole. Also, is allergic to oxycodone hcl, oxycodone-aspirin, augmentin [amoxicillin-pot clavulanate], codeine, etodolac, iodine, levaquin [levofloxacin in d5w], levofloxacin, metronidazole, ultram [tramadol], and sulfa antibiotics.  Review of Systems  All other systems reviewed and are negative.  Objective: BP 120/80   Ht 5\' 6"  (1.676 m)   Wt 143 lb (64.9 kg)   BMI 23.08 kg/m  Physical Exam Constitutional:      General: She is not in acute distress.    Appearance:  She is well-developed.  Genitourinary:     Bladder normal.     Right Labia: No rash or tenderness.    Left Labia: No tenderness or rash.    No vaginal erythema or bleeding.     Anterior vaginal prolapse present.    Moderate vaginal atrophy present.     Right Adnexa: not tender and no mass present.    Left Adnexa: not tender and no mass present.    No cervical motion tenderness, discharge, polyp or nabothian cyst.     Uterus is not enlarged.     No uterine mass detected.    Uterus exam comments: Gr 2 POP.     Uterus is midaxial.     Bladder exam comments: Gr 2 cystocele.     Pelvic exam was performed with patient in the lithotomy position.  HENT:     Head: Normocephalic and atraumatic.     Nose: Nose normal.  Abdominal:     General: There is no distension.     Palpations: Abdomen is soft.     Tenderness: There is no abdominal tenderness.  Musculoskeletal:        General: Normal range of motion.  Neurological:     Mental Status: She is alert and oriented to person, place, and time.     Cranial Nerves: No cranial nerve deficit.  Skin:    General: Skin is warm and dry.  Psychiatric:        Attention and Perception: Attention normal.        Mood and Affect: Mood and affect normal.        Speech: Speech normal.        Behavior: Behavior normal.        Thought Content: Thought content normal.        Judgment: Judgment normal.  Pessary Care Pessary removed and cleaned.  Vagina checked - without erosions - pessary replaced.  A/P:1. Uterovaginal prolapse, incomplete 2. Cystocele, midline: Pessary was cleaned and replaced today. Instructions given for care. Concerning symptoms to observe for are counseled to patient. Follow up scheduled for 3 months.  A total of 20 minutes were spent face-to-face with the patient as well as preparation, review, communication, and documentation during this encounter.   Barnett Applebaum, MD, [facility], [facility] 12/02/2020  10:45 AM

## 2021-01-22 ENCOUNTER — Other Ambulatory Visit: Payer: Self-pay | Admitting: Nephrology

## 2021-01-22 DIAGNOSIS — R809 Proteinuria, unspecified: Secondary | ICD-10-CM

## 2021-01-22 DIAGNOSIS — N184 Chronic kidney disease, stage 4 (severe): Secondary | ICD-10-CM

## 2021-01-22 DIAGNOSIS — E1122 Type 2 diabetes mellitus with diabetic chronic kidney disease: Secondary | ICD-10-CM

## 2021-01-22 DIAGNOSIS — R8290 Unspecified abnormal findings in urine: Secondary | ICD-10-CM

## 2021-02-07 ENCOUNTER — Ambulatory Visit
Admission: RE | Admit: 2021-02-07 | Discharge: 2021-02-07 | Disposition: A | Discharge status: Home / Self Care | Payer: Medicare HMO | Source: Ambulatory Visit | Attending: Nephrology | Admitting: Nephrology

## 2021-02-07 ENCOUNTER — Other Ambulatory Visit: Payer: Self-pay

## 2021-02-07 DIAGNOSIS — R809 Proteinuria, unspecified: Secondary | ICD-10-CM | POA: Diagnosis present

## 2021-02-07 DIAGNOSIS — E1122 Type 2 diabetes mellitus with diabetic chronic kidney disease: Secondary | ICD-10-CM | POA: Insufficient documentation

## 2021-02-07 DIAGNOSIS — N184 Chronic kidney disease, stage 4 (severe): Secondary | ICD-10-CM | POA: Diagnosis present

## 2021-02-07 DIAGNOSIS — R8290 Unspecified abnormal findings in urine: Secondary | ICD-10-CM | POA: Diagnosis not present

## 2021-03-03 ENCOUNTER — Emergency Department: Payer: Medicare HMO

## 2021-03-03 ENCOUNTER — Emergency Department
Admission: EM | Admit: 2021-03-03 | Discharge: 2021-03-03 | Disposition: A | Discharge status: Home / Self Care | Payer: Medicare HMO | Attending: Emergency Medicine | Admitting: Emergency Medicine

## 2021-03-03 ENCOUNTER — Other Ambulatory Visit: Payer: Self-pay

## 2021-03-03 DIAGNOSIS — Z85828 Personal history of other malignant neoplasm of skin: Secondary | ICD-10-CM | POA: Insufficient documentation

## 2021-03-03 DIAGNOSIS — W010XXA Fall on same level from slipping, tripping and stumbling without subsequent striking against object, initial encounter: Secondary | ICD-10-CM | POA: Diagnosis not present

## 2021-03-03 DIAGNOSIS — E1122 Type 2 diabetes mellitus with diabetic chronic kidney disease: Secondary | ICD-10-CM | POA: Diagnosis not present

## 2021-03-03 DIAGNOSIS — Z7982 Long term (current) use of aspirin: Secondary | ICD-10-CM | POA: Insufficient documentation

## 2021-03-03 DIAGNOSIS — I129 Hypertensive chronic kidney disease with stage 1 through stage 4 chronic kidney disease, or unspecified chronic kidney disease: Secondary | ICD-10-CM | POA: Diagnosis not present

## 2021-03-03 DIAGNOSIS — S6992XA Unspecified injury of left wrist, hand and finger(s), initial encounter: Secondary | ICD-10-CM | POA: Diagnosis present

## 2021-03-03 DIAGNOSIS — Z7984 Long term (current) use of oral hypoglycemic drugs: Secondary | ICD-10-CM | POA: Diagnosis not present

## 2021-03-03 DIAGNOSIS — I2510 Atherosclerotic heart disease of native coronary artery without angina pectoris: Secondary | ICD-10-CM | POA: Diagnosis not present

## 2021-03-03 DIAGNOSIS — W19XXXA Unspecified fall, initial encounter: Secondary | ICD-10-CM

## 2021-03-03 DIAGNOSIS — E039 Hypothyroidism, unspecified: Secondary | ICD-10-CM | POA: Insufficient documentation

## 2021-03-03 DIAGNOSIS — Z7902 Long term (current) use of antithrombotics/antiplatelets: Secondary | ICD-10-CM | POA: Diagnosis not present

## 2021-03-03 DIAGNOSIS — Z79899 Other long term (current) drug therapy: Secondary | ICD-10-CM | POA: Diagnosis not present

## 2021-03-03 DIAGNOSIS — S0003XA Contusion of scalp, initial encounter: Secondary | ICD-10-CM | POA: Diagnosis not present

## 2021-03-03 DIAGNOSIS — S0990XA Unspecified injury of head, initial encounter: Secondary | ICD-10-CM

## 2021-03-03 DIAGNOSIS — N184 Chronic kidney disease, stage 4 (severe): Secondary | ICD-10-CM | POA: Insufficient documentation

## 2021-03-03 DIAGNOSIS — S52572A Other intraarticular fracture of lower end of left radius, initial encounter for closed fracture: Secondary | ICD-10-CM | POA: Insufficient documentation

## 2021-03-03 MED ORDER — PROPOFOL 10 MG/ML IV BOLUS: 0.5000 mg/kg | Freq: Once | INTRAVENOUS | Status: DC

## 2021-03-03 MED ORDER — HYDROCODONE-ACETAMINOPHEN 5-325 MG PO TABS: 1.0000 | ORAL_TABLET | ORAL | 0 refills | Status: DC | PRN

## 2021-03-03 MED ORDER — HYDROCODONE-ACETAMINOPHEN 5-325 MG PO TABS
1.0000 | ORAL_TABLET | Freq: Once | ORAL | Status: AC
  Administered 2021-03-03: 1 via ORAL
  Filled 2021-03-03: qty 1

## 2021-03-03 NOTE — ED Triage Notes (Signed)
Pt BIBA from home for a fall. Pt lost balance and tripped landing on left arm. Possible LOC. Pt c/o left wrist pain. EMS gave 50 mcg fentanyl.

## 2021-03-03 NOTE — ED Provider Notes (Signed)
[facility] Emergency Department Provider Note   ____________________________________________   Event Date/Time   First MD Initiated Contact with Patient 03/03/21 1843     (approximate)  I have reviewed the triage vital signs and the nursing notes.   HISTORY  Chief Complaint Fall    HPI Erika Ochoa is a 87 y.o. female Who presents via EMS after a mechanical fall from standing and complaining of left wrist pain  LOCATION: Left wrist DURATION: Just prior to arrival TIMING: Slightly improved since onset SEVERITY: 9/10 QUALITY: Aching CONTEXT: Patient states that she lost her balance and fell backwards try to catch her self on the left hand when she felt a pop and severe pain MODIFYING FACTORS: Slightly improved with fentanyl given by EMS in route and worsened with any movement at the left wrist ASSOCIATED SYMPTOMS: Occipital scalp contusion   Per medical record review, past medical history noncontributory and does not use blood thinners          Past Medical History:  Diagnosis Date   Abnormal mammogram, unspecified    Actinic keratosis    Breast screening, unspecified    Diabetes ([place])    Hypertension    Mammographic microcalcification    Squamous cell carcinoma of skin 06/14/2019   Left superior forehead. Well differentiated. Tx: EDC   Squamous cell carcinoma of skin 09/13/2019   L of midline med forehead at hairline SCCIS    Vaginal pessary present     Patient Active Problem List   Diagnosis Date Noted   Hypomagnesemia    NSTEMI (non-ST elevated myocardial infarction) ([place]) 09/08/2020   CKD stage 4 due to type 2 diabetes mellitus ([place]) 09/08/2020   Hyponatremia 09/08/2020   Symptomatic anemia 08/30/2019   Gout 08/30/2019   Elevated troponin 08/30/2019   GIB (gastrointestinal bleeding) 06/10/2018   Cellulitis of leg, left 03/07/2018   Bilateral edema of lower extremity 03/07/2018   Bilateral lower extremity pain 03/07/2018    Cystocele, midline 08/12/2016   Uterovaginal prolapse, incomplete 08/12/2016   History of transient ischemic attack (TIA) 02/05/2016   TIA (transient ischemic attack) 12/19/2015   Acute kidney injury superimposed on CKD ([place]) 12/19/2015   Primary osteoarthritis of both first carpometacarpal joints 07/24/2015   B12 deficiency 02/25/2015   SVT (supraventricular tachycardia) ([place]) 07/11/2014   Cervical pain 07/11/2014   CAD (coronary artery disease) 07/11/2014   Benign essential HTN 07/11/2014   HLD (hyperlipidemia) 07/11/2014   Type 2 diabetes mellitus with hyperlipidemia ([place]) 07/11/2014   Essential hypertension 07/11/2014   Hypothyroidism 07/11/2014    Past Surgical History:  Procedure Laterality Date   BREAST BIOPSY Left 2013   stereo calcs   BREAST EXCISIONAL BIOPSY Left    "years ago"-benign   ESOPHAGOGASTRODUODENOSCOPY Left 06/12/2018   Procedure: ESOPHAGOGASTRODUODENOSCOPY (EGD);  Surgeon: Virgel Manifold, MD;  Location: [facility] ENDOSCOPY;  Service: Endoscopy;  Laterality: Left;   ESOPHAGOGASTRODUODENOSCOPY N/A 08/31/2019   Procedure: ESOPHAGOGASTRODUODENOSCOPY (EGD);  Surgeon: Toledo, Benay Pike, MD;  Location: [facility] ENDOSCOPY;  Service: Gastroenterology;  Laterality: N/A;   SKIN LESION EXCISION      Prior to Admission medications   Medication Sig Start Date End Date Taking? Authorizing Provider  HYDROcodone-acetaminophen (NORCO) 5-325 MG tablet Take 1 tablet by mouth every 4 (four) hours as needed for moderate pain. 03/03/21  Yes Naaman Plummer, MD  allopurinol (ZYLOPRIM) 100 MG tablet Take 100 mg by mouth daily. 08/23/19   [provider]  aspirin EC 81 MG EC tablet Take  1 tablet (81 mg total) by mouth daily. Swallow whole. 09/11/20   Loletha Grayer, MD  atorvastatin (LIPITOR) 40 MG tablet Take 1 tablet (40 mg total) by mouth daily. 09/10/20 09/10/21  Loletha Grayer, MD  clopidogrel (PLAVIX) 75 MG tablet Take 1 tablet (75 mg total) by mouth daily. 09/11/20   Loletha Grayer, MD  cyanocobalamin (,VITAMIN B-12,) 1000 MCG/ML injection Inject 1,000 mcg into the muscle every 30 (thirty) days.  10/25/15   [provider]  diltiazem (CARDIZEM CD) 120 MG 24 hr capsule Take 1 capsule (120 mg total) by mouth daily. 09/11/20   Loletha Grayer, MD  donepezil (ARICEPT) 5 MG tablet Take 1 tablet by mouth every evening. 07/20/20   [provider]  ferrous sulfate 325 (65 FE) MG EC tablet Take 1 tablet (325 mg total) by mouth 2 (two) times daily. 08/31/19 08/30/20  Lorella Nimrod, MD  glipiZIDE (GLUCOTROL XL) 5 MG 24 hr tablet Take 5 mg by mouth 2 (two) times daily.  07/01/18   [provider]  isosorbide mononitrate (IMDUR) 30 MG 24 hr tablet Take 1 tablet (30 mg total) by mouth daily. 09/11/20   Loletha Grayer, MD  levothyroxine (SYNTHROID, LEVOTHROID) 50 MCG tablet Take 50 mcg by mouth daily.     [provider]  Magnesium Oxide (MAG-OXIDE) 200 MG TABS Take 2 tablets (400 mg total) by mouth 2 (two) times daily. 09/18/20   Cuthriell, Charline Bills, PA-C  metoprolol tartrate (LOPRESSOR) 50 MG tablet Take 1 tablet (50 mg total) by mouth 2 (two) times daily. 09/10/20   Loletha Grayer, MD  nitroGLYCERIN (NITROSTAT) 0.4 MG SL tablet Place 1 tablet (0.4 mg total) under the tongue every 5 (five) minutes x 3 doses as needed for chest pain (max 3). 09/10/20   Loletha Grayer, MD  pantoprazole (PROTONIX) 40 MG tablet Take 1 tablet (40 mg total) by mouth daily. 08/31/19 08/30/20  Lorella Nimrod, MD  pioglitazone (ACTOS) 30 MG tablet Take 30 mg by mouth daily. 07/12/19   [provider]  TRADJENTA 5 MG TABS tablet Take 5 mg by mouth daily.  07/01/18   [provider]  Vitamin D, Ergocalciferol, (DRISDOL) 1.25 MG (50000 UT) CAPS capsule Take 50,000 Units by mouth every Saturday.  08/04/18   [provider]    Allergies Oxycodone hcl, Oxycodone-aspirin, Augmentin [amoxicillin-pot clavulanate], Codeine, Etodolac, Iodine, Levaquin [levofloxacin  in d5w], Levofloxacin, Metronidazole, Ultram [tramadol], and Sulfa antibiotics  Family History  Problem Relation Age of Onset   Cancer Mother    Breast cancer Neg Hx     Social History Social History   Tobacco Use   Smoking status: Never   Smokeless tobacco: Never  Vaping Use   Vaping Use: Never used  Substance Use Topics   Alcohol use: No    Alcohol/week: 0.0 standard drinks   Drug use: No    Review of Systems Constitutional: No fever/chills Eyes: No visual changes. ENT: No sore throat. Cardiovascular: Denies chest pain. Respiratory: Denies shortness of breath. Gastrointestinal: No abdominal pain.  No nausea, no vomiting.  No diarrhea. Genitourinary: Negative for dysuria. Musculoskeletal: Positive for acute left wrist pain Skin: Negative for rash. Neurological: Negative for headaches, weakness/numbness/paresthesias in any extremity Psychiatric: Negative for suicidal ideation/homicidal ideation   ____________________________________________   PHYSICAL EXAM:  VITAL SIGNS: ED Triage Vitals  Enc Vitals Group     BP 03/03/21 1850 (!) 189/68     Pulse Rate 03/03/21 1850 (!) 56     Resp 03/03/21 1850 15  Temp 03/03/21 1847 98.3 F (36.8 C)     Temp Source 03/03/21 1847 Oral     SpO2 03/03/21 1850 95 %     Weight 03/03/21 1843 150 lb 9.2 oz (68.3 kg)     Height 03/03/21 1843 5\' 6"  (1.676 m)     Head Circumference --      Peak Flow --      Pain Score --      Pain Loc --      Pain Edu? --      Excl. in [place]? --    Constitutional: Alert and oriented. Well appearing and in no acute distress. Eyes: Conjunctivae are normal. PERRL. Head: Small 3 cm diameter contusion to the left occipital scalp Nose: No congestion/rhinnorhea. Mouth/Throat: Mucous membranes are moist. Neck: No stridor Cardiovascular: Grossly normal heart sounds.  Good peripheral circulation. Respiratory: Normal respiratory effort.  No retractions. Gastrointestinal: Soft and nontender. No  distention. Musculoskeletal: Ecchymosis and deformity over the dorsal aspect of the left wrist Neurologic:  Normal speech and language. No gross focal neurologic deficits are appreciated. Skin:  Skin is warm and dry. No rash noted. Psychiatric: Mood and affect are normal. Speech and behavior are normal.  ____________________________________________   LABS (all labs ordered are listed, but only abnormal results are displayed)  Labs Reviewed - No data to display ____________________________________________  RADIOLOGY  ED MD interpretation: X-ray of the left forearm shows a comminuted intra-articular distal left radius fracture with dorsal impaction as well as a minimally displaced ulnar styloid fracture  CT of the head without contrast shows no evidence of acute abnormalities including no intracerebral hemorrhage, obvious masses, or significant edema  CT of the cervical spine without contrast shows no evidence of acute fractures or dislocations  Official radiology report(s): DG Forearm Left  Result Date: 03/03/2021 CLINICAL DATA:  Fell, deformity, bruising EXAM: LEFT FOREARM - 2 VIEW COMPARISON:  None. FINDINGS: Frontal and lateral views of the left forearm are obtained. There is a comminuted intra-articular fracture involving the distal left radius, with dorsal impaction at the fracture site. Small displaced ulnar styloid fracture is also noted. No other acute bony abnormalities. Alignment at the left elbow is anatomic. Prominent osteoarthritis is incidentally noted within the medial aspect of the carpus and at the first carpometacarpal joint. IMPRESSION: 1. Comminuted intra-articular distal left radial fracture with dorsal impaction. 2. Minimally displaced ulnar styloid fracture. 3. Left wrist osteoarthritis. Electronically Signed   By: Randa Ngo M.D.   On: 03/03/2021 19:39   CT Head Wo Contrast  Result Date: 03/03/2021 CLINICAL DATA:  Loss of balance, fell EXAM: CT HEAD WITHOUT  CONTRAST TECHNIQUE: Contiguous axial images were obtained from the base of the skull through the vertex without intravenous contrast. COMPARISON:  10/17/2020 FINDINGS: Brain: No acute infarct or hemorrhage. Lateral ventricles and midline structures are unremarkable. No acute extra-axial fluid collections. No mass effect. Stable atrophy. Vascular: No hyperdense vessel or unexpected calcification. Stable atherosclerosis. Skull: Normal. Negative for fracture or focal lesion. Sinuses/Orbits: Stable opacification right sphenoid sinus. Remaining paranasal sinuses are clear. Stable small effusions of the bilateral mastoid air cells. Other: None. IMPRESSION: 1. No acute intracranial process. 2. Stable right sphenoid sinus disease. Electronically Signed   By: Randa Ngo M.D.   On: 03/03/2021 19:43   CT Cervical Spine Wo Contrast  Result Date: 03/03/2021 CLINICAL DATA:  Loss of balance, fell EXAM: CT CERVICAL SPINE WITHOUT CONTRAST TECHNIQUE: Multidetector CT imaging of the cervical spine was performed without intravenous contrast. Multiplanar  CT image reconstructions were also generated. COMPARISON:  10/17/2020 FINDINGS: Alignment: Minimal anterolisthesis of C4 on C5 unchanged. Otherwise alignment is anatomic. Skull base and vertebrae: No acute fracture. No primary bone lesion or focal pathologic process. Soft tissues and spinal canal: No prevertebral fluid or swelling. No visible canal hematoma. Disc levels: Stable multilevel spondylosis and facet hypertrophy, with disc space narrowing greatest at C5-6 and C6-7. Bony fusion across the right C4-5 facet. Upper chest: Airway is patent.  Chronic scarring at the lung apices. Other: Reconstructed images demonstrate no additional findings. IMPRESSION: 1. No acute cervical spine fracture. 2. Stable multilevel spondylosis and facet hypertrophy. Electronically Signed   By: Randa Ngo M.D.   On: 03/03/2021 19:45     ____________________________________________   PROCEDURES  Procedure(s) performed (including Critical Care):  .Ortho Injury Treatment  Date/Time: 03/03/2021 9:06 PM Performed by: Naaman Plummer, MD Authorized by: Naaman Plummer, MD   Consent:    Consent obtained:  Verbal   Consent given by:  Patient   Risks discussed:  Fracture   Alternatives discussed:  No treatment and alternative treatmentInjury location: forearm Location details: left forearm Injury type: fracture Fracture type: distal radius and ulnar styloid Pre-procedure distal perfusion: normal Pre-procedure neurological function: normal Pre-procedure range of motion: normal  Anesthesia: Local anesthesia used: no  Patient sedated: NoManipulation performed: no Immobilization: splint Splint type: sugar tong Splint Applied by: ED Provider Supplies used: cotton padding and Ortho-Glass Post-procedure neurovascular assessment: post-procedure neurovascularly intact Post-procedure distal perfusion: normal Post-procedure neurological function: normal Post-procedure range of motion: normal     ____________________________________________   INITIAL IMPRESSION / ASSESSMENT AND PLAN / ED COURSE  As part of my medical decision making, I reviewed the following data within the electronic medical record, if available:  Nursing notes reviewed and incorporated, Labs reviewed, EKG interpreted, Old chart reviewed, Radiograph reviewed and Notes from prior ED visits reviewed and incorporated      Presenting after a fall that occurred just prior to arrival, resulting in injury to the left wrist. The mechanism of injury was a mechanical ground level fall without syncope or near-syncope. The current level of pain is moderate. There was no loss of consciousness, confusion, seizure, or memory impairment. There is not a laceration associated with the injury. Denies neck pain. The patient does not take blood thinner  medications. Denies vomiting, numbness/weakness, fever CT head and neck negative for any acute abnormalities Patient placed in splint with instructions to follow-up with orthopedics Dispo: Discharge with PCP follow-up          ____________________________________________   FINAL CLINICAL IMPRESSION(S) / ED DIAGNOSES  Final diagnoses:  Fall, initial encounter  Injury of head, initial encounter  Other closed intra-articular fracture of distal end of left radius, initial encounter     ED Discharge Orders          Ordered    HYDROcodone-acetaminophen (NORCO) 5-325 MG tablet  Every 4 hours PRN        03/03/21 2025             Note:  This document was prepared using Dragon voice recognition software and may include unintentional dictation errors.    Naaman Plummer, MD 03/03/21 2107

## 2021-03-12 ENCOUNTER — Ambulatory Visit: Payer: Medicare HMO | Admitting: Obstetrics & Gynecology

## 2021-04-08 ENCOUNTER — Ambulatory Visit: Payer: Medicare HMO | Admitting: Obstetrics & Gynecology

## 2021-04-08 ENCOUNTER — Other Ambulatory Visit: Payer: Self-pay

## 2021-04-08 ENCOUNTER — Encounter: Payer: Self-pay | Admitting: Obstetrics & Gynecology

## 2021-04-08 VITALS — BP 120/70

## 2021-04-08 DIAGNOSIS — N8111 Cystocele, midline: Secondary | ICD-10-CM | POA: Diagnosis not present

## 2021-04-08 DIAGNOSIS — N812 Incomplete uterovaginal prolapse: Secondary | ICD-10-CM

## 2021-04-08 NOTE — Progress Notes (Signed)
HPI:      [name] is a 87 y.o. G2P0011 who presents today for her pessary follow up and examination related to her pelvic floor weakening.  Pt reports tolerating the pessary well with no vaginal bleeding and no vaginal discharge.  Symptoms of pelvic floor weakening have greatly improved. She is voiding and defecating without difficulty. She currently has a Ring #4 pessary.  PMHx: She  has a past medical history of Abnormal mammogram, unspecified, Actinic keratosis, Breast screening, unspecified, Diabetes ([place]), Hypertension, Mammographic microcalcification, Squamous cell carcinoma of skin (06/14/2019), Squamous cell carcinoma of skin (09/13/2019), and Vaginal pessary present. Also,  has a past surgical history that includes Skin lesion excision; Breast biopsy (Left, 2013); Breast excisional biopsy (Left); Esophagogastroduodenoscopy (Left, 06/12/2018); and Esophagogastroduodenoscopy (N/A, 08/31/2019)., family history includes Cancer in her mother.,  reports that she has never smoked. She has never used smokeless tobacco. She reports that she does not drink alcohol and does not use drugs.  She has a current medication list which includes the following prescription(s): allopurinol, aspirin, atorvastatin, clopidogrel, cyanocobalamin, diltiazem, donepezil, ferrous sulfate, glipizide, hydrocodone-acetaminophen, isosorbide mononitrate, levothyroxine, mag-oxide, metoprolol tartrate, nitroglycerin, pantoprazole, pioglitazone, tradjenta, and vitamin d (ergocalciferol). Also, is allergic to oxycodone hcl, oxycodone-aspirin, augmentin [amoxicillin-pot clavulanate], codeine, etodolac, iodine, levaquin [levofloxacin in d5w], levofloxacin, metronidazole, ultram [tramadol], and sulfa antibiotics.  Review of Systems  All other systems reviewed and are negative.  Objective: BP 120/70  Physical Exam Constitutional:      General: She is not in acute distress.    Appearance: She is well-developed.   Genitourinary:     Right Labia: No rash or tenderness.    Left Labia: No tenderness or rash.    No vaginal erythema or bleeding.      Right Adnexa: not tender and no mass present.    Left Adnexa: not tender and no mass present.    No cervical motion tenderness, discharge, polyp or nabothian cyst.     Uterus is prolapsed.     Uterus is not enlarged.     No uterine mass detected.    Uterus is midaxial.     Bladder exam comments: Cystocele Gr2.     Pelvic exam was performed with patient in the lithotomy position.  HENT:     Head: Normocephalic and atraumatic.     Nose: Nose normal.  Abdominal:     General: There is no distension.     Palpations: Abdomen is soft.     Tenderness: There is no abdominal tenderness.  Musculoskeletal:        General: Normal range of motion.  Neurological:     Mental Status: She is alert and oriented to person, place, and time.     Cranial Nerves: No cranial nerve deficit.  Skin:    General: Skin is warm and dry.  Psychiatric:        Attention and Perception: Attention normal.        Mood and Affect: Mood and affect normal.        Speech: Speech normal.        Behavior: Behavior normal.        Thought Content: Thought content normal.        Judgment: Judgment normal.    Pessary Care Pessary removed and cleaned.  Vagina checked - without erosions - pessary replaced.  A/P:   ICD-10-CM   1. Uterovaginal prolapse, incomplete  N81.2     2. Cystocele, midline  N81.11  Pessary was cleaned and replaced today. Instructions given for care. Concerning symptoms to observe for are counseled to patient. Follow up scheduled for 3-4 months.  A total of 20 minutes were spent face-to-face with the patient as well as preparation, review, communication, and documentation during this encounter.   Barnett Applebaum, MD, [facility], [facility] 04/08/2021  11:13 AM

## 2021-04-23 ENCOUNTER — Other Ambulatory Visit: Payer: Self-pay

## 2021-04-23 ENCOUNTER — Ambulatory Visit: Payer: Medicare HMO | Admitting: Dermatology

## 2021-04-23 DIAGNOSIS — L821 Other seborrheic keratosis: Secondary | ICD-10-CM | POA: Diagnosis not present

## 2021-04-23 DIAGNOSIS — L814 Other melanin hyperpigmentation: Secondary | ICD-10-CM | POA: Diagnosis not present

## 2021-04-23 DIAGNOSIS — C44329 Squamous cell carcinoma of skin of other parts of face: Secondary | ICD-10-CM | POA: Diagnosis not present

## 2021-04-23 DIAGNOSIS — D492 Neoplasm of unspecified behavior of bone, soft tissue, and skin: Secondary | ICD-10-CM

## 2021-04-23 DIAGNOSIS — L578 Other skin changes due to chronic exposure to nonionizing radiation: Secondary | ICD-10-CM

## 2021-04-23 DIAGNOSIS — C44320 Squamous cell carcinoma of skin of unspecified parts of face: Secondary | ICD-10-CM

## 2021-04-23 NOTE — Progress Notes (Signed)
Follow-Up Visit   Subjective  Erika Ochoa is a 87 y.o. female who presents for the following: check spot (L cheek, 10m, pt picks at). She has history of significant sun damage is concerned about skin cancer. She is also concerned about other spots she would like evaluated on her skin. Patient accompanied by daughter in law who contributes to history.  The following portions of the chart were reviewed this encounter and updated as appropriate:   Tobacco  Allergies  Meds  Problems  Med Hx  Surg Hx  Fam Hx     Review of Systems:  No other skin or systemic complaints except as noted in HPI or Assessment and Plan.  Objective  Well appearing patient in no apparent distress; mood and affect are within normal limits.  A focused examination was performed including face. Relevant physical exam findings are noted in the Assessment and Plan.  L cheek Crusted pap 1.5 x 1.0cm      Assessment & Plan  Neoplasm of skin L cheek  Epidermal / dermal shaving  Lesion diameter (cm):  1.5 Informed consent: discussed and consent obtained   Timeout: patient name, date of birth, surgical site, and procedure verified   Procedure prep:  Patient was prepped and draped in usual sterile fashion Prep type:  Isopropyl alcohol Anesthesia: the lesion was anesthetized in a standard fashion   Anesthetic:  1% lidocaine w/ epinephrine 1-100,000 buffered w/ 8.4% NaHCO3 Instrument used: flexible razor blade   Hemostasis achieved with: pressure, aluminum chloride and electrodesiccation   Outcome: patient tolerated procedure well   Post-procedure details: sterile dressing applied and wound care instructions given   Dressing type: bacitracin and pressure dressing    Destruction of lesion Complexity: extensive   Destruction method: electrodesiccation and curettage   Informed consent: discussed and consent obtained   Timeout:  patient name, date of birth, surgical site, and procedure verified Procedure  prep:  Patient was prepped and draped in usual sterile fashion Prep type:  Isopropyl alcohol Anesthesia: the lesion was anesthetized in a standard fashion   Anesthetic:  1% lidocaine w/ epinephrine 1-100,000 buffered w/ 8.4% NaHCO3 Curettage performed in three different directions: Yes   Electrodesiccation performed over the curetted area: Yes   Lesion length (cm):  1.5 Lesion width (cm):  1 Margin per side (cm):  0.2 Final wound size (cm):  1.9 Hemostasis achieved with:  pressure, aluminum chloride and electrodesiccation Outcome: patient tolerated procedure well with no complications   Post-procedure details: sterile dressing applied and wound care instructions given   Dressing type: bacitracin and pressure dressing    Specimen 1 - Surgical pathology Differential Diagnosis: D48.5 SCC r/o Priguo Nodule  Check Margins: No Crusted pap 1.5 x 1.0cm EDC today  Lentigines - Scattered tan macules - Due to sun exposure - Benign-appering, observe - Recommend daily broad spectrum sunscreen SPF 30+ to sun-exposed areas, reapply every 2 hours as needed. - Call for any changes  Actinic Damage - chronic, secondary to cumulative UV radiation exposure/sun exposure over time - diffuse scaly erythematous macules with underlying dyspigmentation - Recommend daily broad spectrum sunscreen SPF 30+ to sun-exposed areas, reapply every 2 hours as needed.  - Recommend staying in the shade or wearing long sleeves, sun glasses (UVA+UVB protection) and wide brim hats (4-inch brim around the entire circumference of the hat). - Call for new or changing lesions.  Seborrheic Keratoses - Stuck-on, waxy, tan-brown papules and/or plaques  - Benign-appearing - Discussed benign etiology and prognosis. -  Observe - Call for any changes  Return for as scheduled for [facility].  I, Erika Ochoa, RMA, am acting as scribe for Sarina Ser, MD . Documentation: I have reviewed the above documentation for accuracy and  completeness, and I agree with the above.  Sarina Ser, MD

## 2021-04-23 NOTE — Patient Instructions (Addendum)

## 2021-04-24 ENCOUNTER — Telehealth: Payer: Self-pay

## 2021-04-24 NOTE — Telephone Encounter (Signed)
Advised patient of results/hd  

## 2021-04-24 NOTE — Telephone Encounter (Signed)
-----   Message from Ralene Bathe, MD sent at 04/24/2021  5:39 PM EST ----- Diagnosis Skin , left cheek WELL DIFFERENTIATED SQUAMOUS CELL CARCINOMA, ACANTHOLYTIC (ADENOID) VARIANT, ULCERATED  Cancer - SCC Already treated Recheck next visit

## 2021-05-06 ENCOUNTER — Encounter: Payer: Self-pay | Admitting: Dermatology

## 2021-06-23 ENCOUNTER — Ambulatory Visit: Payer: Medicare HMO | Admitting: Dermatology

## 2021-06-23 ENCOUNTER — Other Ambulatory Visit: Payer: Self-pay

## 2021-06-23 ENCOUNTER — Encounter: Payer: Self-pay | Admitting: Dermatology

## 2021-06-23 DIAGNOSIS — Z85828 Personal history of other malignant neoplasm of skin: Secondary | ICD-10-CM

## 2021-06-23 DIAGNOSIS — C44320 Squamous cell carcinoma of skin of unspecified parts of face: Secondary | ICD-10-CM

## 2021-06-23 DIAGNOSIS — D489 Neoplasm of uncertain behavior, unspecified: Secondary | ICD-10-CM

## 2021-06-23 DIAGNOSIS — L570 Actinic keratosis: Secondary | ICD-10-CM

## 2021-06-23 DIAGNOSIS — L820 Inflamed seborrheic keratosis: Secondary | ICD-10-CM

## 2021-06-23 DIAGNOSIS — C44329 Squamous cell carcinoma of skin of other parts of face: Secondary | ICD-10-CM

## 2021-06-23 DIAGNOSIS — L578 Other skin changes due to chronic exposure to nonionizing radiation: Secondary | ICD-10-CM

## 2021-06-23 MED ORDER — MUPIROCIN 2 % EX OINT: 1.0000 "application " | TOPICAL_OINTMENT | Freq: Every day | CUTANEOUS | 0 refills | Status: DC

## 2021-06-23 NOTE — Patient Instructions (Addendum)
Electrodesiccation and Curettage (Scrape and Burn) Wound Care Instructions  Leave the original bandage on for 24 hours if possible.  If the bandage becomes soaked or soiled before that time, it is OK to remove it and examine the wound.  A small amount of post-operative bleeding is normal.  If excessive bleeding occurs, remove the bandage, place gauze over the site and apply continuous pressure (no peeking) over the area for 30 minutes. If this does not work, please call our clinic as soon as possible or page your doctor if it is after hours.   Once a day, cleanse the wound with soap and water. It is fine to shower. If a thick crust develops you may use a Q-tip dipped into dilute hydrogen peroxide (mix 1:1 with water) to dissolve it.  Hydrogen peroxide can slow the healing process, so use it only as needed.    After washing, apply petroleum jelly (Vaseline) or an antibiotic ointment if your doctor prescribed one for you, followed by a bandage.    For best healing, the wound should be covered with a layer of ointment at all times. If you are not able to keep the area covered with a bandage to hold the ointment in place, this may mean re-applying the ointment several times a day.  Continue this wound care until the wound has healed and is no longer open. It may take several weeks for the wound to heal and close.  Itching and mild discomfort is normal during the healing process.  If you have any discomfort, you can take Tylenol (acetaminophen) or ibuprofen as directed on the bottle. (Please do not take these if you have an allergy to them or cannot take them for another reason).  Some redness, tenderness and white or yellow material in the wound is normal healing.  If the area becomes very sore and red, or develops a thick yellow-green material (pus), it may be infected; please notify us.    Wound healing continues for up to one year following surgery. It is not unusual to experience pain in the scar  from time to time during the interval.  If the pain becomes severe or the scar thickens, you should notify the office.    A slight amount of redness in a scar is expected for the first six months.  After six months, the redness will fade and the scar will soften and fade.  The color difference becomes less noticeable with time.  If there are any problems, return for a post-op surgery check at your earliest convenience.  To improve the appearance of the scar, you can use silicone scar gel, cream, or sheets (such as Mederma or Serica) every night for up to one year. These are available over the counter (without a prescription).  Please call our office at [phone] for any questions or concerns.  Biopsy Wound Care Instructions  Leave the original bandage on for 24 hours if possible.  If the bandage becomes soaked or soiled before that time, it is OK to remove it and examine the wound.  A small amount of post-operative bleeding is normal.  If excessive bleeding occurs, remove the bandage, place gauze over the site and apply continuous pressure (no peeking) over the area for 30 minutes. If this does not work, please call our clinic as soon as possible or page your doctor if it is after hours.   Once a day, cleanse the wound with soap and water. It is fine to shower. If  a thick crust develops you may use a Q-tip dipped into dilute hydrogen peroxide (mix 1:1 with water) to dissolve it.  Hydrogen peroxide can slow the healing process, so use it only as needed.    After washing, apply petroleum jelly (Vaseline) or an antibiotic ointment if your doctor prescribed one for you, followed by a bandage.    For best healing, the wound should be covered with a layer of ointment at all times. If you are not able to keep the area covered with a bandage to hold the ointment in place, this may mean re-applying the ointment several times a day.  Continue this wound care until the wound has healed and is no longer  open.   Itching and mild discomfort is normal during the healing process. However, if you develop pain or severe itching, please call our office.   If you have any discomfort, you can take Tylenol (acetaminophen) or ibuprofen as directed on the bottle. (Please do not take these if you have an allergy to them or cannot take them for another reason).  Some redness, tenderness and white or yellow material in the wound is normal healing.  If the area becomes very sore and red, or develops a thick yellow-green material (pus), it may be infected; please notify us.    If you have stitches, return to clinic as directed to have the stitches removed. You will continue wound care for 2-3 days after the stitches are removed.   Wound healing continues for up to one year following surgery. It is not unusual to experience pain in the scar from time to time during the interval.  If the pain becomes severe or the scar thickens, you should notify the office.    A slight amount of redness in a scar is expected for the first six months.  After six months, the redness will fade and the scar will soften and fade.  The color difference becomes less noticeable with time.  If there are any problems, return for a post-op surgery check at your earliest convenience.  To improve the appearance of the scar, you can use silicone scar gel, cream, or sheets (such as Mederma or Serica) every night for up to one year. These are available over the counter (without a prescription).  Please call our office at [phone] for any questions or concerns.   Actinic keratoses are precancerous spots that appear secondary to cumulative UV radiation exposure/sun exposure over time. They are chronic with expected duration over 1 year. A portion of actinic keratoses will progress to squamous cell carcinoma of the skin. It is not possible to reliably predict which spots will progress to skin cancer and so treatment is recommended to prevent  development of skin cancer.  Recommend daily broad spectrum sunscreen SPF 30+ to sun-exposed areas, reapply every 2 hours as needed.  Recommend staying in the shade or wearing long sleeves, sun glasses (UVA+UVB protection) and wide brim hats (4-inch brim around the entire circumference of the hat). Call for new or changing lesions.   Cryotherapy Aftercare  Wash gently with soap and water everyday.   Apply Vaseline and Band-Aid daily until healed.     If You Need Anything After Your Visit  If you have any questions or concerns for your doctor, please call our main line at [phone] and press option 4 to reach your doctor's medical assistant. If no one answers, please leave a voicemail as directed and we will return your call as soon  as possible. Messages left after 4 pm will be answered the following business day.   You may also send [place] a message via [place]. We typically respond to MyChart messages within 1-2 business days.  For prescription refills, please ask your pharmacy to contact our office. Our fax number is [phone].  If you have an urgent issue when the clinic is closed that cannot wait until the next business day, you can page your doctor at the number below.    Please note that while we do our best to be available for urgent issues outside of office hours, we are not available 24/7.   If you have an urgent issue and are unable to reach [place], you may choose to seek medical care at your doctor's office, retail clinic, urgent care center, or emergency room.  If you have a medical emergency, please immediately call 911 or go to the emergency department.  Pager Numbers  - Dr. Nehemiah Massed: [phone]  - Dr. Laurence Ferrari: [phone]  - Dr. Nicole Kindred: [phone]  In the event of inclement weather, please call our main line at [phone] for an update on the status of any delays or closures.  Dermatology Medication Tips: Please keep the boxes that topical medications come in  in order to help keep track of the instructions about where and how to use these. Pharmacies typically print the medication instructions only on the boxes and not directly on the medication tubes.   If your medication is too expensive, please contact our office at [phone] option 4 or send [place] a message through [place].   We are unable to tell what your co-pay for medications will be in advance as this is different depending on your insurance coverage. However, we may be able to find a substitute medication at lower cost or fill out paperwork to get insurance to cover a needed medication.   If a prior authorization is required to get your medication covered by your insurance company, please allow [place] 1-2 business days to complete this process.  Drug prices often vary depending on where the prescription is filled and some pharmacies may offer cheaper prices.  The website www.goodrx.com contains coupons for medications through different pharmacies. The prices here do not account for what the cost may be with help from insurance (it may be cheaper with your insurance), but the website can give you the price if you did not use any insurance.  - You can print the associated coupon and take it with your prescription to the pharmacy.  - You may also stop by our office during regular business hours and pick up a GoodRx coupon card.  - If you need your prescription sent electronically to a different pharmacy, notify our office through [facility] or by phone at [phone] option 4.     Si Usted Necesita Algo Despus de Su Visita  Tambin puede enviarnos un mensaje a travs de Pharmacist, community. Por lo general respondemos a los mensajes de MyChart en el transcurso de 1 a 2 das hbiles.  Para renovar recetas, por favor pida a su farmacia que se ponga en contacto con nuestra oficina. Harland Dingwall de fax es [place] [phone].  Si tiene un asunto urgente cuando la clnica est cerrada y que no puede  esperar hasta el siguiente da hbil, puede llamar/localizar a su doctor(a) al nmero que aparece a continuacin.   Por favor, tenga en cuenta que aunque hacemos todo lo posible para estar disponibles para asuntos urgentes fuera del horario de  oficina, no estamos disponibles las 24 horas del da, los 7 das de la [place].   Si tiene un problema urgente y no puede comunicarse con nosotros, puede optar por buscar atencin mdica  en el consultorio de su doctor(a), en una clnica privada, en un centro de atencin urgente o en una sala de emergencias.  Si tiene Engineering geologist, por favor llame inmediatamente al 911 o vaya a la sala de emergencias.  Nmeros de bper  - Dr. Nehemiah Massed: [phone]  - Dra. Moye: [phone]  - Dra. Nicole Kindred: [phone]  En caso de inclemencias del [place], por favor llame a Johnsie Kindred principal al [phone] para una actualizacin sobre el [place] de cualquier retraso o cierre.  Consejos para la medicacin en dermatologa: Por favor, guarde las cajas en las que vienen los medicamentos de uso tpico para ayudarle a seguir las instrucciones sobre dnde y cmo usarlos. Las farmacias generalmente imprimen las instrucciones del medicamento slo en las cajas y no directamente en los tubos del [place].   Si su medicamento es muy caro, por favor, pngase en contacto con Zigmund Daniel llamando al [phone] y presione la opcin 4 o envenos un mensaje a travs de Pharmacist, community.   No podemos decirle cul ser su copago por los medicamentos por adelantado ya que esto es diferente dependiendo de la cobertura de su seguro. Sin embargo, es posible que podamos encontrar un medicamento sustituto a Electrical engineer un formulario para que el seguro cubra el medicamento que se considera necesario.   Si se requiere una autorizacin previa para que su compaa de seguros [place] su medicamento, por favor permtanos de 1 a 2 das hbiles para completar este proceso.  Los  precios de los medicamentos varan con frecuencia dependiendo del Environmental consultant de dnde se surte la receta y alguna farmacias pueden ofrecer precios ms baratos.  El sitio web www.goodrx.com tiene cupones para medicamentos de Airline pilot. Los precios aqu no tienen en cuenta lo que podra costar con la ayuda del seguro (puede ser ms barato con su seguro), pero el sitio web puede darle el precio si no utiliz Research scientist (physical sciences).  - Puede imprimir el cupn correspondiente y llevarlo con su receta a la farmacia.  - Tambin puede pasar por nuestra oficina durante el horario de atencin regular y Charity fundraiser una tarjeta de cupones de GoodRx.  - Si necesita que su receta se enve electrnicamente a una farmacia diferente, informe a nuestra oficina a travs de MyChart de [place] o por telfono llamando al [phone] y presione la opcin 4.

## 2021-06-23 NOTE — Progress Notes (Signed)
Follow-Up Visit   Subjective  Erika Ochoa is a 88 y.o. female who presents for the following: Follow-up (Patient here today concerning a spot at left side of face she had an SCC treated in November by [facility]. Patient has history of scc at several locations at face. She reports area is still sore and burns. Patient also has several areas at face that are itchy she would like checked. ).  The following portions of the chart were reviewed this encounter and updated as appropriate:  Tobacco   Allergies   Meds   Problems   Med Hx   Surg Hx   Fam Hx      Review of Systems: No other skin or systemic complaints except as noted in HPI or Assessment and Plan.  Objective  Well appearing patient in no apparent distress; mood and affect are within normal limits.  A focused examination was performed including face, left cheek . Relevant physical exam findings are noted in the Assessment and Plan.  left cheek 1.3 cm hyperkeratotic crusted papule   face x 5 (5) Erythematous stuck-on, waxy papule or plaque  face x 3 (3) Erythematous thin papules/macules with gritty scale.    Assessment & Plan  Neoplasm of uncertain behavior -recurrent SCC left cheek Destruction of lesion Complexity: extensive   Destruction method: electrodesiccation and curettage   Informed consent: discussed and consent obtained   Timeout:  patient name, date of birth, surgical site, and procedure verified Procedure prep:  Patient was prepped and draped in usual sterile fashion Prep type:  Isopropyl alcohol Anesthesia: the lesion was anesthetized in a standard fashion   Anesthetic:  1% lidocaine w/ epinephrine 1-100,000 buffered w/ 8.4% NaHCO3 Curettage performed in three different directions: Yes   Electrodesiccation performed over the curetted area: Yes   Lesion length (cm):  1.3 Lesion width (cm):  1.3 Margin per side (cm):  0.2 Final wound size (cm):  1.7 Hemostasis achieved with:  pressure, aluminum chloride and  electrodesiccation Outcome: patient tolerated procedure well with no complications   Post-procedure details: sterile dressing applied and wound care instructions given   Dressing type: bandage and petrolatum    mupirocin ointment (BACTROBAN) 2 % Apply 1 application topically daily. Apply to wound at left cheek and cover until healed  Epidermal / dermal shaving  Lesion diameter (cm):  1.3 Informed consent: discussed and consent obtained   Timeout: patient name, date of birth, surgical site, and procedure verified   Procedure prep:  Patient was prepped and draped in usual sterile fashion Prep type:  Isopropyl alcohol Anesthesia: the lesion was anesthetized in a standard fashion   Anesthetic:  1% lidocaine w/ epinephrine 1-100,000 buffered w/ 8.4% NaHCO3 Instrument used: flexible razor blade   Hemostasis achieved with: pressure, aluminum chloride and electrodesiccation   Outcome: patient tolerated procedure well   Post-procedure details: sterile dressing applied and wound care instructions given   Dressing type: bandage and petrolatum    Specimen 1 - Surgical pathology Differential Diagnosis: r/o recurrent scc   Accession: [id number] Check Margins: No  R/o recurrent scc   History of Biopsy proven scc at left cheek  Accession: [id number] WELL DIFFERENTIATED SQUAMOUS CELL CARCINOMA, ACANTHOLYTIC (ADENOID) VARIANT, ULCERATED ED&C done at 04/23/21  Pending biopsy results may need excision will re-evaluate in 6 weeks   Inflamed seborrheic keratosis (5) face x 5 Irritated  Destruction of lesion - face x 5 Complexity: simple   Destruction method: cryotherapy   Informed consent: discussed and consent obtained  Timeout:  patient name, date of birth, surgical site, and procedure verified Lesion destroyed using liquid nitrogen: Yes   Region frozen until ice ball extended beyond lesion: Yes   Outcome: patient tolerated procedure well with no complications   Post-procedure  details: wound care instructions given   Additional details:  Prior to procedure, discussed risks of blister formation, small wound, skin dyspigmentation, or rare scar following cryotherapy. Recommend Vaseline ointment to treated areas while healing.  Actinic keratosis (3) face x 3 Actinic keratoses are precancerous spots that appear secondary to cumulative UV radiation exposure/sun exposure over time. They are chronic with expected duration over 1 year. A portion of actinic keratoses will progress to squamous cell carcinoma of the skin. It is not possible to reliably predict which spots will progress to skin cancer and so treatment is recommended to prevent development of skin cancer. Recommend daily broad spectrum sunscreen SPF 30+ to sun-exposed areas, reapply every 2 hours as needed.  Recommend staying in the shade or wearing long sleeves, sun glasses (UVA+UVB protection) and wide brim hats (4-inch brim around the entire circumference of the hat). Call for new or changing lesions.  Destruction of lesion - face x 3 Complexity: simple   Destruction method: cryotherapy   Informed consent: discussed and consent obtained   Timeout:  patient name, date of birth, surgical site, and procedure verified Lesion destroyed using liquid nitrogen: Yes   Region frozen until ice ball extended beyond lesion: Yes   Outcome: patient tolerated procedure well with no complications   Post-procedure details: wound care instructions given   Additional details:  Prior to procedure, discussed risks of blister formation, small wound, skin dyspigmentation, or rare scar following cryotherapy. Recommend Vaseline ointment to treated areas while healing.  Actinic Damage - chronic, secondary to cumulative UV radiation exposure/sun exposure over time - diffuse scaly erythematous macules with underlying dyspigmentation - Recommend daily broad spectrum sunscreen SPF 30+ to sun-exposed areas, reapply every 2 hours as needed.   - Recommend staying in the shade or wearing long sleeves, sun glasses (UVA+UVB protection) and wide brim hats (4-inch brim around the entire circumference of the hat). - Call for new or changing lesions.  Return for move feb appt and make 6 week follow up on edc at left cheek .  IRuthell Rummage, CMA, am acting as scribe for Sarina Ser, MD. Documentation: I have reviewed the above documentation for accuracy and completeness, and I agree with the above.  Sarina Ser, MD

## 2021-06-26 ENCOUNTER — Telehealth: Payer: Self-pay

## 2021-06-26 NOTE — Telephone Encounter (Signed)
Erika Ochoa informed of pathology results.

## 2021-06-26 NOTE — Telephone Encounter (Signed)
-----   Message from Ralene Bathe, MD sent at 06/25/2021  6:05 PM EST ----- Diagnosis Skin (M), left cheek RESIDUAL SQUAMOUS CELL CARCINOMA, DEEP MARGIN INVOLVED  Recurrent Cancer - SCC Already treated Watch for recurrence in future Keep follow up appt for recheck

## 2021-07-16 ENCOUNTER — Encounter: Payer: Medicare HMO | Admitting: Dermatology

## 2021-08-04 ENCOUNTER — Ambulatory Visit: Payer: Medicare HMO | Admitting: Dermatology

## 2021-08-04 ENCOUNTER — Encounter: Payer: Self-pay | Admitting: Dermatology

## 2021-08-04 ENCOUNTER — Other Ambulatory Visit: Payer: Self-pay

## 2021-08-04 DIAGNOSIS — L578 Other skin changes due to chronic exposure to nonionizing radiation: Secondary | ICD-10-CM

## 2021-08-04 DIAGNOSIS — L570 Actinic keratosis: Secondary | ICD-10-CM

## 2021-08-04 DIAGNOSIS — Z85828 Personal history of other malignant neoplasm of skin: Secondary | ICD-10-CM

## 2021-08-04 NOTE — Progress Notes (Signed)
° °  Follow-Up Visit   Subjective  Erika Ochoa is a 88 y.o. female who presents for the following: Follow-up (6 week recheck. SCC on left cheek. Tx with [facility] 06/23/2021. No problems with site per patient).  Daughter in law with patient.   The patient has spots, moles and lesions to be evaluated, some may be new or changing and the patient has concerns that these could be cancer.  The following portions of the chart were reviewed this encounter and updated as appropriate:  Tobacco   Allergies   Meds   Problems   Med Hx   Surg Hx   Fam Hx      Review of Systems: No other skin or systemic complaints except as noted in HPI or Assessment and Plan.  Objective  Well appearing patient in no apparent distress; mood and affect are within normal limits.  A focused examination was performed including face, neck, hands. Relevant physical exam findings are noted in the Assessment and Plan.  Forehead x3 (3) Erythematous thin papules/macules with gritty scale.    Assessment & Plan   History of Squamous Cell Carcinoma of the Skin - No evidence of recurrence today at left cheek Slightly indurated scar at area. Discussed biopsy  to evaluate for recurrence vs observation - assuming it is scar tissue. Patient prefers observation. Will recheck in 2 months - No lymphadenopathy - Recommend regular full body skin exams - Recommend daily broad spectrum sunscreen SPF 30+ to sun-exposed areas, reapply every 2 hours as needed.  - Call if any new or changing lesions are noted between office visits  Actinic Damage - chronic, secondary to cumulative UV radiation exposure/sun exposure over time - diffuse scaly erythematous macules with underlying dyspigmentation - Recommend daily broad spectrum sunscreen SPF 30+ to sun-exposed areas, reapply every 2 hours as needed.  - Recommend staying in the shade or wearing long sleeves, sun glasses (UVA+UVB protection) and wide brim hats (4-inch brim around the entire  circumference of the hat). - Call for new or changing lesions.  AK (actinic keratosis) (3) Forehead x3  Actinic keratoses are precancerous spots that appear secondary to cumulative UV radiation exposure/sun exposure over time. They are chronic with expected duration over 1 year. A portion of actinic keratoses will progress to squamous cell carcinoma of the skin. It is not possible to reliably predict which spots will progress to skin cancer and so treatment is recommended to prevent development of skin cancer.  Recommend daily broad spectrum sunscreen SPF 30+ to sun-exposed areas, reapply every 2 hours as needed.  Recommend staying in the shade or wearing long sleeves, sun glasses (UVA+UVB protection) and wide brim hats (4-inch brim around the entire circumference of the hat). Call for new or changing lesions.  Destruction of lesion - Forehead x3 Complexity: simple   Destruction method: cryotherapy   Informed consent: discussed and consent obtained   Timeout:  patient name, date of birth, surgical site, and procedure verified Lesion destroyed using liquid nitrogen: Yes   Region frozen until ice ball extended beyond lesion: Yes   Outcome: patient tolerated procedure well with no complications   Post-procedure details: wound care instructions given     Return in about 2 months (around 10/02/2021) for AK Follow Up, SCC Follow Up.  I, Emelia Salisbury, CMA, am acting as scribe for Sarina Ser, MD. Documentation: I have reviewed the above documentation for accuracy and completeness, and I agree with the above.  Sarina Ser, MD

## 2021-08-04 NOTE — Patient Instructions (Addendum)
Cryotherapy Aftercare  Wash gently with soap and water everyday.   Apply Vaseline and Band-Aid daily until healed.   Prior to procedure, discussed risks of blister formation, small wound, skin dyspigmentation, or rare scar following cryotherapy. Recommend Vaseline ointment to treated areas while healing.    If You Need Anything After Your Visit  If you have any questions or concerns for your doctor, please call our main line at [phone] and press option 4 to reach your doctor's medical assistant. If no one answers, please leave a voicemail as directed and we will return your call as soon as possible. Messages left after 4 pm will be answered the following business day.   You may also send [place] a message via [place]. We typically respond to MyChart messages within 1-2 business days.  For prescription refills, please ask your pharmacy to contact our office. Our fax number is [phone].  If you have an urgent issue when the clinic is closed that cannot wait until the next business day, you can page your doctor at the number below.    Please note that while we do our best to be available for urgent issues outside of office hours, we are not available 24/7.   If you have an urgent issue and are unable to reach [place], you may choose to seek medical care at your doctor's office, retail clinic, urgent care center, or emergency room.  If you have a medical emergency, please immediately call 911 or go to the emergency department.  Pager Numbers  - Dr. Nehemiah Massed: [phone]  - Dr. Laurence Ferrari: [phone]  - Dr. Nicole Kindred: [phone]  In the event of inclement weather, please call our main line at [phone] for an update on the status of any delays or closures.  Dermatology Medication Tips: Please keep the boxes that topical medications come in in order to help keep track of the instructions about where and how to use these. Pharmacies typically print the medication instructions only on the  boxes and not directly on the medication tubes.   If your medication is too expensive, please contact our office at [phone] option 4 or send [place] a message through [place].   We are unable to tell what your co-pay for medications will be in advance as this is different depending on your insurance coverage. However, we may be able to find a substitute medication at lower cost or fill out paperwork to get insurance to cover a needed medication.   If a prior authorization is required to get your medication covered by your insurance company, please allow [place] 1-2 business days to complete this process.  Drug prices often vary depending on where the prescription is filled and some pharmacies may offer cheaper prices.  The website www.goodrx.com contains coupons for medications through different pharmacies. The prices here do not account for what the cost may be with help from insurance (it may be cheaper with your insurance), but the website can give you the price if you did not use any insurance.  - You can print the associated coupon and take it with your prescription to the pharmacy.  - You may also stop by our office during regular business hours and pick up a GoodRx coupon card.  - If you need your prescription sent electronically to a different pharmacy, notify our office through [facility] or by phone at [phone] option 4.     Si Usted Necesita Algo Despus de Su Visita  Tambin puede enviarnos un mensaje a  travs de MyChart. Por lo general respondemos a los mensajes de MyChart en el transcurso de 1 a 2 das hbiles.  Para renovar recetas, por favor pida a su farmacia que se ponga en contacto con nuestra oficina. Harland Dingwall de fax es [place] [phone].  Si tiene un asunto urgente cuando la clnica est cerrada y que no puede esperar hasta el siguiente da hbil, puede llamar/localizar a su doctor(a) al nmero que aparece a continuacin.   Por favor, tenga en cuenta que  aunque hacemos todo lo posible para estar disponibles para asuntos urgentes fuera del horario de [place], no estamos disponibles las 24 horas del da, los 7 das de la [place].   Si tiene un problema urgente y no puede comunicarse con nosotros, puede optar por buscar atencin mdica  en el consultorio de su doctor(a), en una clnica privada, en un centro de atencin urgente o en una sala de emergencias.  Si tiene Engineering geologist, por favor llame inmediatamente al 911 o vaya a la sala de emergencias.  Nmeros de bper  - Dr. Nehemiah Massed: [phone]  - Dra. Moye: [phone]  - Dra. Nicole Kindred: [phone]  En caso de inclemencias del [place], por favor llame a Johnsie Kindred principal al [phone] para una actualizacin sobre el [place] de cualquier retraso o cierre.  Consejos para la medicacin en dermatologa: Por favor, guarde las cajas en las que vienen los medicamentos de uso tpico para ayudarle a seguir las instrucciones sobre dnde y cmo usarlos. Las farmacias generalmente imprimen las instrucciones del medicamento slo en las cajas y no directamente en los tubos del [place].   Si su medicamento es muy caro, por favor, pngase en contacto con Zigmund Daniel llamando al [phone] y presione la opcin 4 o envenos un mensaje a travs de Pharmacist, community.   No podemos decirle cul ser su copago por los medicamentos por adelantado ya que esto es diferente dependiendo de la cobertura de su seguro. Sin embargo, es posible que podamos encontrar un medicamento sustituto a Electrical engineer un formulario para que el seguro cubra el medicamento que se considera necesario.   Si se requiere una autorizacin previa para que su compaa de seguros [place] su medicamento, por favor permtanos de 1 a 2 das hbiles para completar este proceso.  Los precios de los medicamentos varan con frecuencia dependiendo del Environmental consultant de dnde se surte la receta y alguna farmacias pueden ofrecer precios ms  baratos.  El sitio web www.goodrx.com tiene cupones para medicamentos de Airline pilot. Los precios aqu no tienen en cuenta lo que podra costar con la ayuda del seguro (puede ser ms barato con su seguro), pero el sitio web puede darle el precio si no utiliz Research scientist (physical sciences).  - Puede imprimir el cupn correspondiente y llevarlo con su receta a la farmacia.  - Tambin puede pasar por nuestra oficina durante el horario de atencin regular y Charity fundraiser una tarjeta de cupones de GoodRx.  - Si necesita que su receta se enve electrnicamente a una farmacia diferente, informe a nuestra oficina a travs de MyChart de [place] o por telfono llamando al [phone] y presione la opcin 4.

## 2021-08-07 ENCOUNTER — Encounter: Payer: Self-pay | Admitting: Dermatology

## 2021-10-06 ENCOUNTER — Ambulatory Visit: Payer: Medicare HMO | Admitting: Dermatology

## 2021-10-06 DIAGNOSIS — D692 Other nonthrombocytopenic purpura: Secondary | ICD-10-CM | POA: Diagnosis not present

## 2021-10-06 DIAGNOSIS — L570 Actinic keratosis: Secondary | ICD-10-CM

## 2021-10-06 DIAGNOSIS — Z85828 Personal history of other malignant neoplasm of skin: Secondary | ICD-10-CM

## 2021-10-06 DIAGNOSIS — L578 Other skin changes due to chronic exposure to nonionizing radiation: Secondary | ICD-10-CM

## 2021-10-06 NOTE — Patient Instructions (Signed)

## 2021-10-06 NOTE — Progress Notes (Signed)
? ?  Follow-Up Visit ?  ?Subjective  ?Erika Ochoa is a 88 y.o. female who presents for the following: Actinic Keratosis (2 month follow up of forehead treated with LN2) and Follow-up (2 month follow up - Recheck SCC site of left cheek). ?The patient has spots, moles and lesions to be evaluated, some may be new or changing and the patient has concerns that these could be cancer. ? ?Accompanied by daughter in law ? ?The following portions of the chart were reviewed this encounter and updated as appropriate:  ? Tobacco  Allergies  Meds  Problems  Med Hx  Surg Hx  Fam Hx   ?  ?Review of Systems:  No other skin or systemic complaints except as noted in HPI or Assessment and Plan. ? ?Objective  ?Well appearing patient in no apparent distress; mood and affect are within normal limits. ? ?A focused examination was performed including face, arms, hands. Relevant physical exam findings are noted in the Assessment and Plan. ? ?Left cheek ?Well healed scar ? ?Left Forehead x 2, left cheek x 2, left temple x 1 (5) ?Erythematous thin papules/macules with gritty scale.  ? ? ?Assessment & Plan  ? ?Actinic Damage ?- chronic, secondary to cumulative UV radiation exposure/sun exposure over time ?- diffuse scaly erythematous macules with underlying dyspigmentation ?- Recommend daily broad spectrum sunscreen SPF 30+ to sun-exposed areas, reapply every 2 hours as needed.  ?- Recommend staying in the shade or wearing long sleeves, sun glasses (UVA+UVB protection) and wide brim hats (4-inch brim around the entire circumference of the hat). ?- Call for new or changing lesions. ? ?Purpura - Chronic; persistent and recurrent.  Treatable, but not curable. ?- Violaceous macules and patches ?- Benign ?- Related to trauma, age, sun damage and/or use of blood thinners, chronic use of topical and/or oral steroids ?- Observe ?- Can use OTC arnica containing moisturizer such as Dermend Bruise Formula if desired ?- Call for worsening or other  concerns ? ?History of SCC (squamous cell carcinoma) of skin ?Left cheek ?Clear. Observe for recurrence. Call clinic for new or changing lesions.  Recommend regular skin exams, daily broad-spectrum spf 30+ sunscreen use, and photoprotection.   ? ?AK (actinic keratosis) (5) ?Left Forehead x 2, left cheek x 2, left temple x 1 ? ?Destruction of lesion - Left Forehead x 2, left cheek x 2, left temple x 1 ?Complexity: simple   ?Destruction method: cryotherapy   ?Informed consent: discussed and consent obtained   ?Timeout:  patient name, date of birth, surgical site, and procedure verified ?Lesion destroyed using liquid nitrogen: Yes   ?Region frozen until ice ball extended beyond lesion: Yes   ?Outcome: patient tolerated procedure well with no complications   ?Post-procedure details: wound care instructions given   ? ?Return in about 1 year (around 10/07/2022). ? ?I, Ashok Cordia, CMA, am acting as scribe for Sarina Ser, MD . ?Documentation: I have reviewed the above documentation for accuracy and completeness, and I agree with the above. ? ?Sarina Ser, MD ? ?

## 2021-10-14 ENCOUNTER — Encounter: Payer: Self-pay | Admitting: Dermatology

## 2022-02-18 ENCOUNTER — Other Ambulatory Visit: Payer: Self-pay | Admitting: Family Medicine

## 2022-02-18 DIAGNOSIS — K118 Other diseases of salivary glands: Secondary | ICD-10-CM

## 2022-02-25 ENCOUNTER — Ambulatory Visit
Admission: RE | Admit: 2022-02-25 | Discharge: 2022-02-25 | Disposition: A | Discharge status: Home / Self Care | Payer: Medicare HMO | Source: Ambulatory Visit | Attending: Family Medicine | Admitting: Family Medicine

## 2022-02-25 DIAGNOSIS — K118 Other diseases of salivary glands: Secondary | ICD-10-CM | POA: Diagnosis not present

## 2022-03-25 ENCOUNTER — Other Ambulatory Visit: Payer: Self-pay | Admitting: Unknown Physician Specialty

## 2022-03-25 ENCOUNTER — Ambulatory Visit
Admission: RE | Admit: 2022-03-25 | Discharge: 2022-03-25 | Disposition: A | Discharge status: Home / Self Care | Payer: Medicare HMO | Source: Ambulatory Visit | Attending: Unknown Physician Specialty | Admitting: Unknown Physician Specialty

## 2022-03-25 DIAGNOSIS — D37030 Neoplasm of uncertain behavior of the parotid salivary glands: Secondary | ICD-10-CM

## 2022-03-25 LAB — POCT I-STAT CREATININE: Creatinine, Ser: 1.8 mg/dL — ABNORMAL HIGH (ref 0.44–1.00)

## 2022-03-25 MED ORDER — IOHEXOL 300 MG/ML  SOLN: 75.0000 mL | Freq: Once | INTRAMUSCULAR | Status: DC | PRN

## 2022-03-27 ENCOUNTER — Other Ambulatory Visit: Payer: Self-pay | Admitting: Unknown Physician Specialty

## 2022-03-27 DIAGNOSIS — D37030 Neoplasm of uncertain behavior of the parotid salivary glands: Secondary | ICD-10-CM

## 2022-04-01 NOTE — Progress Notes (Signed)
Patient for Erika Ochoa guided Core LT Parotid Gland Biopsy on Thurs 04/02/22, I called and spoke with patient's family on phone to give pre-procedure instructions. Pt's family was made aware to be here at 2p, Local so pt can eat and drink prior to procedure. Pt's family stated understanding.  Called 04/01/22

## 2022-04-02 ENCOUNTER — Ambulatory Visit
Admission: RE | Admit: 2022-04-02 | Discharge: 2022-04-02 | Disposition: A | Discharge status: Home / Self Care | Payer: Medicare HMO | Source: Ambulatory Visit | Attending: Unknown Physician Specialty | Admitting: Unknown Physician Specialty

## 2022-04-02 VITALS — BP 160/55 | HR 59

## 2022-04-02 DIAGNOSIS — D37030 Neoplasm of uncertain behavior of the parotid salivary glands: Secondary | ICD-10-CM | POA: Diagnosis present

## 2022-04-02 MED ORDER — LIDOCAINE HCL (PF) 1 % IJ SOLN
5.0000 mL | Freq: Once | INTRAMUSCULAR | Status: AC
  Administered 2022-04-02: 5 mL via INTRADERMAL

## 2022-04-07 LAB — AEROBIC/ANAEROBIC CULTURE W GRAM STAIN (SURGICAL/DEEP WOUND): Culture: NO GROWTH

## 2022-04-07 LAB — SURGICAL PATHOLOGY

## 2022-04-15 ENCOUNTER — Other Ambulatory Visit: Payer: Self-pay | Admitting: Unknown Physician Specialty

## 2022-04-15 DIAGNOSIS — K118 Other diseases of salivary glands: Secondary | ICD-10-CM

## 2022-04-15 DIAGNOSIS — C07 Malignant neoplasm of parotid gland: Secondary | ICD-10-CM

## 2022-04-16 ENCOUNTER — Ambulatory Visit
Admission: RE | Admit: 2022-04-16 | Discharge: 2022-04-16 | Disposition: A | Discharge status: Home / Self Care | Payer: Medicare HMO | Source: Ambulatory Visit | Attending: Unknown Physician Specialty | Admitting: Unknown Physician Specialty

## 2022-04-16 ENCOUNTER — Encounter: Payer: Self-pay | Admitting: Oncology

## 2022-04-16 ENCOUNTER — Inpatient Hospital Stay: Payer: Medicare HMO

## 2022-04-16 ENCOUNTER — Inpatient Hospital Stay: Payer: Medicare HMO | Attending: Oncology | Admitting: Oncology

## 2022-04-16 VITALS — BP 185/68 | HR 60 | Temp 97.6°F | Resp 14 | Ht 66.0 in | Wt 152.0 lb

## 2022-04-16 DIAGNOSIS — Z79899 Other long term (current) drug therapy: Secondary | ICD-10-CM | POA: Diagnosis not present

## 2022-04-16 DIAGNOSIS — I119 Hypertensive heart disease without heart failure: Secondary | ICD-10-CM | POA: Diagnosis not present

## 2022-04-16 DIAGNOSIS — K118 Other diseases of salivary glands: Secondary | ICD-10-CM | POA: Insufficient documentation

## 2022-04-16 DIAGNOSIS — Z7982 Long term (current) use of aspirin: Secondary | ICD-10-CM | POA: Insufficient documentation

## 2022-04-16 DIAGNOSIS — Z7984 Long term (current) use of oral hypoglycemic drugs: Secondary | ICD-10-CM | POA: Diagnosis not present

## 2022-04-16 DIAGNOSIS — Z85828 Personal history of other malignant neoplasm of skin: Secondary | ICD-10-CM | POA: Diagnosis not present

## 2022-04-16 DIAGNOSIS — C07 Malignant neoplasm of parotid gland: Secondary | ICD-10-CM | POA: Insufficient documentation

## 2022-04-16 DIAGNOSIS — I517 Cardiomegaly: Secondary | ICD-10-CM | POA: Insufficient documentation

## 2022-04-16 DIAGNOSIS — I2510 Atherosclerotic heart disease of native coronary artery without angina pectoris: Secondary | ICD-10-CM | POA: Diagnosis not present

## 2022-04-16 DIAGNOSIS — Z7902 Long term (current) use of antithrombotics/antiplatelets: Secondary | ICD-10-CM | POA: Insufficient documentation

## 2022-04-16 DIAGNOSIS — R911 Solitary pulmonary nodule: Secondary | ICD-10-CM | POA: Diagnosis not present

## 2022-04-16 DIAGNOSIS — K449 Diaphragmatic hernia without obstruction or gangrene: Secondary | ICD-10-CM | POA: Diagnosis not present

## 2022-04-16 DIAGNOSIS — E119 Type 2 diabetes mellitus without complications: Secondary | ICD-10-CM | POA: Diagnosis not present

## 2022-04-16 DIAGNOSIS — I700 Atherosclerosis of aorta: Secondary | ICD-10-CM | POA: Diagnosis not present

## 2022-04-16 DIAGNOSIS — L570 Actinic keratosis: Secondary | ICD-10-CM | POA: Diagnosis not present

## 2022-04-16 DIAGNOSIS — Z79890 Hormone replacement therapy (postmenopausal): Secondary | ICD-10-CM | POA: Diagnosis not present

## 2022-04-16 DIAGNOSIS — J439 Emphysema, unspecified: Secondary | ICD-10-CM | POA: Insufficient documentation

## 2022-04-16 LAB — GLUCOSE, CAPILLARY: Glucose-Capillary: 120 mg/dL — ABNORMAL HIGH (ref 70–99)

## 2022-04-16 MED ORDER — FLUDEOXYGLUCOSE F - 18 (FDG) INJECTION
7.9000 | Freq: Once | INTRAVENOUS | Status: AC | PRN
  Administered 2022-04-16: 8.51 via INTRAVENOUS

## 2022-04-17 DIAGNOSIS — C07 Malignant neoplasm of parotid gland: Secondary | ICD-10-CM | POA: Insufficient documentation

## 2022-04-17 NOTE — Progress Notes (Signed)
[place]  [phone] [phone]  ID: Mack Thurmon Blackston OB: [birth year]  MR#: [id number]  HBZ#:[id number]  Patient Care Team: [place] Pink, MD as PCP - General (Family Medicine)  CHIEF COMPLAINT: Stage II carcinoma left parotid gland.  INTERVAL HISTORY: Patient is a 88 year old female who noted a mass on her left neck that was increasing in size.  She also has a history of skin cancer removed near the same area.  Subsequent biopsy confirmed malignancy.  She currently feels well and is otherwise asymptomatic.  She has no neurologic complaints.  She denies any recent fevers or illnesses.  She has a good appetite and denies weight loss.  She has no chest pain, shortness of breath, cough, or hemoptysis.  She denies any nausea, vomiting, constipation, or diarrhea.  She has no urinary complaints.  Patient otherwise feels well and offers no further specific complaints today.  REVIEW OF SYSTEMS:   Review of Systems  Constitutional: Negative.  Negative for fever, malaise/fatigue and weight loss.  Respiratory: Negative.  Negative for cough, hemoptysis and shortness of breath.   Cardiovascular: Negative.  Negative for chest pain and leg swelling.  Gastrointestinal: Negative.  Negative for abdominal pain.  Genitourinary: Negative.  Negative for dysuria.  Musculoskeletal:  Negative for neck pain.  Skin: Negative.  Negative for rash.  Neurological: Negative.  Negative for dizziness, focal weakness, weakness and headaches.  Psychiatric/Behavioral: Negative.  The patient is not nervous/anxious.    As per HPI. Otherwise, a complete review of systems is negative.   PAST MEDICAL HISTORY: Past Medical History:  Diagnosis Date   Abnormal mammogram, unspecified    Actinic keratosis    Breast screening, unspecified    Diabetes ([place])    Hypertension    Mammographic microcalcification    Squamous cell carcinoma of skin 06/14/2019   Left superior forehead. Well  differentiated. Tx: EDC   Squamous cell carcinoma of skin 09/13/2019   L of midline med forehead at hairline SCCIS    Squamous cell carcinoma of skin 04/23/2021   Left cheek, recurrent on 06/23/21 tx with [facility] again   Vaginal pessary present     PAST SURGICAL HISTORY: Past Surgical History:  Procedure Laterality Date   BREAST BIOPSY Left 2013   stereo calcs   BREAST EXCISIONAL BIOPSY Left    "years ago"-benign   ESOPHAGOGASTRODUODENOSCOPY Left 06/12/2018   Procedure: ESOPHAGOGASTRODUODENOSCOPY (EGD);  Surgeon: Virgel Manifold, MD;  Location: [facility] ENDOSCOPY;  Service: Endoscopy;  Laterality: Left;   ESOPHAGOGASTRODUODENOSCOPY N/A 08/31/2019   Procedure: ESOPHAGOGASTRODUODENOSCOPY (EGD);  Surgeon: Toledo, Benay Pike, MD;  Location: [facility] ENDOSCOPY;  Service: Gastroenterology;  Laterality: N/A;   SKIN LESION EXCISION      FAMILY HISTORY: Family History  Problem Relation Age of Onset   Cancer Mother    Breast cancer Neg Hx     ADVANCED DIRECTIVES (Y/N):  N  HEALTH MAINTENANCE: Social History   Tobacco Use   Smoking status: Never   Smokeless tobacco: Never  Vaping Use   Vaping Use: Never used  Substance Use Topics   Alcohol use: No    Alcohol/week: 0.0 standard drinks of alcohol   Drug use: No     Colonoscopy:  PAP:  Bone density:  Lipid panel:  Allergies  Allergen Reactions   Oxycodone Hcl Other (See Comments)    Other Reaction: GI Upset Other reaction(s): Other (See Comments) Other Reaction: GI Upset    Oxycodone-Aspirin Other (See Comments)    Other Reaction: GI Upset (Per  pt she does take aspirin 81 mg daily   Augmentin [Amoxicillin-Pot Clavulanate] Nausea Only   Codeine Nausea Only   Etodolac Other (See Comments)   Iodine Nausea Only   Levaquin [Levofloxacin In D5w] Nausea Only   Levofloxacin Nausea Only   Metronidazole Nausea Only   Ultram [Tramadol] Nausea Only   Sulfa Antibiotics Nausea Only and Other (See Comments)    Other reaction(s): Unknown      Current Outpatient Medications  Medication Sig Dispense Refill   aspirin EC 81 MG EC tablet Take 1 tablet (81 mg total) by mouth daily. Swallow whole. 30 tablet 0   clopidogrel (PLAVIX) 75 MG tablet Take 1 tablet (75 mg total) by mouth daily. 30 tablet 0   diltiazem (CARDIZEM CD) 120 MG 24 hr capsule Take 1 capsule (120 mg total) by mouth daily. 30 capsule 0   donepezil (ARICEPT) 5 MG tablet Take 1 tablet by mouth every evening.     ferrous sulfate 325 (65 FE) MG EC tablet Take 1 tablet (325 mg total) by mouth 2 (two) times daily. 60 tablet 3   glipiZIDE (GLUCOTROL XL) 5 MG 24 hr tablet Take 5 mg by mouth 2 (two) times daily.      isosorbide mononitrate (IMDUR) 30 MG 24 hr tablet Take 1 tablet (30 mg total) by mouth daily. 30 tablet 0   LEVEMIR FLEXPEN 100 UNIT/ML FlexPen Inject into the skin.     levothyroxine (SYNTHROID, LEVOTHROID) 50 MCG tablet Take 50 mcg by mouth daily.      Magnesium Oxide (MAG-OXIDE) 200 MG TABS Take 2 tablets (400 mg total) by mouth 2 (two) times daily. 10 tablet 0   metoprolol tartrate (LOPRESSOR) 50 MG tablet Take 1 tablet (50 mg total) by mouth 2 (two) times daily. 60 tablet 0   nitroGLYCERIN (NITROSTAT) 0.4 MG SL tablet Place 1 tablet (0.4 mg total) under the tongue every 5 (five) minutes x 3 doses as needed for chest pain (max 3). 30 tablet 0   pantoprazole (PROTONIX) 40 MG tablet Take 1 tablet (40 mg total) by mouth daily. 30 tablet 1   pioglitazone (ACTOS) 30 MG tablet Take 30 mg by mouth daily.     TRADJENTA 5 MG TABS tablet Take 5 mg by mouth daily.      Vitamin D, Ergocalciferol, (DRISDOL) 1.25 MG (50000 UT) CAPS capsule Take 50,000 Units by mouth every Saturday.      irbesartan-hydrochlorothiazide (AVALIDE) 300-12.5 MG tablet Take 1 tablet by mouth daily.     No current facility-administered medications for this visit.    OBJECTIVE: Vitals:   04/16/22 0945  BP: (!) 185/68  Pulse: 60  Resp: 14  Temp: 97.6 F (36.4 C)  SpO2: 98%     Body mass  index is 24.53 kg/m.    ECOG FS:0 - Asymptomatic  General: Well-developed, well-nourished, no acute distress. Eyes: Pink conjunctiva, anicteric sclera. HEENT: Normocephalic, moist mucous membranes.  Easily palpable left parotid mass.  No palpable lymphadenopathy. Lungs: No audible wheezing or coughing. Heart: Regular rate and rhythm. Abdomen: Soft, nontender, no obvious distention. Musculoskeletal: No edema, cyanosis, or clubbing. Neuro: Alert, answering all questions appropriately. Cranial nerves grossly intact. Skin: No rashes or petechiae noted. Psych: Normal affect. Lymphatics: No cervical, calvicular, axillary or inguinal LAD.   LAB RESULTS:  Lab Results  Component Value Date   NA 135 10/17/2020   K 3.8 10/17/2020   CL 102 10/17/2020   CO2 22 10/17/2020   GLUCOSE 243 (H) 10/17/2020  BUN 32 (H) 10/17/2020   CREATININE 1.80 (H) 03/25/2022   CALCIUM 9.1 10/17/2020   PROT 6.4 (L) 09/18/2020   ALBUMIN 3.3 (L) 09/18/2020   AST 19 09/18/2020   ALT 17 09/18/2020   ALKPHOS 83 09/18/2020   BILITOT 1.1 09/18/2020   GFRNONAA 28 (L) 10/17/2020   GFRAA 32 (L) 11/07/2019    Lab Results  Component Value Date   WBC 8.8 10/17/2020   NEUTROABS 7.8 (H) 09/18/2020   HGB 12.3 10/17/2020   HCT 36.0 10/17/2020   MCV 98.4 10/17/2020   PLT 224 10/17/2020     STUDIES: NM PET Image Initial (PI) Skull Base To Thigh (F-18 FDG)  Result Date: 04/16/2022 CLINICAL DATA:  Initial treatment strategy for LEFT parotid neoplasm. EXAM: NUCLEAR MEDICINE PET SKULL BASE TO THIGH TECHNIQUE: 8.51 mCi F-18 FDG was injected intravenously. Full-ring PET imaging was performed from the skull base to thigh after the radiotracer. CT data was obtained and used for attenuation correction and anatomic localization. Fasting blood glucose: 120 mg/dl COMPARISON:  CT of the neck of March 25, 2022. FINDINGS: Mediastinal blood pool activity: SUV max 2.11 Liver activity: SUV max NA NECK: Mass in the superficial  anterior parotid gland along the LEFT face measuring 3.0 x 2.3 cm previously 2.4 x 1.5 cm. This shows central low attenuation and corresponding lack of FDG uptake in the central portion with intense FDG uptake at the periphery with a maximum SUV of 6.35. No additional signs of increased metabolic activity in the neck. Incidental CT findings: Sphenoid sinus disease with chronic appearance unchanged from recent imaging. CHEST: No hypermetabolic mediastinal or hilar nodes. 8 mm RIGHT upper lobe pulmonary nodule (image 84/2) no increased metabolic activity associated with this pulmonary nodule. Maximum SUV 1.07. Incidental CT findings: Aortic atherosclerosis, moderate cardiomegaly and three-vessel coronary artery disease. Pulmonary emphysema. No consolidation or effusion. Large hiatal hernia extends into the chest. ABDOMEN/PELVIS: No abnormal hypermetabolic activity within the liver, pancreas, adrenal glands, or spleen. No hypermetabolic lymph nodes in the abdomen or pelvis. Incidental CT findings: No acute findings relative to liver, gallbladder, pancreas, spleen, adrenal glands and kidneys. There is moderate to marked pancreatic parenchymal atrophy and renal cortical scarring of the bilateral kidneys with parenchymal thinning also moderate to marked. Large hiatal hernia, slightly greater than 50% of the stomach herniated into the chest. Colonic diverticulosis. Sigmoid diverticular disease. Cystic lesion in the RIGHT adnexa 6.2 x 4.6 cm showing water density on this noncontrast CT associated with the PET evaluation. No increased metabolic activity. The this is increased in size since June of 2016 but it was as large as 5 cm on the 2016 evaluation. No ascites. SKELETON: No focal hypermetabolic activity to suggest skeletal metastasis. Incidental CT findings: Spinal degenerative changes. IMPRESSION: 1. Hypermetabolic mass in the superficial anterior LEFT parotid gland along the LEFT face measuring 3.0 x 2.3 cm previously  2.4 x 1.5 cm. Findings are compatible with neoplasm either primary or metastatic but without additional areas of increased metabolic activity. 2. No increased metabolic activity associated with the RIGHT adnexal cystic lesion, which has increased in size since June of 2016 but it was as large as 5 cm on the 2016 evaluation. This remains indeterminate but is either benign or indolent. Consider the following. Recommend follow-up US in 6-12 months. Note: This recommendation does not apply to premenarchal patients and to those with increased risk (genetic, family history, elevated tumor markers or other high-risk factors) of ovarian cancer. Reference: JACR 2020 Feb; 17(2):248-254 3. 8  mm RIGHT upper lobe pulmonary nodule without substantial increased metabolic activity. Suggest close attention on subsequent imaging, consider short interval follow-up with chest CT. 4. Large hiatal hernia. 5. Pulmonary emphysema. 6. Aortic atherosclerosis, moderate cardiomegaly, and three-vessel coronary artery disease. Aortic Atherosclerosis (ICD10-I70.0) and Emphysema (ICD10-J43.9). Electronically Signed   By: Zetta Bills M.D.   On: 04/16/2022 14:07   [place] CORE BIOPSY (SALIVARY GLAND/PAROTID GLAND)  Result Date: 04/02/2022 INDICATION: Left parotid mass EXAM: Ultrasound-guided aspiration of left parotid mass Ultrasound-guided core needle biopsy of left parotid mass MEDICATIONS: None. ANESTHESIA/SEDATION: Local analgesia COMPLICATIONS: None immediate. PROCEDURE: Informed written consent was obtained from the patient after a thorough discussion of the procedural risks, benefits and alternatives. All questions were addressed. Maximal Sterile Barrier Technique was utilized including caps, mask, sterile gowns, sterile gloves, sterile drape, hand hygiene and skin antiseptic. A timeout was performed prior to the initiation of the procedure. The patient was placed supine on the exam table. Ultrasound of the left parotid gland demonstrated  hypoechoic lesion with fluid-filled/cystic center. Skin entry site was marked, and the overlying skin was prepped draped in the standard sterile fashion. Local analgesia was obtained with 1% lidocaine. Decision was made to proceed with ultrasound-guided aspiration of the fluid fluid/cystic center. Using ultrasound guidance, a 19 gauge Yueh needle was advanced into the central cavity, with removal of 1-2 mL of cloudy serosanguineous fluid. Following this, using ultrasound guidance, core needle biopsy was performed of the left parotid gland lesion using an 18 gauge core biopsy device x4 total passes. Specimens were submitted in formalin to pathology for further handling. Limited postprocedure imaging demonstrated no hematoma. A clean dressing was placed after manual hemostasis. The patient tolerated the procedure well without immediate complication. IMPRESSION: 1. Successful ultrasound-guided aspiration of left parotid gland lesion with removal of 1-2 mL of cloudy serosanguineous fluid. Sample sent for microbiology analysis. 2. Successful ultrasound-guided core needle biopsy of left parotid gland lesion. Sample sent for pathology analysis. Electronically Signed   By: Albin Felling M.D.   On: 04/02/2022 15:37   CT SOFT TISSUE NECK WO CONTRAST  Result Date: 03/25/2022 CLINICAL DATA:  Parotid gland mass noticed 2 months ago. History of prior basal cell carcinoma. Previous thyroidectomy. EXAM: CT NECK WITHOUT CONTRAST TECHNIQUE: Multidetector CT imaging of the neck was performed following the standard protocol without intravenous contrast. RADIATION DOSE REDUCTION: This exam was performed according to the departmental dose-optimization program which includes automated exposure control, adjustment of the mA and/or kV according to patient size and/or use of iterative reconstruction technique. COMPARISON:  Ultrasound 02/25/2022.  Cervical CT 03/03/2021. FINDINGS: Pharynx and larynx: No mucosal or submucosal lesion.  Salivary glands: Submandibular glands are normal. Right parotid gland is normal. At the anterior margin of the superficial lobe of the left parotid gland, there is a 2.7 x 2.0 x 1.7 cm irregular marginated mass. The differential diagnosis is malignant parotid neoplasm versus metastatic lymphadenopathy. The mass has a broad surface along the lateral margin of the masseter muscle without a clear interface. Thyroid: Previous right thyroidectomy. Lymph nodes: No other lymphadenopathy on either side of the neck. Vascular: Atherosclerotic calcification at the carotid bifurcation regions. Limited intracranial: Normal Visualized orbits: Normal Mastoids and visualized paranasal sinuses: Complete opacification of the right division of the sphenoid sinus with chronic mucoperiosteal thickening. The other sinuses are clear. Skeleton: Ordinary cervical spondylosis. Upper chest: Pleural and parenchymal scarring at the lung apices. Other: None IMPRESSION: 1. 2.7 x 2.0 x 1.7 cm irregular marginated mass at the anterior  margin of the superficial lobe of the left parotid gland. The differential diagnosis is malignant parotid neoplasm versus metastatic lymphadenopathy. The mass has a broad surface along the lateral margin of the masseter muscle without a clear interface. 2. Previous right thyroidectomy. 3. Complete opacification of the right division of the sphenoid sinus with chronic mucoperiosteal thickening. Electronically Signed   By: Nelson Chimes M.D.   On: 03/25/2022 14:28    ASSESSMENT: Stage II carcinoma left parotid gland  PLAN:    Stage II carcinoma left parotid gland: Biopsy results reviewed independently.  PET scan completed on March 25, 2022 reviewed independently and reported as above 2.7 cm irregular hypermetabolic mass in the left parotid gland.  Given stage of disease, patient will benefit from treatment with XRT and referral has been made to radiation oncology.  Unclear the benefit of concurrent chemotherapy  with cisplatin given her advanced age as well as increased creatinine of 1.8.  Patient will return to clinic in 1 week to discuss her PET scan results and treatment planning.  I spent a total of 60 minutes reviewing chart data, face-to-face evaluation with the patient, counseling and coordination of care as detailed above.   Patient expressed understanding and was in agreement with this plan. She also understands that She can call clinic at any time with any questions, concerns, or complaints.    Cancer Staging  Carcinoma of parotid gland [facility]) Staging form: Major Salivary Glands, AJCC 8th Edition - Clinical stage from 04/17/2022: Stage II (cT2, cN0, cM0) - Signed by Lloyd Huger, MD on 04/17/2022 Stage prefix: Initial diagnosis   Lloyd Huger, MD   04/17/2022 6:37 AM

## 2022-04-21 ENCOUNTER — Ambulatory Visit
Admission: RE | Admit: 2022-04-21 | Discharge: 2022-04-21 | Disposition: A | Discharge status: Home / Self Care | Payer: Medicare HMO | Source: Ambulatory Visit | Attending: Radiation Oncology | Admitting: Radiation Oncology

## 2022-04-21 ENCOUNTER — Inpatient Hospital Stay: Payer: Medicare HMO

## 2022-04-21 ENCOUNTER — Inpatient Hospital Stay (HOSPITAL_BASED_OUTPATIENT_CLINIC_OR_DEPARTMENT_OTHER): Payer: Medicare HMO | Admitting: Oncology

## 2022-04-21 ENCOUNTER — Encounter: Payer: Self-pay | Admitting: Oncology

## 2022-04-21 VITALS — BP 160/60 | HR 74 | Temp 98.1°F | Resp 18 | Wt 153.0 lb

## 2022-04-21 DIAGNOSIS — I10 Essential (primary) hypertension: Secondary | ICD-10-CM | POA: Insufficient documentation

## 2022-04-21 DIAGNOSIS — C07 Malignant neoplasm of parotid gland: Secondary | ICD-10-CM | POA: Insufficient documentation

## 2022-04-21 DIAGNOSIS — Z7982 Long term (current) use of aspirin: Secondary | ICD-10-CM | POA: Insufficient documentation

## 2022-04-21 DIAGNOSIS — Z85828 Personal history of other malignant neoplasm of skin: Secondary | ICD-10-CM | POA: Insufficient documentation

## 2022-04-21 DIAGNOSIS — Z79890 Hormone replacement therapy (postmenopausal): Secondary | ICD-10-CM | POA: Insufficient documentation

## 2022-04-21 DIAGNOSIS — Z7902 Long term (current) use of antithrombotics/antiplatelets: Secondary | ICD-10-CM | POA: Insufficient documentation

## 2022-04-21 DIAGNOSIS — Z7984 Long term (current) use of oral hypoglycemic drugs: Secondary | ICD-10-CM | POA: Insufficient documentation

## 2022-04-21 DIAGNOSIS — E119 Type 2 diabetes mellitus without complications: Secondary | ICD-10-CM | POA: Insufficient documentation

## 2022-04-21 DIAGNOSIS — K118 Other diseases of salivary glands: Secondary | ICD-10-CM

## 2022-04-21 DIAGNOSIS — Z79899 Other long term (current) drug therapy: Secondary | ICD-10-CM | POA: Insufficient documentation

## 2022-04-21 LAB — CBC WITH DIFFERENTIAL/PLATELET
Abs Immature Granulocytes: 0.02 10*3/uL (ref 0.00–0.07)
Basophils Absolute: 0.1 10*3/uL (ref 0.0–0.1)
Basophils Relative: 1 %
Eosinophils Absolute: 0.1 10*3/uL (ref 0.0–0.5)
Eosinophils Relative: 1 %
HCT: 36.8 % (ref 36.0–46.0)
Hemoglobin: 12.3 g/dL (ref 12.0–15.0)
Immature Granulocytes: 0 %
Lymphocytes Relative: 18 %
Lymphs Abs: 1.6 10*3/uL (ref 0.7–4.0)
MCH: 32.7 pg (ref 26.0–34.0)
MCHC: 33.4 g/dL (ref 30.0–36.0)
MCV: 97.9 fL (ref 80.0–100.0)
Monocytes Absolute: 0.9 10*3/uL (ref 0.1–1.0)
Monocytes Relative: 10 %
Neutro Abs: 5.9 10*3/uL (ref 1.7–7.7)
Neutrophils Relative %: 70 %
Platelets: 187 10*3/uL (ref 150–400)
RBC: 3.76 MIL/uL — ABNORMAL LOW (ref 3.87–5.11)
RDW: 14.7 % (ref 11.5–15.5)
WBC: 8.5 10*3/uL (ref 4.0–10.5)
nRBC: 0 % (ref 0.0–0.2)

## 2022-04-21 LAB — COMPREHENSIVE METABOLIC PANEL
ALT: 12 U/L (ref 0–44)
AST: 21 U/L (ref 15–41)
Albumin: 3.5 g/dL (ref 3.5–5.0)
Alkaline Phosphatase: 73 U/L (ref 38–126)
Anion gap: 7 (ref 5–15)
BUN: 32 mg/dL — ABNORMAL HIGH (ref 8–23)
CO2: 26 mmol/L (ref 22–32)
Calcium: 8.6 mg/dL — ABNORMAL LOW (ref 8.9–10.3)
Chloride: 106 mmol/L (ref 98–111)
Creatinine, Ser: 1.81 mg/dL — ABNORMAL HIGH (ref 0.44–1.00)
GFR, Estimated: 25 mL/min — ABNORMAL LOW (ref >60)
Glucose, Bld: 174 mg/dL — ABNORMAL HIGH (ref 70–99)
Potassium: 4 mmol/L (ref 3.5–5.1)
Sodium: 139 mmol/L (ref 135–145)
Total Bilirubin: 0.9 mg/dL (ref 0.3–1.2)
Total Protein: 6.6 g/dL (ref 6.5–8.1)

## 2022-04-21 NOTE — Addendum Note (Signed)
Addended by: Sofie Rower A on: 04/21/2022 11:03 AM   Modules accepted: Orders

## 2022-04-21 NOTE — Progress Notes (Signed)
Here for PET results Has a protruding nodule on R cheek from parotid gland tumor. It is tender if she rubs it. No difficulty opening her mouth and eating. Appetite is normal. Some fatigue, dyspnea with exertion. Has consult with Dr Baruch Gouty in radiation after this appt.

## 2022-04-21 NOTE — Progress Notes (Signed)
[place]  [phone] [phone]  ID: Erika Ochoa OB: Aug 30, 1924  MR#: [id number]  NID#:[id number]  Patient Care Team: [place] Pink, MD as PCP - General (Family Medicine)  CHIEF COMPLAINT: Stage II carcinoma left parotid gland.  INTERVAL HISTORY: Patient returns to clinic today for further evaluation, discussion of her imaging results, and treatment planning.  She continues to feel well and remains asymptomatic. She has no neurologic complaints.  She denies any recent fevers or illnesses.  She has a good appetite and denies weight loss.  She has no chest pain, shortness of breath, cough, or hemoptysis.  She denies any nausea, vomiting, constipation, or diarrhea.  She has no urinary complaints.  Patient offers no further specific complaints today.  REVIEW OF SYSTEMS:   Review of Systems  Constitutional: Negative.  Negative for fever, malaise/fatigue and weight loss.  Respiratory: Negative.  Negative for cough, hemoptysis and shortness of breath.   Cardiovascular: Negative.  Negative for chest pain and leg swelling.  Gastrointestinal: Negative.  Negative for abdominal pain.  Genitourinary: Negative.  Negative for dysuria.  Musculoskeletal:  Negative for neck pain.  Skin: Negative.  Negative for rash.  Neurological: Negative.  Negative for dizziness, focal weakness, weakness and headaches.  Psychiatric/Behavioral: Negative.  The patient is not nervous/anxious.    As per HPI. Otherwise, a complete review of systems is negative.   PAST MEDICAL HISTORY: Past Medical History:  Diagnosis Date   Abnormal mammogram, unspecified    Actinic keratosis    Breast screening, unspecified    Diabetes ([employer])    Hypertension    Mammographic microcalcification    Squamous cell carcinoma of skin 06/14/2019   Left superior forehead. Well differentiated. Tx: EDC   Squamous cell carcinoma of skin 09/13/2019   L of midline med forehead at hairline SCCIS     Squamous cell carcinoma of skin 04/23/2021   Left cheek, recurrent on 06/23/21 tx with [facility] again   Vaginal pessary present     PAST SURGICAL HISTORY: Past Surgical History:  Procedure Laterality Date   BREAST BIOPSY Left 2013   stereo calcs   BREAST EXCISIONAL BIOPSY Left    "years ago"-benign   ESOPHAGOGASTRODUODENOSCOPY Left 06/12/2018   Procedure: ESOPHAGOGASTRODUODENOSCOPY (EGD);  Surgeon: Virgel Manifold, MD;  Location: [facility] ENDOSCOPY;  Service: Endoscopy;  Laterality: Left;   ESOPHAGOGASTRODUODENOSCOPY N/A 08/31/2019   Procedure: ESOPHAGOGASTRODUODENOSCOPY (EGD);  Surgeon: Toledo, Benay Pike, MD;  Location: [facility] ENDOSCOPY;  Service: Gastroenterology;  Laterality: N/A;   SKIN LESION EXCISION      FAMILY HISTORY: Family History  Problem Relation Age of Onset   Cancer Mother    Breast cancer Neg Hx     ADVANCED DIRECTIVES (Y/N):  N  HEALTH MAINTENANCE: Social History   Tobacco Use   Smoking status: Never   Smokeless tobacco: Never  Vaping Use   Vaping Use: Never used  Substance Use Topics   Alcohol use: No    Alcohol/week: 0.0 standard drinks of alcohol   Drug use: No     Colonoscopy:  PAP:  Bone density:  Lipid panel:  Allergies  Allergen Reactions   Oxycodone Hcl Other (See Comments)    Other Reaction: GI Upset Other reaction(s): Other (See Comments) Other Reaction: GI Upset    Oxycodone-Aspirin Other (See Comments)    Other Reaction: GI Upset (Per pt she does take aspirin 81 mg daily   Augmentin [Amoxicillin-Pot Clavulanate] Nausea Only   Codeine Nausea Only   Etodolac Other (See Comments)  Iodine Nausea Only   Levaquin [Levofloxacin In D5w] Nausea Only   Levofloxacin Nausea Only   Metronidazole Nausea Only   Ultram [Tramadol] Nausea Only   Sulfa Antibiotics Nausea Only and Other (See Comments)    Other reaction(s): Unknown     Current Outpatient Medications  Medication Sig Dispense Refill   aspirin EC 81 MG EC tablet Take 1 tablet (81 mg  total) by mouth daily. Swallow whole. 30 tablet 0   clopidogrel (PLAVIX) 75 MG tablet Take 1 tablet (75 mg total) by mouth daily. 30 tablet 0   diltiazem (CARDIZEM CD) 120 MG 24 hr capsule Take 1 capsule (120 mg total) by mouth daily. 30 capsule 0   donepezil (ARICEPT) 5 MG tablet Take 1 tablet by mouth every evening.     glipiZIDE (GLUCOTROL XL) 5 MG 24 hr tablet Take 5 mg by mouth 2 (two) times daily.      irbesartan-hydrochlorothiazide (AVALIDE) 300-12.5 MG tablet Take 1 tablet by mouth daily.     isosorbide mononitrate (IMDUR) 30 MG 24 hr tablet Take 1 tablet (30 mg total) by mouth daily. 30 tablet 0   LEVEMIR FLEXPEN 100 UNIT/ML FlexPen Inject into the skin.     levothyroxine (SYNTHROID, LEVOTHROID) 50 MCG tablet Take 50 mcg by mouth daily.      Magnesium Oxide (MAG-OXIDE) 200 MG TABS Take 2 tablets (400 mg total) by mouth 2 (two) times daily. 10 tablet 0   metoprolol tartrate (LOPRESSOR) 50 MG tablet Take 1 tablet (50 mg total) by mouth 2 (two) times daily. 60 tablet 0   nitroGLYCERIN (NITROSTAT) 0.4 MG SL tablet Place 1 tablet (0.4 mg total) under the tongue every 5 (five) minutes x 3 doses as needed for chest pain (max 3). 30 tablet 0   pioglitazone (ACTOS) 30 MG tablet Take 30 mg by mouth daily.     TRADJENTA 5 MG TABS tablet Take 5 mg by mouth daily.      Vitamin D, Ergocalciferol, (DRISDOL) 1.25 MG (50000 UT) CAPS capsule Take 50,000 Units by mouth every Saturday.      ferrous sulfate 325 (65 FE) MG EC tablet Take 1 tablet (325 mg total) by mouth 2 (two) times daily. 60 tablet 3   pantoprazole (PROTONIX) 40 MG tablet Take 1 tablet (40 mg total) by mouth daily. 30 tablet 1   No current facility-administered medications for this visit.    OBJECTIVE: Vitals:   04/21/22 0853  BP: (!) 160/60  Pulse: 74  Resp: 18  Temp: 98.1 F (36.7 C)  SpO2: 96%     Body mass index is 24.69 kg/m.    ECOG FS:0 - Asymptomatic  General: Well-developed, well-nourished, no acute distress. Eyes:  Pink conjunctiva, anicteric sclera. HEENT: Normocephalic, moist mucous membranes.  Easily palpable left parotid mass.  No palpable lymphadenopathy. Lungs: No audible wheezing or coughing. Heart: Regular rate and rhythm. Abdomen: Soft, nontender, no obvious distention. Musculoskeletal: No edema, cyanosis, or clubbing. Neuro: Alert, answering all questions appropriately. Cranial nerves grossly intact. Skin: No rashes or petechiae noted. Psych: Normal affect.    LAB RESULTS:  Lab Results  Component Value Date   NA 139 04/21/2022   K 4.0 04/21/2022   CL 106 04/21/2022   CO2 26 04/21/2022   GLUCOSE 174 (H) 04/21/2022   BUN 32 (H) 04/21/2022   CREATININE 1.81 (H) 04/21/2022   CALCIUM 8.6 (L) 04/21/2022   PROT 6.6 04/21/2022   ALBUMIN 3.5 04/21/2022   AST 21 04/21/2022   ALT 12  04/21/2022   ALKPHOS 73 04/21/2022   BILITOT 0.9 04/21/2022   GFRNONAA 25 (L) 04/21/2022   GFRAA 32 (L) 11/07/2019    Lab Results  Component Value Date   WBC 8.5 04/21/2022   NEUTROABS 5.9 04/21/2022   HGB 12.3 04/21/2022   HCT 36.8 04/21/2022   MCV 97.9 04/21/2022   PLT 187 04/21/2022     STUDIES: NM PET Image Initial (PI) Skull Base To Thigh (F-18 FDG)  Result Date: 04/16/2022 CLINICAL DATA:  Initial treatment strategy for LEFT parotid neoplasm. EXAM: NUCLEAR MEDICINE PET SKULL BASE TO THIGH TECHNIQUE: 8.51 mCi F-18 FDG was injected intravenously. Full-ring PET imaging was performed from the skull base to thigh after the radiotracer. CT data was obtained and used for attenuation correction and anatomic localization. Fasting blood glucose: 120 mg/dl COMPARISON:  CT of the neck of March 25, 2022. FINDINGS: Mediastinal blood pool activity: SUV max 2.11 Liver activity: SUV max NA NECK: Mass in the superficial anterior parotid gland along the LEFT face measuring 3.0 x 2.3 cm previously 2.4 x 1.5 cm. This shows central low attenuation and corresponding lack of FDG uptake in the central portion with  intense FDG uptake at the periphery with a maximum SUV of 6.35. No additional signs of increased metabolic activity in the neck. Incidental CT findings: Sphenoid sinus disease with chronic appearance unchanged from recent imaging. CHEST: No hypermetabolic mediastinal or hilar nodes. 8 mm RIGHT upper lobe pulmonary nodule (image 84/2) no increased metabolic activity associated with this pulmonary nodule. Maximum SUV 1.07. Incidental CT findings: Aortic atherosclerosis, moderate cardiomegaly and three-vessel coronary artery disease. Pulmonary emphysema. No consolidation or effusion. Large hiatal hernia extends into the chest. ABDOMEN/PELVIS: No abnormal hypermetabolic activity within the liver, pancreas, adrenal glands, or spleen. No hypermetabolic lymph nodes in the abdomen or pelvis. Incidental CT findings: No acute findings relative to liver, gallbladder, pancreas, spleen, adrenal glands and kidneys. There is moderate to marked pancreatic parenchymal atrophy and renal cortical scarring of the bilateral kidneys with parenchymal thinning also moderate to marked. Large hiatal hernia, slightly greater than 50% of the stomach herniated into the chest. Colonic diverticulosis. Sigmoid diverticular disease. Cystic lesion in the RIGHT adnexa 6.2 x 4.6 cm showing water density on this noncontrast CT associated with the PET evaluation. No increased metabolic activity. The this is increased in size since June of 2016 but it was as large as 5 cm on the 2016 evaluation. No ascites. SKELETON: No focal hypermetabolic activity to suggest skeletal metastasis. Incidental CT findings: Spinal degenerative changes. IMPRESSION: 1. Hypermetabolic mass in the superficial anterior LEFT parotid gland along the LEFT face measuring 3.0 x 2.3 cm previously 2.4 x 1.5 cm. Findings are compatible with neoplasm either primary or metastatic but without additional areas of increased metabolic activity. 2. No increased metabolic activity associated  with the RIGHT adnexal cystic lesion, which has increased in size since June of 2016 but it was as large as 5 cm on the 2016 evaluation. This remains indeterminate but is either benign or indolent. Consider the following. Recommend follow-up US in 6-12 months. Note: This recommendation does not apply to premenarchal patients and to those with increased risk (genetic, family history, elevated tumor markers or other high-risk factors) of ovarian cancer. Reference: JACR 2020 Feb; 17(2):248-254 3. 8 mm RIGHT upper lobe pulmonary nodule without substantial increased metabolic activity. Suggest close attention on subsequent imaging, consider short interval follow-up with chest CT. 4. Large hiatal hernia. 5. Pulmonary emphysema. 6. Aortic atherosclerosis, moderate cardiomegaly, and  three-vessel coronary artery disease. Aortic Atherosclerosis (ICD10-I70.0) and Emphysema (ICD10-J43.9). Electronically Signed   By: Zetta Bills M.D.   On: 04/16/2022 14:07   [place] CORE BIOPSY (SALIVARY GLAND/PAROTID GLAND)  Result Date: 04/02/2022 INDICATION: Left parotid mass EXAM: Ultrasound-guided aspiration of left parotid mass Ultrasound-guided core needle biopsy of left parotid mass MEDICATIONS: None. ANESTHESIA/SEDATION: Local analgesia COMPLICATIONS: None immediate. PROCEDURE: Informed written consent was obtained from the patient after a thorough discussion of the procedural risks, benefits and alternatives. All questions were addressed. Maximal Sterile Barrier Technique was utilized including caps, mask, sterile gowns, sterile gloves, sterile drape, hand hygiene and skin antiseptic. A timeout was performed prior to the initiation of the procedure. The patient was placed supine on the exam table. Ultrasound of the left parotid gland demonstrated hypoechoic lesion with fluid-filled/cystic center. Skin entry site was marked, and the overlying skin was prepped draped in the standard sterile fashion. Local analgesia was obtained with  1% lidocaine. Decision was made to proceed with ultrasound-guided aspiration of the fluid fluid/cystic center. Using ultrasound guidance, a 19 gauge Yueh needle was advanced into the central cavity, with removal of 1-2 mL of cloudy serosanguineous fluid. Following this, using ultrasound guidance, core needle biopsy was performed of the left parotid gland lesion using an 18 gauge core biopsy device x4 total passes. Specimens were submitted in formalin to pathology for further handling. Limited postprocedure imaging demonstrated no hematoma. A clean dressing was placed after manual hemostasis. The patient tolerated the procedure well without immediate complication. IMPRESSION: 1. Successful ultrasound-guided aspiration of left parotid gland lesion with removal of 1-2 mL of cloudy serosanguineous fluid. Sample sent for microbiology analysis. 2. Successful ultrasound-guided core needle biopsy of left parotid gland lesion. Sample sent for pathology analysis. Electronically Signed   By: Albin Felling M.D.   On: 04/02/2022 15:37   CT SOFT TISSUE NECK WO CONTRAST  Result Date: 03/25/2022 CLINICAL DATA:  Parotid gland mass noticed 2 months ago. History of prior basal cell carcinoma. Previous thyroidectomy. EXAM: CT NECK WITHOUT CONTRAST TECHNIQUE: Multidetector CT imaging of the neck was performed following the standard protocol without intravenous contrast. RADIATION DOSE REDUCTION: This exam was performed according to the departmental dose-optimization program which includes automated exposure control, adjustment of the mA and/or kV according to patient size and/or use of iterative reconstruction technique. COMPARISON:  Ultrasound 02/25/2022.  Cervical CT 03/03/2021. FINDINGS: Pharynx and larynx: No mucosal or submucosal lesion. Salivary glands: Submandibular glands are normal. Right parotid gland is normal. At the anterior margin of the superficial lobe of the left parotid gland, there is a 2.7 x 2.0 x 1.7 cm  irregular marginated mass. The differential diagnosis is malignant parotid neoplasm versus metastatic lymphadenopathy. The mass has a broad surface along the lateral margin of the masseter muscle without a clear interface. Thyroid: Previous right thyroidectomy. Lymph nodes: No other lymphadenopathy on either side of the neck. Vascular: Atherosclerotic calcification at the carotid bifurcation regions. Limited intracranial: Normal Visualized orbits: Normal Mastoids and visualized paranasal sinuses: Complete opacification of the right division of the sphenoid sinus with chronic mucoperiosteal thickening. The other sinuses are clear. Skeleton: Ordinary cervical spondylosis. Upper chest: Pleural and parenchymal scarring at the lung apices. Other: None IMPRESSION: 1. 2.7 x 2.0 x 1.7 cm irregular marginated mass at the anterior margin of the superficial lobe of the left parotid gland. The differential diagnosis is malignant parotid neoplasm versus metastatic lymphadenopathy. The mass has a broad surface along the lateral margin of the masseter muscle without a clear  interface. 2. Previous right thyroidectomy. 3. Complete opacification of the right division of the sphenoid sinus with chronic mucoperiosteal thickening. Electronically Signed   By: Nelson Chimes M.D.   On: 03/25/2022 14:28    ASSESSMENT: Stage II carcinoma left parotid gland  PLAN:    Stage II carcinoma left parotid gland: Biopsy results reviewed independently.  PET scan completed on March 25, 2022 reviewed independently and reported as above 2.7 cm irregular hypermetabolic mass in the left parotid gland.  Given stage of disease, patient will benefit from treatment with XRT and she has an appointment with radiation oncology today.  Given stage of disease, patient's age, and elevated creatinine, will not give concurrent cisplatin.  Proceed with XRT only.  Return to clinic in approximately 1 month for repeat laboratory work and further evaluation.  Repeat  PET scan 8 to 10 weeks after completion of XRT.  Renal insufficiency: Patient's most recent creatinine is 1.8.  No cisplatin as above.  I spent a total of 30 minutes reviewing chart data, face-to-face evaluation with the patient, counseling and coordination of care as detailed above.  Patient expressed understanding and was in agreement with this plan. She also understands that She can call clinic at any time with any questions, concerns, or complaints.    Cancer Staging  Carcinoma of parotid gland [facility]) Staging form: Major Salivary Glands, AJCC 8th Edition - Clinical stage from 04/17/2022: Stage II (cT2, cN0, cM0) - Signed by Lloyd Huger, MD on 04/17/2022 Stage prefix: Initial diagnosis   Lloyd Huger, MD   04/21/2022 9:08 AM

## 2022-04-21 NOTE — Consult Note (Signed)
NEW PATIENT EVALUATION  Name: Erika Ochoa  MRN: [id number]  Date:   04/21/2022     DOB: 1924-09-30   This 88 y.o. 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  REFERRING PHYSICIAN: Maryland Pink, MD  CHIEF COMPLAINT: No chief complaint on file.   DIAGNOSIS: The encounter diagnosis was Carcinoma of parotid gland ([place]).   PREVIOUS INVESTIGATIONS:  PET CT scan reviewed Pathology reports reviewed Clinical notes reviewed  HPI: Patient is a 88 year old female who noticed a progressive mass in the left parotid bed over the last several months.  It is tender to the touch otherwise nonpainful.  She had a history of a skin cancer removed near her left facial region previously.  CT scan demonstrated 2.7 x 2 x 1.7 cm irregular marginated mass at the anterior margin of the superficial lobe of the left parotid gland.  PET CT scan demonstrated hypermetabolic activity in the left parotid gland corresponding to the area of findings on CT scan.  There was no evidence of metastatic adenopathy within the head and neck although there is some slight hypermetabolic activity in some of the lymph node chain on my review.  Patient had ultrasound-guided biopsy of the left parotid gland showing moderately differentiated caramels carcinoma with squamous differentiation and keratinization she is having no facial drop or evidence of facial nerve paralysis at this time.  She is now referred to ration collagen for consideration of treatment.  She has been seen by medical oncology who based on her age is declining systemic treatment.  PLANNED TREATMENT REGIMEN: External beam IMRT radiation therapy  PAST MEDICAL HISTORY:  has a past medical history of Abnormal mammogram, unspecified, Actinic keratosis, Breast screening, unspecified, Diabetes ([place]), Hypertension, Mammographic microcalcification, Squamous cell carcinoma  of skin (06/14/2019), Squamous cell carcinoma of skin (09/13/2019), Squamous cell carcinoma of skin (04/23/2021), and Vaginal pessary present.    PAST SURGICAL HISTORY:  Past Surgical History:  Procedure Laterality Date   BREAST BIOPSY Left 2013   stereo calcs   BREAST EXCISIONAL BIOPSY Left    "years ago"-benign   ESOPHAGOGASTRODUODENOSCOPY Left 06/12/2018   Procedure: ESOPHAGOGASTRODUODENOSCOPY (EGD);  Surgeon: Virgel Manifold, MD;  Location: [facility] ENDOSCOPY;  Service: Endoscopy;  Laterality: Left;   ESOPHAGOGASTRODUODENOSCOPY N/A 08/31/2019   Procedure: ESOPHAGOGASTRODUODENOSCOPY (EGD);  Surgeon: Toledo, Benay Pike, MD;  Location: [facility] ENDOSCOPY;  Service: Gastroenterology;  Laterality: N/A;   SKIN LESION EXCISION      FAMILY HISTORY: family history includes Cancer in her mother.  SOCIAL HISTORY:  reports that she has never smoked. She has never used smokeless tobacco. She reports that she does not drink alcohol and does not use drugs.  ALLERGIES: Oxycodone hcl, Oxycodone-aspirin, Augmentin [amoxicillin-pot clavulanate], Codeine, Etodolac, Iodine, Levaquin [levofloxacin in d5w], Levofloxacin, Metronidazole, Ultram [tramadol], and Sulfa antibiotics  MEDICATIONS:  Current Outpatient Medications  Medication Sig Dispense Refill   aspirin EC 81 MG EC tablet Take 1 tablet (81 mg total) by mouth daily. Swallow whole. 30 tablet 0   clopidogrel (PLAVIX) 75 MG tablet Take 1 tablet (75 mg total) by mouth daily. 30 tablet 0   diltiazem (CARDIZEM CD) 120 MG 24 hr capsule Take 1 capsule (120 mg total) by mouth daily. 30 capsule 0   donepezil (ARICEPT) 5 MG tablet Take 1 tablet by mouth every evening.     ferrous sulfate 325 (65 FE) MG EC tablet Take 1 tablet (325 mg total) by  mouth 2 (two) times daily. 60 tablet 3   glipiZIDE (GLUCOTROL XL) 5 MG 24 hr tablet Take 5 mg by mouth 2 (two) times daily.      irbesartan-hydrochlorothiazide (AVALIDE) 300-12.5 MG tablet Take 1 tablet by mouth daily.      isosorbide mononitrate (IMDUR) 30 MG 24 hr tablet Take 1 tablet (30 mg total) by mouth daily. 30 tablet 0   LEVEMIR FLEXPEN 100 UNIT/ML FlexPen Inject into the skin.     levothyroxine (SYNTHROID, LEVOTHROID) 50 MCG tablet Take 50 mcg by mouth daily.      Magnesium Oxide (MAG-OXIDE) 200 MG TABS Take 2 tablets (400 mg total) by mouth 2 (two) times daily. 10 tablet 0   metoprolol tartrate (LOPRESSOR) 50 MG tablet Take 1 tablet (50 mg total) by mouth 2 (two) times daily. 60 tablet 0   nitroGLYCERIN (NITROSTAT) 0.4 MG SL tablet Place 1 tablet (0.4 mg total) under the tongue every 5 (five) minutes x 3 doses as needed for chest pain (max 3). 30 tablet 0   pantoprazole (PROTONIX) 40 MG tablet Take 1 tablet (40 mg total) by mouth daily. 30 tablet 1   pioglitazone (ACTOS) 30 MG tablet Take 30 mg by mouth daily.     TRADJENTA 5 MG TABS tablet Take 5 mg by mouth daily.      Vitamin D, Ergocalciferol, (DRISDOL) 1.25 MG (50000 UT) CAPS capsule Take 50,000 Units by mouth every Saturday.      No current facility-administered medications for this encounter.    ECOG PERFORMANCE STATUS:  1 - Symptomatic but completely ambulatory  REVIEW OF SYSTEMS: Patient denies any weight loss, fatigue, weakness, fever, chills or night sweats. Patient denies any loss of vision, blurred vision. Patient denies any ringing  of the ears or hearing loss. No irregular heartbeat. Patient denies heart murmur or history of fainting. Patient denies any chest pain or pain radiating to her upper extremities. Patient denies any shortness of breath, difficulty breathing at night, cough or hemoptysis. Patient denies any swelling in the lower legs. Patient denies any nausea vomiting, vomiting of blood, or coffee ground material in the vomitus. Patient denies any stomach pain. Patient states has had normal bowel movements no significant constipation or diarrhea. Patient denies any dysuria, hematuria or significant nocturia. Patient denies any  problems walking, swelling in the joints or loss of balance. Patient denies any skin changes, loss of hair or loss of weight. Patient denies any excessive worrying or anxiety or significant depression. Patient denies any problems with insomnia. Patient denies excessive thirst, polyuria, polydipsia. Patient denies any swollen glands, patient denies easy bruising or easy bleeding. Patient denies any recent infections, allergies or URI. Patient "s visual fields have not changed significantly in recent time.   PHYSICAL EXAM: There were no vitals taken for this visit. There is a fixed at least 3 cm mass in the left parotid bed.  Tender to the touch.  No cervical or supraclavicular adenopathy is appreciated.  Well-developed well-nourished patient in NAD. HEENT reveals PERLA, EOMI, discs not visualized.  Oral cavity is clear. No oral mucosal lesions are identified. Neck is clear without evidence of cervical or supraclavicular adenopathy. Lungs are clear to A&P. Cardiac examination is essentially unremarkable with regular rate and rhythm without murmur rub or thrill. Abdomen is benign with no organomegaly or masses noted. Motor sensory and DTR levels are equal and symmetric in the upper and lower extremities. Cranial nerves II through XII are grossly intact. Proprioception is intact. No peripheral  adenopathy or edema is identified. No motor or sensory levels are noted. Crude visual fields are within normal range.  LABORATORY DATA: Pathology reports reviewed    RADIOLOGY RESULTS: CT scans ultrasound and PET scan reviewed compatible with above-stated findings   IMPRESSION: Squamous cell carcinoma of the left parotid gland in 88 year old female  PLAN: At this time I go ahead with external beam IMRT radiation therapy to the left parotid gland as well as left cervical nodes.  Would plan delivering 78 Gray over 7 weeks to the left parotid gland using PET CT fusion study.  Would also treat her left cervical nodes to  54 Gray using IMRT dose painting technique.  Which is IMRT to spare critical structures such as contralateral parotid spinal cord oral cavity.  Risks and benefits of treatment including skin reaction fatigue possible oral mucositis were discussed with the patient.  She comprehends her treatment plan well.  I personally set up and ordered CT simulation.  I would like to take this opportunity to thank you for allowing me to participate in the care of your patient.Noreene Filbert, MD

## 2022-04-21 NOTE — Progress Notes (Signed)
Head and Neck - No Medical Intervention - Off Treatment.  Patient Characteristics: Oropharynx, HPV Negative/Unknown, Preoperative or Nonsurgical Candidate (Clinical Staging), Stage I - II, Not Eligible for Surgery Disease Classification: Oropharynx HPV Status: Awaiting Test Results Therapeutic Status: Preoperative or Nonsurgical Candidate (Clinical Staging) AJCC T Category: cT2 AJCC 8 Stage Grouping: II AJCC N Category: cN0 AJCC M Category: cM0 Surgical Candidacy: Not Eligible for Surgery

## 2022-04-27 ENCOUNTER — Ambulatory Visit
Admission: RE | Admit: 2022-04-27 | Discharge: 2022-04-27 | Disposition: A | Discharge status: Home / Self Care | Payer: Medicare HMO | Source: Ambulatory Visit | Attending: Radiation Oncology | Admitting: Radiation Oncology

## 2022-04-27 DIAGNOSIS — C07 Malignant neoplasm of parotid gland: Secondary | ICD-10-CM | POA: Insufficient documentation

## 2022-04-27 DIAGNOSIS — Z510 Encounter for antineoplastic radiation therapy: Secondary | ICD-10-CM | POA: Diagnosis not present

## 2022-05-05 DIAGNOSIS — Z510 Encounter for antineoplastic radiation therapy: Secondary | ICD-10-CM | POA: Diagnosis not present

## 2022-05-08 ENCOUNTER — Other Ambulatory Visit: Payer: Self-pay | Admitting: *Deleted

## 2022-05-08 DIAGNOSIS — C07 Malignant neoplasm of parotid gland: Secondary | ICD-10-CM

## 2022-05-11 ENCOUNTER — Ambulatory Visit: Admission: RE | Admit: 2022-05-11 | Payer: Medicare HMO | Source: Ambulatory Visit

## 2022-05-12 ENCOUNTER — Ambulatory Visit
Admission: RE | Admit: 2022-05-12 | Discharge: 2022-05-12 | Disposition: A | Discharge status: Home / Self Care | Payer: Medicare HMO | Source: Ambulatory Visit | Attending: Radiation Oncology | Admitting: Radiation Oncology

## 2022-05-12 ENCOUNTER — Other Ambulatory Visit: Payer: Self-pay

## 2022-05-12 DIAGNOSIS — Z510 Encounter for antineoplastic radiation therapy: Secondary | ICD-10-CM | POA: Insufficient documentation

## 2022-05-12 DIAGNOSIS — C07 Malignant neoplasm of parotid gland: Secondary | ICD-10-CM | POA: Diagnosis not present

## 2022-05-12 LAB — RAD ONC ARIA SESSION SUMMARY
Course Elapsed Days: 0
Plan Fractions Treated to Date: 1
Plan Prescribed Dose Per Fraction: 2 Gy
Plan Total Fractions Prescribed: 35
Plan Total Prescribed Dose: 70 Gy
Reference Point Dosage Given to Date: 2 Gy
Reference Point Session Dosage Given: 2 Gy
Session Number: 1

## 2022-05-13 ENCOUNTER — Ambulatory Visit
Admission: RE | Admit: 2022-05-13 | Discharge: 2022-05-13 | Disposition: A | Discharge status: Home / Self Care | Payer: Medicare HMO | Source: Ambulatory Visit | Attending: Radiation Oncology | Admitting: Radiation Oncology

## 2022-05-13 ENCOUNTER — Other Ambulatory Visit: Payer: Self-pay

## 2022-05-13 DIAGNOSIS — Z510 Encounter for antineoplastic radiation therapy: Secondary | ICD-10-CM | POA: Diagnosis not present

## 2022-05-13 LAB — RAD ONC ARIA SESSION SUMMARY
Course Elapsed Days: 1
Plan Fractions Treated to Date: 2
Plan Prescribed Dose Per Fraction: 2 Gy
Plan Total Fractions Prescribed: 35
Plan Total Prescribed Dose: 70 Gy
Reference Point Dosage Given to Date: 4 Gy
Reference Point Session Dosage Given: 2 Gy
Session Number: 2

## 2022-05-14 ENCOUNTER — Ambulatory Visit
Admission: RE | Admit: 2022-05-14 | Discharge: 2022-05-14 | Disposition: A | Discharge status: Home / Self Care | Payer: Medicare HMO | Source: Ambulatory Visit | Attending: Radiation Oncology | Admitting: Radiation Oncology

## 2022-05-14 ENCOUNTER — Other Ambulatory Visit: Payer: Self-pay

## 2022-05-14 DIAGNOSIS — Z510 Encounter for antineoplastic radiation therapy: Secondary | ICD-10-CM | POA: Diagnosis not present

## 2022-05-14 LAB — RAD ONC ARIA SESSION SUMMARY
Course Elapsed Days: 2
Plan Fractions Treated to Date: 3
Plan Prescribed Dose Per Fraction: 2 Gy
Plan Total Fractions Prescribed: 35
Plan Total Prescribed Dose: 70 Gy
Reference Point Dosage Given to Date: 6 Gy
Reference Point Session Dosage Given: 2 Gy
Session Number: 3

## 2022-05-15 ENCOUNTER — Other Ambulatory Visit: Payer: Self-pay

## 2022-05-15 ENCOUNTER — Ambulatory Visit
Admission: RE | Admit: 2022-05-15 | Discharge: 2022-05-15 | Disposition: A | Discharge status: Home / Self Care | Payer: Medicare HMO | Source: Ambulatory Visit | Attending: Radiation Oncology | Admitting: Radiation Oncology

## 2022-05-15 DIAGNOSIS — Z510 Encounter for antineoplastic radiation therapy: Secondary | ICD-10-CM | POA: Diagnosis not present

## 2022-05-15 LAB — RAD ONC ARIA SESSION SUMMARY
Course Elapsed Days: 3
Plan Fractions Treated to Date: 4
Plan Prescribed Dose Per Fraction: 2 Gy
Plan Total Fractions Prescribed: 35
Plan Total Prescribed Dose: 70 Gy
Reference Point Dosage Given to Date: 8 Gy
Reference Point Session Dosage Given: 2 Gy
Session Number: 4

## 2022-05-18 ENCOUNTER — Ambulatory Visit
Admission: RE | Admit: 2022-05-18 | Discharge: 2022-05-18 | Disposition: A | Discharge status: Home / Self Care | Payer: Medicare HMO | Source: Ambulatory Visit | Attending: Radiation Oncology | Admitting: Radiation Oncology

## 2022-05-18 ENCOUNTER — Other Ambulatory Visit: Payer: Self-pay

## 2022-05-18 DIAGNOSIS — Z510 Encounter for antineoplastic radiation therapy: Secondary | ICD-10-CM | POA: Diagnosis not present

## 2022-05-18 LAB — RAD ONC ARIA SESSION SUMMARY
Course Elapsed Days: 6
Plan Fractions Treated to Date: 5
Plan Prescribed Dose Per Fraction: 2 Gy
Plan Total Fractions Prescribed: 35
Plan Total Prescribed Dose: 70 Gy
Reference Point Dosage Given to Date: 10 Gy
Reference Point Session Dosage Given: 2 Gy
Session Number: 5

## 2022-05-19 ENCOUNTER — Ambulatory Visit
Admission: RE | Admit: 2022-05-19 | Discharge: 2022-05-19 | Disposition: A | Discharge status: Home / Self Care | Payer: Medicare HMO | Source: Ambulatory Visit | Attending: Radiation Oncology | Admitting: Radiation Oncology

## 2022-05-19 ENCOUNTER — Other Ambulatory Visit: Payer: Self-pay

## 2022-05-19 DIAGNOSIS — Z510 Encounter for antineoplastic radiation therapy: Secondary | ICD-10-CM | POA: Diagnosis not present

## 2022-05-19 LAB — RAD ONC ARIA SESSION SUMMARY
Course Elapsed Days: 7
Plan Fractions Treated to Date: 6
Plan Prescribed Dose Per Fraction: 2 Gy
Plan Total Fractions Prescribed: 35
Plan Total Prescribed Dose: 70 Gy
Reference Point Dosage Given to Date: 12 Gy
Reference Point Session Dosage Given: 2 Gy
Session Number: 6

## 2022-05-20 ENCOUNTER — Inpatient Hospital Stay: Payer: Medicare HMO | Attending: Oncology

## 2022-05-20 ENCOUNTER — Other Ambulatory Visit: Payer: Self-pay

## 2022-05-20 ENCOUNTER — Ambulatory Visit
Admission: RE | Admit: 2022-05-20 | Discharge: 2022-05-20 | Disposition: A | Discharge status: Home / Self Care | Payer: Medicare HMO | Source: Ambulatory Visit | Attending: Radiation Oncology | Admitting: Radiation Oncology

## 2022-05-20 DIAGNOSIS — G893 Neoplasm related pain (acute) (chronic): Secondary | ICD-10-CM | POA: Insufficient documentation

## 2022-05-20 DIAGNOSIS — C07 Malignant neoplasm of parotid gland: Secondary | ICD-10-CM

## 2022-05-20 DIAGNOSIS — N289 Disorder of kidney and ureter, unspecified: Secondary | ICD-10-CM | POA: Diagnosis not present

## 2022-05-20 DIAGNOSIS — T783XXA Angioneurotic edema, initial encounter: Secondary | ICD-10-CM | POA: Diagnosis not present

## 2022-05-20 DIAGNOSIS — Z510 Encounter for antineoplastic radiation therapy: Secondary | ICD-10-CM | POA: Diagnosis not present

## 2022-05-20 LAB — RAD ONC ARIA SESSION SUMMARY
Course Elapsed Days: 8
Plan Fractions Treated to Date: 7
Plan Prescribed Dose Per Fraction: 2 Gy
Plan Total Fractions Prescribed: 35
Plan Total Prescribed Dose: 70 Gy
Reference Point Dosage Given to Date: 14 Gy
Reference Point Session Dosage Given: 2 Gy
Session Number: 7

## 2022-05-20 LAB — CBC
HCT: 37.8 % (ref 36.0–46.0)
Hemoglobin: 12.5 g/dL (ref 12.0–15.0)
MCH: 32.3 pg (ref 26.0–34.0)
MCHC: 33.1 g/dL (ref 30.0–36.0)
MCV: 97.7 fL (ref 80.0–100.0)
Platelets: 236 10*3/uL (ref 150–400)
RBC: 3.87 MIL/uL (ref 3.87–5.11)
RDW: 14.6 % (ref 11.5–15.5)
WBC: 9.3 10*3/uL (ref 4.0–10.5)
nRBC: 0 % (ref 0.0–0.2)

## 2022-05-21 ENCOUNTER — Ambulatory Visit
Admission: RE | Admit: 2022-05-21 | Discharge: 2022-05-21 | Disposition: A | Discharge status: Home / Self Care | Payer: Medicare HMO | Source: Ambulatory Visit | Attending: Radiation Oncology | Admitting: Radiation Oncology

## 2022-05-21 ENCOUNTER — Other Ambulatory Visit: Payer: Self-pay

## 2022-05-21 DIAGNOSIS — Z510 Encounter for antineoplastic radiation therapy: Secondary | ICD-10-CM | POA: Diagnosis not present

## 2022-05-21 LAB — RAD ONC ARIA SESSION SUMMARY
Course Elapsed Days: 9
Plan Fractions Treated to Date: 8
Plan Prescribed Dose Per Fraction: 2 Gy
Plan Total Fractions Prescribed: 35
Plan Total Prescribed Dose: 70 Gy
Reference Point Dosage Given to Date: 16 Gy
Reference Point Session Dosage Given: 2 Gy
Session Number: 8

## 2022-05-22 ENCOUNTER — Ambulatory Visit
Admission: RE | Admit: 2022-05-22 | Discharge: 2022-05-22 | Disposition: A | Discharge status: Home / Self Care | Payer: Medicare HMO | Source: Ambulatory Visit | Attending: Radiation Oncology | Admitting: Radiation Oncology

## 2022-05-22 ENCOUNTER — Other Ambulatory Visit: Payer: Self-pay

## 2022-05-22 DIAGNOSIS — Z510 Encounter for antineoplastic radiation therapy: Secondary | ICD-10-CM | POA: Diagnosis not present

## 2022-05-22 LAB — RAD ONC ARIA SESSION SUMMARY
Course Elapsed Days: 10
Plan Fractions Treated to Date: 9
Plan Prescribed Dose Per Fraction: 2 Gy
Plan Total Fractions Prescribed: 35
Plan Total Prescribed Dose: 70 Gy
Reference Point Dosage Given to Date: 18 Gy
Reference Point Session Dosage Given: 2 Gy
Session Number: 9

## 2022-05-25 ENCOUNTER — Other Ambulatory Visit: Payer: Self-pay

## 2022-05-25 ENCOUNTER — Ambulatory Visit
Admission: RE | Admit: 2022-05-25 | Discharge: 2022-05-25 | Disposition: A | Discharge status: Home / Self Care | Payer: Medicare HMO | Source: Ambulatory Visit | Attending: Radiation Oncology | Admitting: Radiation Oncology

## 2022-05-25 DIAGNOSIS — Z510 Encounter for antineoplastic radiation therapy: Secondary | ICD-10-CM | POA: Diagnosis not present

## 2022-05-25 LAB — RAD ONC ARIA SESSION SUMMARY
Course Elapsed Days: 13
Plan Fractions Treated to Date: 10
Plan Prescribed Dose Per Fraction: 2 Gy
Plan Total Fractions Prescribed: 35
Plan Total Prescribed Dose: 70 Gy
Reference Point Dosage Given to Date: 20 Gy
Reference Point Session Dosage Given: 2 Gy
Session Number: 10

## 2022-05-26 ENCOUNTER — Inpatient Hospital Stay (HOSPITAL_BASED_OUTPATIENT_CLINIC_OR_DEPARTMENT_OTHER): Payer: Medicare HMO | Admitting: Oncology

## 2022-05-26 ENCOUNTER — Inpatient Hospital Stay: Payer: Medicare HMO

## 2022-05-26 ENCOUNTER — Ambulatory Visit
Admission: RE | Admit: 2022-05-26 | Discharge: 2022-05-26 | Disposition: A | Discharge status: Home / Self Care | Payer: Medicare HMO | Source: Ambulatory Visit | Attending: Radiation Oncology | Admitting: Radiation Oncology

## 2022-05-26 ENCOUNTER — Other Ambulatory Visit: Payer: Self-pay | Admitting: *Deleted

## 2022-05-26 ENCOUNTER — Other Ambulatory Visit: Payer: Self-pay

## 2022-05-26 VITALS — BP 149/72 | HR 62 | Temp 98.2°F | Resp 18 | Wt 146.5 lb

## 2022-05-26 DIAGNOSIS — Z510 Encounter for antineoplastic radiation therapy: Secondary | ICD-10-CM | POA: Diagnosis not present

## 2022-05-26 DIAGNOSIS — C07 Malignant neoplasm of parotid gland: Secondary | ICD-10-CM

## 2022-05-26 LAB — CBC WITH DIFFERENTIAL/PLATELET
Abs Immature Granulocytes: 0.03 10*3/uL (ref 0.00–0.07)
Basophils Absolute: 0.1 10*3/uL (ref 0.0–0.1)
Basophils Relative: 1 %
Eosinophils Absolute: 0.1 10*3/uL (ref 0.0–0.5)
Eosinophils Relative: 1 %
HCT: 37.7 % (ref 36.0–46.0)
Hemoglobin: 12.7 g/dL (ref 12.0–15.0)
Immature Granulocytes: 0 %
Lymphocytes Relative: 17 %
Lymphs Abs: 1.5 10*3/uL (ref 0.7–4.0)
MCH: 32.5 pg (ref 26.0–34.0)
MCHC: 33.7 g/dL (ref 30.0–36.0)
MCV: 96.4 fL (ref 80.0–100.0)
Monocytes Absolute: 0.8 10*3/uL (ref 0.1–1.0)
Monocytes Relative: 10 %
Neutro Abs: 6.2 10*3/uL (ref 1.7–7.7)
Neutrophils Relative %: 71 %
Platelets: 239 10*3/uL (ref 150–400)
RBC: 3.91 MIL/uL (ref 3.87–5.11)
RDW: 14.6 % (ref 11.5–15.5)
WBC: 8.7 10*3/uL (ref 4.0–10.5)
nRBC: 0 % (ref 0.0–0.2)

## 2022-05-26 LAB — RAD ONC ARIA SESSION SUMMARY
Course Elapsed Days: 14
Plan Fractions Treated to Date: 11
Plan Prescribed Dose Per Fraction: 2 Gy
Plan Total Fractions Prescribed: 35
Plan Total Prescribed Dose: 70 Gy
Reference Point Dosage Given to Date: 22 Gy
Reference Point Session Dosage Given: 2 Gy
Session Number: 11

## 2022-05-26 LAB — COMPREHENSIVE METABOLIC PANEL
ALT: 9 U/L (ref 0–44)
AST: 17 U/L (ref 15–41)
Albumin: 3.6 g/dL (ref 3.5–5.0)
Alkaline Phosphatase: 70 U/L (ref 38–126)
Anion gap: 10 (ref 5–15)
BUN: 31 mg/dL — ABNORMAL HIGH (ref 8–23)
CO2: 27 mmol/L (ref 22–32)
Calcium: 8.9 mg/dL (ref 8.9–10.3)
Chloride: 105 mmol/L (ref 98–111)
Creatinine, Ser: 1.7 mg/dL — ABNORMAL HIGH (ref 0.44–1.00)
GFR, Estimated: 27 mL/min — ABNORMAL LOW (ref >60)
Glucose, Bld: 131 mg/dL — ABNORMAL HIGH (ref 70–99)
Potassium: 3.9 mmol/L (ref 3.5–5.1)
Sodium: 142 mmol/L (ref 135–145)
Total Bilirubin: 1 mg/dL (ref 0.3–1.2)
Total Protein: 6.8 g/dL (ref 6.5–8.1)

## 2022-05-26 MED ORDER — AMOXICILLIN-POT CLAVULANATE 875-125 MG PO TABS: 1.0000 | ORAL_TABLET | Freq: Two times a day (BID) | ORAL | 0 refills | Status: DC

## 2022-05-26 NOTE — Progress Notes (Signed)
Facial nodule is larger and angry looking. Dr Baruch Gouty told pt it should be smaller by now. He is ordering a Ct scan. Pt states her face is very painful. Still able to chew and swallow. Taking ibuprofen for pain.

## 2022-05-26 NOTE — Progress Notes (Signed)
[place]  [phone] [phone]  ID: Erika Ochoa OB: 1924/08/17  MR#: [id number]  YWV#:[id number]  Patient Care Team: Maryland Pink, MD as PCP - General (Family Medicine)  CHIEF COMPLAINT: Stage II carcinoma left parotid gland.  INTERVAL HISTORY: Patient returns to clinic today for routine evaluation approximately halfway through her XRT.  Despite treatment, her left parotid mass has increased in size and radiation oncology has ordered a CT scan for further evaluation.  She has increased pain, but otherwise feels well.  She has no neurologic complaints.  She denies any recent fevers or illnesses.  She has a good appetite and denies weight loss.  She has no chest pain, shortness of breath, cough, or hemoptysis.  She denies any nausea, vomiting, constipation, or diarrhea.  She has no urinary complaints.  Patient offers no further specific complaints today.  REVIEW OF SYSTEMS:   Review of Systems  Constitutional: Negative.  Negative for fever, malaise/fatigue and weight loss.  Respiratory: Negative.  Negative for cough, hemoptysis and shortness of breath.   Cardiovascular: Negative.  Negative for chest pain and leg swelling.  Gastrointestinal: Negative.  Negative for abdominal pain.  Genitourinary: Negative.  Negative for dysuria.  Musculoskeletal:  Positive for neck pain.  Skin: Negative.  Negative for rash.  Neurological: Negative.  Negative for dizziness, focal weakness, weakness and headaches.  Psychiatric/Behavioral: Negative.  The patient is not nervous/anxious.    As per HPI. Otherwise, a complete review of systems is negative.   PAST MEDICAL HISTORY: Past Medical History:  Diagnosis Date   Abnormal mammogram, unspecified    Actinic keratosis    Breast screening, unspecified    Diabetes ([place])    Hypertension    Mammographic microcalcification    Squamous cell carcinoma of skin 06/14/2019   Left superior forehead. Well  differentiated. Tx: EDC   Squamous cell carcinoma of skin 09/13/2019   L of midline med forehead at hairline SCCIS    Squamous cell carcinoma of skin 04/23/2021   Left cheek, recurrent on 06/23/21 tx with [facility] again   Vaginal pessary present     PAST SURGICAL HISTORY: Past Surgical History:  Procedure Laterality Date   BREAST BIOPSY Left 2013   stereo calcs   BREAST EXCISIONAL BIOPSY Left    "years ago"-benign   ESOPHAGOGASTRODUODENOSCOPY Left 06/12/2018   Procedure: ESOPHAGOGASTRODUODENOSCOPY (EGD);  Surgeon: Virgel Manifold, MD;  Location: [facility] ENDOSCOPY;  Service: Endoscopy;  Laterality: Left;   ESOPHAGOGASTRODUODENOSCOPY N/A 08/31/2019   Procedure: ESOPHAGOGASTRODUODENOSCOPY (EGD);  Surgeon: Toledo, Benay Pike, MD;  Location: [facility] ENDOSCOPY;  Service: Gastroenterology;  Laterality: N/A;   SKIN LESION EXCISION      FAMILY HISTORY: Family History  Problem Relation Age of Onset   Cancer Mother    Breast cancer Neg Hx     ADVANCED DIRECTIVES (Y/N):  N  HEALTH MAINTENANCE: Social History   Tobacco Use   Smoking status: Never   Smokeless tobacco: Never  Vaping Use   Vaping Use: Never used  Substance Use Topics   Alcohol use: No    Alcohol/week: 0.0 standard drinks of alcohol   Drug use: No     Colonoscopy:  PAP:  Bone density:  Lipid panel:  Allergies  Allergen Reactions   Oxycodone Hcl Other (See Comments)    Other Reaction: GI Upset Other reaction(s): Other (See Comments) Other Reaction: GI Upset    Oxycodone-Aspirin Other (See Comments)    Other Reaction: GI Upset (Per pt she does take aspirin 81  mg daily   Augmentin [Amoxicillin-Pot Clavulanate] Nausea Only   Codeine Nausea Only   Etodolac Other (See Comments)   Iodine Nausea Only   Levaquin [Levofloxacin In D5w] Nausea Only   Levofloxacin Nausea Only   Metronidazole Nausea Only   Ultram [Tramadol] Nausea Only   Sulfa Antibiotics Nausea Only and Other (See Comments)    Other reaction(s): Unknown      Current Outpatient Medications  Medication Sig Dispense Refill   amoxicillin-clavulanate (AUGMENTIN) 875-125 MG tablet Take 1 tablet by mouth 2 (two) times daily. For 10 days. 20 tablet 0   aspirin EC 81 MG EC tablet Take 1 tablet (81 mg total) by mouth daily. Swallow whole. 30 tablet 0   clopidogrel (PLAVIX) 75 MG tablet Take 1 tablet (75 mg total) by mouth daily. 30 tablet 0   diltiazem (CARDIZEM CD) 120 MG 24 hr capsule Take 1 capsule (120 mg total) by mouth daily. 30 capsule 0   donepezil (ARICEPT) 5 MG tablet Take 1 tablet by mouth every evening.     ferrous sulfate 325 (65 FE) MG EC tablet Take 1 tablet (325 mg total) by mouth 2 (two) times daily. 60 tablet 3   glipiZIDE (GLUCOTROL XL) 5 MG 24 hr tablet Take 5 mg by mouth 2 (two) times daily.      irbesartan-hydrochlorothiazide (AVALIDE) 300-12.5 MG tablet Take 1 tablet by mouth daily.     isosorbide mononitrate (IMDUR) 30 MG 24 hr tablet Take 1 tablet (30 mg total) by mouth daily. 30 tablet 0   LEVEMIR FLEXPEN 100 UNIT/ML FlexPen Inject into the skin.     levothyroxine (SYNTHROID, LEVOTHROID) 50 MCG tablet Take 50 mcg by mouth daily.      Magnesium Oxide (MAG-OXIDE) 200 MG TABS Take 2 tablets (400 mg total) by mouth 2 (two) times daily. 10 tablet 0   metoprolol tartrate (LOPRESSOR) 50 MG tablet Take 1 tablet (50 mg total) by mouth 2 (two) times daily. 60 tablet 0   nitroGLYCERIN (NITROSTAT) 0.4 MG SL tablet Place 1 tablet (0.4 mg total) under the tongue every 5 (five) minutes x 3 doses as needed for chest pain (max 3). 30 tablet 0   pioglitazone (ACTOS) 30 MG tablet Take 30 mg by mouth daily.     TRADJENTA 5 MG TABS tablet Take 5 mg by mouth daily.      Vitamin D, Ergocalciferol, (DRISDOL) 1.25 MG (50000 UT) CAPS capsule Take 50,000 Units by mouth every Saturday.      pantoprazole (PROTONIX) 40 MG tablet Take 1 tablet (40 mg total) by mouth daily. 30 tablet 1   No current facility-administered medications for this visit.     OBJECTIVE: Vitals:   05/26/22 1114  BP: (!) 149/72  Pulse: 62  Resp: 18  Temp: 98.2 F (36.8 C)  SpO2: 99%     Body mass index is 23.65 kg/m.    ECOG FS:0 - Asymptomatic  General: Well-developed, well-nourished, no acute distress. Eyes: Pink conjunctiva, anicteric sclera. HEENT: Normocephalic, moist mucous membranes.  Left parotid mass larger with erythema. Lungs: No audible wheezing or coughing. Heart: Regular rate and rhythm. Abdomen: Soft, nontender, no obvious distention. Musculoskeletal: No edema, cyanosis, or clubbing. Neuro: Alert, answering all questions appropriately. Cranial nerves grossly intact. Skin: No rashes or petechiae noted. Psych: Normal affect.  LAB RESULTS:  Lab Results  Component Value Date   NA 142 05/26/2022   K 3.9 05/26/2022   CL 105 05/26/2022   CO2 27 05/26/2022   GLUCOSE 131 (  H) 05/26/2022   BUN 31 (H) 05/26/2022   CREATININE 1.70 (H) 05/26/2022   CALCIUM 8.9 05/26/2022   PROT 6.8 05/26/2022   ALBUMIN 3.6 05/26/2022   AST 17 05/26/2022   ALT 9 05/26/2022   ALKPHOS 70 05/26/2022   BILITOT 1.0 05/26/2022   GFRNONAA 27 (L) 05/26/2022   GFRAA 32 (L) 11/07/2019    Lab Results  Component Value Date   WBC 8.7 05/26/2022   NEUTROABS 6.2 05/26/2022   HGB 12.7 05/26/2022   HCT 37.7 05/26/2022   MCV 96.4 05/26/2022   PLT 239 05/26/2022     STUDIES: No results found.  ASSESSMENT: Stage II carcinoma left parotid gland  PLAN:    Stage II carcinoma left parotid gland: Biopsy results reviewed independently.  PET scan completed on March 25, 2022 reviewed independently and reported as above 2.7 cm irregular hypermetabolic mass in the left parotid gland.  Given stage of disease, patient will benefit from treatment with XRT and she has an appointment with radiation oncology today.  Patient did not receive concurrent chemotherapy with cisplatin.  Proceed with XRT only.  Mass appears to be worse and radiation oncology has ordered CT scan  for further evaluation.  In addition, I have given patient a 10-day course of Augmentin and patient will return to clinic in 2 weeks for further evaluation.  Plan to repeat PET scan 8 to 10 weeks after completion of XRT.  Renal insufficiency: Patient's most recent creatinine is 1.7.  No cisplatin as above.  I spent a total of 30 minutes reviewing chart data, face-to-face evaluation with the patient, counseling and coordination of care as detailed above.   Patient expressed understanding and was in agreement with this plan. She also understands that She can call clinic at any time with any questions, concerns, or complaints.    Cancer Staging  Carcinoma of parotid gland [facility]) Staging form: Major Salivary Glands, AJCC 8th Edition - Clinical stage from 04/17/2022: Stage II (cT2, cN0, cM0) - Signed by Lloyd Huger, MD on 04/17/2022 Stage prefix: Initial diagnosis   Lloyd Huger, MD   05/26/2022 4:11 PM

## 2022-05-27 ENCOUNTER — Ambulatory Visit
Admission: RE | Admit: 2022-05-27 | Discharge: 2022-05-27 | Disposition: A | Discharge status: Home / Self Care | Payer: Medicare HMO | Source: Ambulatory Visit | Attending: Radiation Oncology | Admitting: Radiation Oncology

## 2022-05-27 ENCOUNTER — Other Ambulatory Visit: Payer: Self-pay

## 2022-05-27 ENCOUNTER — Inpatient Hospital Stay: Payer: Medicare HMO

## 2022-05-27 DIAGNOSIS — Z510 Encounter for antineoplastic radiation therapy: Secondary | ICD-10-CM | POA: Diagnosis not present

## 2022-05-27 LAB — RAD ONC ARIA SESSION SUMMARY
Course Elapsed Days: 15
Plan Fractions Treated to Date: 12
Plan Prescribed Dose Per Fraction: 2 Gy
Plan Total Fractions Prescribed: 35
Plan Total Prescribed Dose: 70 Gy
Reference Point Dosage Given to Date: 24 Gy
Reference Point Session Dosage Given: 2 Gy
Session Number: 12

## 2022-05-28 ENCOUNTER — Other Ambulatory Visit: Payer: Self-pay

## 2022-05-28 ENCOUNTER — Ambulatory Visit
Admission: RE | Admit: 2022-05-28 | Discharge: 2022-05-28 | Disposition: A | Discharge status: Home / Self Care | Payer: Medicare HMO | Source: Ambulatory Visit | Attending: Radiation Oncology | Admitting: Radiation Oncology

## 2022-05-28 DIAGNOSIS — Z510 Encounter for antineoplastic radiation therapy: Secondary | ICD-10-CM | POA: Diagnosis not present

## 2022-05-28 LAB — RAD ONC ARIA SESSION SUMMARY
Course Elapsed Days: 16
Plan Fractions Treated to Date: 13
Plan Prescribed Dose Per Fraction: 2 Gy
Plan Total Fractions Prescribed: 35
Plan Total Prescribed Dose: 70 Gy
Reference Point Dosage Given to Date: 26 Gy
Reference Point Session Dosage Given: 2 Gy
Session Number: 13

## 2022-05-29 ENCOUNTER — Ambulatory Visit: Payer: Medicare HMO

## 2022-05-29 ENCOUNTER — Encounter: Payer: Self-pay | Admitting: Nurse Practitioner

## 2022-05-29 ENCOUNTER — Inpatient Hospital Stay: Payer: Medicare HMO

## 2022-05-29 ENCOUNTER — Other Ambulatory Visit: Payer: Self-pay | Admitting: *Deleted

## 2022-05-29 ENCOUNTER — Ambulatory Visit
Admission: RE | Admit: 2022-05-29 | Discharge: 2022-05-29 | Disposition: A | Discharge status: Home / Self Care | Payer: Medicare HMO | Source: Ambulatory Visit | Attending: Nurse Practitioner | Admitting: Nurse Practitioner

## 2022-05-29 ENCOUNTER — Ambulatory Visit: Admission: RE | Admit: 2022-05-29 | Payer: Medicare HMO | Source: Ambulatory Visit

## 2022-05-29 ENCOUNTER — Inpatient Hospital Stay (HOSPITAL_BASED_OUTPATIENT_CLINIC_OR_DEPARTMENT_OTHER): Payer: Medicare HMO | Admitting: Nurse Practitioner

## 2022-05-29 VITALS — BP 148/64 | HR 73 | Temp 98.9°F

## 2022-05-29 DIAGNOSIS — C07 Malignant neoplasm of parotid gland: Secondary | ICD-10-CM | POA: Insufficient documentation

## 2022-05-29 DIAGNOSIS — R220 Localized swelling, mass and lump, head: Secondary | ICD-10-CM | POA: Insufficient documentation

## 2022-05-29 DIAGNOSIS — L989 Disorder of the skin and subcutaneous tissue, unspecified: Secondary | ICD-10-CM

## 2022-05-29 DIAGNOSIS — R6889 Other general symptoms and signs: Secondary | ICD-10-CM

## 2022-05-29 LAB — COMPREHENSIVE METABOLIC PANEL
ALT: 9 U/L (ref 0–44)
AST: 16 U/L (ref 15–41)
Albumin: 3.7 g/dL (ref 3.5–5.0)
Alkaline Phosphatase: 73 U/L (ref 38–126)
Anion gap: 12 (ref 5–15)
BUN: 33 mg/dL — ABNORMAL HIGH (ref 8–23)
CO2: 24 mmol/L (ref 22–32)
Calcium: 8.8 mg/dL — ABNORMAL LOW (ref 8.9–10.3)
Chloride: 103 mmol/L (ref 98–111)
Creatinine, Ser: 1.55 mg/dL — ABNORMAL HIGH (ref 0.44–1.00)
GFR, Estimated: 31 mL/min — ABNORMAL LOW (ref >60)
Glucose, Bld: 151 mg/dL — ABNORMAL HIGH (ref 70–99)
Potassium: 3.9 mmol/L (ref 3.5–5.1)
Sodium: 139 mmol/L (ref 135–145)
Total Bilirubin: 1 mg/dL (ref 0.3–1.2)
Total Protein: 7 g/dL (ref 6.5–8.1)

## 2022-05-29 LAB — CBC WITH DIFFERENTIAL/PLATELET
Abs Immature Granulocytes: 0.05 10*3/uL (ref 0.00–0.07)
Basophils Absolute: 0.1 10*3/uL (ref 0.0–0.1)
Basophils Relative: 1 %
Eosinophils Absolute: 0.1 10*3/uL (ref 0.0–0.5)
Eosinophils Relative: 1 %
HCT: 38.2 % (ref 36.0–46.0)
Hemoglobin: 12.9 g/dL (ref 12.0–15.0)
Immature Granulocytes: 1 %
Lymphocytes Relative: 11 %
Lymphs Abs: 1.1 10*3/uL (ref 0.7–4.0)
MCH: 32.3 pg (ref 26.0–34.0)
MCHC: 33.8 g/dL (ref 30.0–36.0)
MCV: 95.7 fL (ref 80.0–100.0)
Monocytes Absolute: 0.8 10*3/uL (ref 0.1–1.0)
Monocytes Relative: 9 %
Neutro Abs: 7.7 10*3/uL (ref 1.7–7.7)
Neutrophils Relative %: 77 %
Platelets: 245 10*3/uL (ref 150–400)
RBC: 3.99 MIL/uL (ref 3.87–5.11)
RDW: 14.2 % (ref 11.5–15.5)
WBC: 9.8 10*3/uL (ref 4.0–10.5)
nRBC: 0 % (ref 0.0–0.2)

## 2022-05-29 MED ORDER — SODIUM CHLORIDE 0.9 % IV SOLN
INTRAVENOUS | Status: DC
  Filled 2022-05-29: qty 250

## 2022-05-29 MED ORDER — SODIUM CHLORIDE 0.9 % IV SOLN: 2.0000 mg | Freq: Once | INTRAVENOUS | Status: DC

## 2022-05-29 MED ORDER — DEXAMETHASONE SODIUM PHOSPHATE 10 MG/ML IJ SOLN
2.0000 mg | Freq: Once | INTRAMUSCULAR | Status: AC
  Administered 2022-05-29: 2 mg via INTRAVENOUS
  Filled 2022-05-29: qty 1

## 2022-05-29 MED ORDER — LORATADINE 10 MG PO TABS
10.0000 mg | ORAL_TABLET | Freq: Every day | ORAL | Status: DC
  Administered 2022-05-29: 10 mg via ORAL
  Filled 2022-05-29: qty 1

## 2022-05-29 MED ORDER — FAMOTIDINE IN NACL 20-0.9 MG/50ML-% IV SOLN
20.0000 mg | Freq: Once | INTRAVENOUS | Status: AC
  Administered 2022-05-29: 20 mg via INTRAVENOUS
  Filled 2022-05-29: qty 50

## 2022-05-29 NOTE — Progress Notes (Signed)
[facility] [place] [facility] [place]. 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 She was started on augmentin for possible abscess of left cheek/skin. Drainage of mass has improved but she developed upper lip swelling this morning and now reports shaking all over uncontrollably. She received her radiation treatment and was brought to [facility] for evaluation. No cough, shortness of breath. Unsure if face is swollen. Swallowing well. Denies other complaints. No new foods, makeup, or other topicals.   Review of systems- Review of Systems  Constitutional:  Positive for malaise/fatigue. Negative for chills, fever and weight loss.  HENT:  Negative for hearing loss, nosebleeds, sore throat and tinnitus.   Eyes:  Negative for blurred vision and double vision.  Respiratory:  Negative for cough, hemoptysis, shortness of breath and wheezing.   Cardiovascular:  Negative for chest pain,  palpitations and leg swelling.  Gastrointestinal:  Negative for abdominal pain, blood in stool, constipation, diarrhea, melena, nausea and vomiting.  Genitourinary:  Negative for dysuria and urgency.  Musculoskeletal:  Negative for back pain, falls, joint pain and myalgias.  Skin:  Negative for itching and rash.       Per hpi  Neurological:  Positive for tremors. Negative for dizziness, tingling, sensory change, loss of consciousness, weakness and headaches.  Endo/Heme/Allergies:  Negative for environmental allergies. Does not bruise/bleed easily.  Psychiatric/Behavioral:  Negative for depression. The patient is not nervous/anxious and does not have insomnia.       Allergies  Allergen Reactions   Oxycodone Hcl Other (See Comments)    Other Reaction: GI Upset Other reaction(s): Other (See Comments) Other Reaction: GI Upset    Oxycodone-Aspirin Other (See Comments)    Other Reaction: GI Upset (Per pt she does take aspirin 81 mg daily   Augmentin [Amoxicillin-Pot Clavulanate] Nausea Only   Codeine Nausea Only   Etodolac Other (See Comments)   Iodine Nausea Only   Levaquin [Levofloxacin In D5w] Nausea Only   Levofloxacin Nausea Only   Metronidazole Nausea Only   Ultram [Tramadol] Nausea Only   Sulfa Antibiotics Nausea Only and Other (See Comments)    Other reaction(s): Unknown     Past Medical History:  Diagnosis Date   Abnormal mammogram, unspecified    Actinic keratosis    Breast screening, unspecified    Diabetes ([place])    Hypertension    Mammographic microcalcification    Squamous cell carcinoma of skin 06/14/2019   Left superior forehead. Well differentiated. Tx: EDC   Squamous cell carcinoma of skin 09/13/2019  L of midline med forehead at hairline SCCIS    Squamous cell carcinoma of skin 04/23/2021   Left cheek, recurrent on 06/23/21 tx with [facility] again   Vaginal pessary present     Past Surgical History:  Procedure Laterality Date   BREAST BIOPSY Left 2013    stereo calcs   BREAST EXCISIONAL BIOPSY Left    "years ago"-benign   ESOPHAGOGASTRODUODENOSCOPY Left 06/12/2018   Procedure: ESOPHAGOGASTRODUODENOSCOPY (EGD);  Surgeon: Virgel Manifold, MD;  Location: [facility] ENDOSCOPY;  Service: Endoscopy;  Laterality: Left;   ESOPHAGOGASTRODUODENOSCOPY N/A 08/31/2019   Procedure: ESOPHAGOGASTRODUODENOSCOPY (EGD);  Surgeon: Toledo, Benay Pike, MD;  Location: [facility] ENDOSCOPY;  Service: Gastroenterology;  Laterality: N/A;   SKIN LESION EXCISION      Social History   Socioeconomic History   Marital status: Widowed    Spouse name: Not on file   Number of children: Not on file   Years of education: Not on file   Highest education level: Not on file  Occupational History   Not on file  Tobacco Use   Smoking status: Never   Smokeless tobacco: Never  Vaping Use   Vaping Use: Never used  Substance and Sexual Activity   Alcohol use: No    Alcohol/week: 0.0 standard drinks of alcohol   Drug use: No   Sexual activity: Not Currently  Other Topics Concern   Not on file  Social History Narrative   Not on file   Social Determinants of Health   Financial Resource Strain: Not on file  Food Insecurity: Not on file  Transportation Needs: Not on file  Physical Activity: Not on file  Stress: Not on file  Social Connections: Not on file  Intimate Partner Violence: Not on file    Family History  Problem Relation Age of Onset   Cancer Mother    Breast cancer Neg Hx      Current Outpatient Medications:    amoxicillin-clavulanate (AUGMENTIN) 875-125 MG tablet, Take 1 tablet by mouth 2 (two) times daily. For 10 days., Disp: 20 tablet, Rfl: 0   aspirin EC 81 MG EC tablet, Take 1 tablet (81 mg total) by mouth daily. Swallow whole., Disp: 30 tablet, Rfl: 0   clopidogrel (PLAVIX) 75 MG tablet, Take 1 tablet (75 mg total) by mouth daily., Disp: 30 tablet, Rfl: 0   diltiazem (CARDIZEM CD) 120 MG 24 hr capsule, Take 1 capsule (120 mg total) by mouth daily., Disp:  30 capsule, Rfl: 0   donepezil (ARICEPT) 5 MG tablet, Take 1 tablet by mouth every evening., Disp: , Rfl:    glipiZIDE (GLUCOTROL XL) 5 MG 24 hr tablet, Take 5 mg by mouth 2 (two) times daily. , Disp: , Rfl:    irbesartan-hydrochlorothiazide (AVALIDE) 300-12.5 MG tablet, Take 1 tablet by mouth daily., Disp: , Rfl:    isosorbide mononitrate (IMDUR) 30 MG 24 hr tablet, Take 1 tablet (30 mg total) by mouth daily., Disp: 30 tablet, Rfl: 0   LEVEMIR FLEXPEN 100 UNIT/ML FlexPen, Inject into the skin., Disp: , Rfl:    levothyroxine (SYNTHROID, LEVOTHROID) 50 MCG tablet, Take 50 mcg by mouth daily. , Disp: , Rfl:    Magnesium Oxide (MAG-OXIDE) 200 MG TABS, Take 2 tablets (400 mg total) by mouth 2 (two) times daily., Disp: 10 tablet, Rfl: 0   metoprolol tartrate (LOPRESSOR) 50 MG tablet, Take 1 tablet (50 mg total) by mouth 2 (two) times daily., Disp: 60 tablet, Rfl: 0   nitroGLYCERIN (NITROSTAT) 0.4 MG SL tablet, Place  1 tablet (0.4 mg total) under the tongue every 5 (five) minutes x 3 doses as needed for chest pain (max 3)., Disp: 30 tablet, Rfl: 0   pioglitazone (ACTOS) 30 MG tablet, Take 30 mg by mouth daily., Disp: , Rfl:    TRADJENTA 5 MG TABS tablet, Take 5 mg by mouth daily. , Disp: , Rfl:    Vitamin D, Ergocalciferol, (DRISDOL) 1.25 MG (50000 UT) CAPS capsule, Take 50,000 Units by mouth every Saturday. , Disp: , Rfl:    ferrous sulfate 325 (65 FE) MG EC tablet, Take 1 tablet (325 mg total) by mouth 2 (two) times daily., Disp: 60 tablet, Rfl: 3   pantoprazole (PROTONIX) 40 MG tablet, Take 1 tablet (40 mg total) by mouth daily., Disp: 30 tablet, Rfl: 1  Physical exam:  Vitals:   05/29/22 1012  BP: (!) 148/64  Pulse: 73  Temp: 98.9 F (37.2 C)  TempSrc: Tympanic  SpO2: 97%   Physical Exam Constitutional:      General: She is not in acute distress.    Appearance: She is ill-appearing. She is not toxic-appearing or diaphoretic.  Cardiovascular:     Rate and Rhythm: Normal rate and regular  rhythm.  Pulmonary:     Comments: tachypneic Skin:    Findings: Lesion present.  Neurological:     Mental Status: She is alert and oriented to person, place, and time.     Comments: rigors  Psychiatric:        Mood and Affect: Mood is anxious.        Latest Ref Rng & Units 05/29/2022    9:55 AM  CMP  Glucose 70 - 99 mg/dL 151   BUN 8 - 23 mg/dL 33   Creatinine 0.44 - 1.00 mg/dL 1.55   Sodium 135 - 145 mmol/L 139   Potassium 3.5 - 5.1 mmol/L 3.9   Chloride 98 - 111 mmol/L 103   CO2 22 - 32 mmol/L 24   Calcium 8.9 - 10.3 mg/dL 8.8   Total Protein 6.5 - 8.1 g/dL 7.0   Total Bilirubin 0.3 - 1.2 mg/dL 1.0   Alkaline Phos 38 - 126 U/L 73   AST 15 - 41 U/L 16   ALT 0 - 44 U/L 9       Latest Ref Rng & Units 05/29/2022    9:55 AM  CBC  WBC 4.0 - 10.5 K/uL 9.8   Hemoglobin 12.0 - 15.0 g/dL 12.9   Hematocrit 36.0 - 46.0 % 38.2   Platelets 150 - 400 K/uL 245     No results found.  Assessment and plan- Patient is a 88 y.o. female who presents to [facility] for   Lip edema- allergic reaction vs mass effect vs radiation side effect vs others. Will get ct to evaluate further.  Allergic reaction- suspect allergic reaction to augmentin. Protecting airway. Will discontinue augmentin, added to allergy list. Discussed with Dr Darrall Dears. Patient will receive decadron 2 mg IV, claritin 10 mg oral, and pepcid in clinic today.  Stage II carcinoma left parotid gland - felt not to be a candidate for chemotherapy. Currently receiving radiation. CT shows enlargement but unclear if radiation effect. Will send to Dr Baruch Gouty and Dr. Grayland Ormond for review and recommendations.  Anterior left skin lesion- previously hypermetabolic on pet. Has enlarged. CT imaging obtained to evaluate infectious vs other etiologies. Suspicious for squamous cell carcinoma. Discussed imaging with radiology who feels biopsy is warranted. Suspicion is that parotid tumor may be metastatic  from skin lesion though  unclear. Will discuss with Dr. Grayland Ormond and if warranted, refer to dermatology for biopsy. Unclear if this area is included in current treatment field and reviewed that he may want to discuss with Dr. Baruch Gouty as well. If so, hesitant to biopsy lesion given her active treatment. Dr Grayland Ormond and Dr Baruch Gouty updated.   Disposition: Rtc if symptoms do not improve or worsen.  Follow up with Dr. Grayland Ormond next week for results- la   Visit Diagnosis 1. Swelling of upper lip   2. 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 Facial lesion    Patient expressed understanding and was in agreement with this plan. She also understands that She can call clinic at any time with any questions, concerns, or complaints.   Thank you for allowing me to participate in the care of this very pleasant patient.   Beckey Rutter, DNP, AGNP-C [place] [place] [facility] [place]

## 2022-06-02 ENCOUNTER — Ambulatory Visit: Payer: Medicare HMO

## 2022-06-02 ENCOUNTER — Other Ambulatory Visit: Payer: Self-pay | Admitting: *Deleted

## 2022-06-02 ENCOUNTER — Ambulatory Visit: Admission: RE | Admit: 2022-06-02 | Payer: Medicare HMO | Source: Ambulatory Visit

## 2022-06-02 DIAGNOSIS — C07 Malignant neoplasm of parotid gland: Secondary | ICD-10-CM

## 2022-06-02 MED ORDER — DEXAMETHASONE 2 MG PO TABS: 4.0000 mg | ORAL_TABLET | Freq: Two times a day (BID) | ORAL | 0 refills | Status: DC

## 2022-06-03 ENCOUNTER — Ambulatory Visit: Payer: Medicare HMO

## 2022-06-03 ENCOUNTER — Inpatient Hospital Stay: Payer: Medicare HMO

## 2022-06-03 DIAGNOSIS — C07 Malignant neoplasm of parotid gland: Secondary | ICD-10-CM | POA: Diagnosis not present

## 2022-06-03 LAB — CBC
HCT: 36.2 % (ref 36.0–46.0)
Hemoglobin: 12.4 g/dL (ref 12.0–15.0)
MCH: 32.5 pg (ref 26.0–34.0)
MCHC: 34.3 g/dL (ref 30.0–36.0)
MCV: 95 fL (ref 80.0–100.0)
Platelets: 282 10*3/uL (ref 150–400)
RBC: 3.81 MIL/uL — ABNORMAL LOW (ref 3.87–5.11)
RDW: 13.9 % (ref 11.5–15.5)
WBC: 8.5 10*3/uL (ref 4.0–10.5)
nRBC: 0 % (ref 0.0–0.2)

## 2022-06-04 ENCOUNTER — Ambulatory Visit: Payer: Medicare HMO

## 2022-06-05 ENCOUNTER — Ambulatory Visit: Payer: Medicare HMO

## 2022-06-05 ENCOUNTER — Other Ambulatory Visit: Payer: Self-pay

## 2022-06-05 DIAGNOSIS — C07 Malignant neoplasm of parotid gland: Secondary | ICD-10-CM

## 2022-06-09 ENCOUNTER — Other Ambulatory Visit: Payer: Self-pay

## 2022-06-09 ENCOUNTER — Ambulatory Visit
Admission: RE | Admit: 2022-06-09 | Discharge: 2022-06-09 | Disposition: A | Discharge status: Home / Self Care | Payer: Medicare HMO | Source: Ambulatory Visit | Attending: Radiation Oncology | Admitting: Radiation Oncology

## 2022-06-09 ENCOUNTER — Inpatient Hospital Stay: Payer: Medicare HMO | Attending: Oncology | Admitting: Oncology

## 2022-06-09 ENCOUNTER — Encounter: Payer: Self-pay | Admitting: Oncology

## 2022-06-09 VITALS — BP 140/60 | HR 52 | Resp 18

## 2022-06-09 DIAGNOSIS — C07 Malignant neoplasm of parotid gland: Secondary | ICD-10-CM | POA: Insufficient documentation

## 2022-06-09 DIAGNOSIS — Z510 Encounter for antineoplastic radiation therapy: Secondary | ICD-10-CM | POA: Insufficient documentation

## 2022-06-09 DIAGNOSIS — Z7902 Long term (current) use of antithrombotics/antiplatelets: Secondary | ICD-10-CM | POA: Diagnosis not present

## 2022-06-09 DIAGNOSIS — Z7982 Long term (current) use of aspirin: Secondary | ICD-10-CM | POA: Diagnosis not present

## 2022-06-09 DIAGNOSIS — Z7984 Long term (current) use of oral hypoglycemic drugs: Secondary | ICD-10-CM | POA: Diagnosis not present

## 2022-06-09 DIAGNOSIS — Z79899 Other long term (current) drug therapy: Secondary | ICD-10-CM | POA: Diagnosis not present

## 2022-06-09 LAB — RAD ONC ARIA SESSION SUMMARY
Course Elapsed Days: 28
Plan Fractions Treated to Date: 14
Plan Prescribed Dose Per Fraction: 2 Gy
Plan Total Fractions Prescribed: 35
Plan Total Prescribed Dose: 70 Gy
Reference Point Dosage Given to Date: 28 Gy
Reference Point Session Dosage Given: 2 Gy
Session Number: 14

## 2022-06-09 NOTE — Progress Notes (Signed)
[facility]  [phone] [phone]  ID: Erika Ochoa OB: 06/27/1924  MR#: [id number]  ERX#:[id number]  Patient Care Team: Maryland Pink, MD as PCP - General (Family Medicine)  CHIEF COMPLAINT: Stage II carcinoma left parotid gland.  INTERVAL HISTORY: Patient returns to clinic today for routine evaluation and assessment of her parotid mass.  She feels there is increased draining, but overall has improved.  She does not complain of significant pain today.  She has no neurologic complaints.  She denies any recent fevers or illnesses.  She has a good appetite and denies weight loss.  She has no chest pain, shortness of breath, cough, or hemoptysis.  She denies any nausea, vomiting, constipation, or diarrhea.  She has no urinary complaints.  Patient offers no further specific complaints today.  REVIEW OF SYSTEMS:   Review of Systems  Constitutional: Negative.  Negative for fever, malaise/fatigue and weight loss.  Respiratory: Negative.  Negative for cough, hemoptysis and shortness of breath.   Cardiovascular: Negative.  Negative for chest pain and leg swelling.  Gastrointestinal: Negative.  Negative for abdominal pain.  Genitourinary: Negative.  Negative for dysuria.  Musculoskeletal:  Positive for neck pain.  Skin: Negative.  Negative for rash.  Neurological: Negative.  Negative for dizziness, focal weakness, weakness and headaches.  Psychiatric/Behavioral: Negative.  The patient is not nervous/anxious.    As per HPI. Otherwise, a complete review of systems is negative.   PAST MEDICAL HISTORY: Past Medical History:  Diagnosis Date   Abnormal mammogram, unspecified    Actinic keratosis    Breast screening, unspecified    Diabetes ([place])    Hypertension    Mammographic microcalcification    Squamous cell carcinoma of skin 06/14/2019   Left superior forehead. Well differentiated. Tx: EDC   Squamous cell carcinoma of skin 09/13/2019   L of  midline med forehead at hairline SCCIS    Squamous cell carcinoma of skin 04/23/2021   Left cheek, recurrent on 06/23/21 tx with [facility] again   Vaginal pessary present     PAST SURGICAL HISTORY: Past Surgical History:  Procedure Laterality Date   BREAST BIOPSY Left 2013   stereo calcs   BREAST EXCISIONAL BIOPSY Left    "years ago"-benign   ESOPHAGOGASTRODUODENOSCOPY Left 06/12/2018   Procedure: ESOPHAGOGASTRODUODENOSCOPY (EGD);  Surgeon: Virgel Manifold, MD;  Location: [facility] ENDOSCOPY;  Service: Endoscopy;  Laterality: Left;   ESOPHAGOGASTRODUODENOSCOPY N/A 08/31/2019   Procedure: ESOPHAGOGASTRODUODENOSCOPY (EGD);  Surgeon: Toledo, Benay Pike, MD;  Location: [facility] ENDOSCOPY;  Service: Gastroenterology;  Laterality: N/A;   SKIN LESION EXCISION      FAMILY HISTORY: Family History  Problem Relation Age of Onset   Cancer Mother    Breast cancer Neg Hx     ADVANCED DIRECTIVES (Y/N):  N  HEALTH MAINTENANCE: Social History   Tobacco Use   Smoking status: Never   Smokeless tobacco: Never  Vaping Use   Vaping Use: Never used  Substance Use Topics   Alcohol use: No    Alcohol/week: 0.0 standard drinks of alcohol   Drug use: No     Colonoscopy:  PAP:  Bone density:  Lipid panel:  Allergies  Allergen Reactions   Augmentin [Amoxicillin-Pot Clavulanate] Swelling    Facial and lip swelling 05/29/22   Oxycodone Hcl Other (See Comments)    Other Reaction: GI Upset Other reaction(s): Other (See Comments) Other Reaction: GI Upset    Oxycodone-Aspirin Other (See Comments)    Other Reaction: GI Upset (Per pt she  does take aspirin 81 mg daily   Codeine Nausea Only   Etodolac Other (See Comments)   Iodine Nausea Only   Levaquin [Levofloxacin In D5w] Nausea Only   Levofloxacin Nausea Only   Metronidazole Nausea Only   Ultram [Tramadol] Nausea Only   Sulfa Antibiotics Nausea Only and Other (See Comments)    Other reaction(s): Unknown     Current Outpatient Medications   Medication Sig Dispense Refill   predniSONE (STERAPRED UNI-PAK 21 TAB) 10 MG (21) TBPK tablet Take by mouth.     aspirin EC 81 MG EC tablet Take 1 tablet (81 mg total) by mouth daily. Swallow whole. 30 tablet 0   clopidogrel (PLAVIX) 75 MG tablet Take 1 tablet (75 mg total) by mouth daily. 30 tablet 0   dexamethasone (DECADRON) 2 MG tablet Take 2 tablets (4 mg total) by mouth 2 (two) times daily with a meal. 60 tablet 0   diltiazem (CARDIZEM CD) 120 MG 24 hr capsule Take 1 capsule (120 mg total) by mouth daily. 30 capsule 0   donepezil (ARICEPT) 5 MG tablet Take 1 tablet by mouth every evening.     ferrous sulfate 325 (65 FE) MG EC tablet Take 1 tablet (325 mg total) by mouth 2 (two) times daily. 60 tablet 3   glipiZIDE (GLUCOTROL XL) 5 MG 24 hr tablet Take 5 mg by mouth 2 (two) times daily.      irbesartan-hydrochlorothiazide (AVALIDE) 300-12.5 MG tablet Take 1 tablet by mouth daily.     isosorbide mononitrate (IMDUR) 30 MG 24 hr tablet Take 1 tablet (30 mg total) by mouth daily. 30 tablet 0   LEVEMIR FLEXPEN 100 UNIT/ML FlexPen Inject into the skin.     levothyroxine (SYNTHROID, LEVOTHROID) 50 MCG tablet Take 50 mcg by mouth daily.      Magnesium Oxide (MAG-OXIDE) 200 MG TABS Take 2 tablets (400 mg total) by mouth 2 (two) times daily. 10 tablet 0   metoprolol tartrate (LOPRESSOR) 50 MG tablet Take 1 tablet (50 mg total) by mouth 2 (two) times daily. 60 tablet 0   nitroGLYCERIN (NITROSTAT) 0.4 MG SL tablet Place 1 tablet (0.4 mg total) under the tongue every 5 (five) minutes x 3 doses as needed for chest pain (max 3). 30 tablet 0   pantoprazole (PROTONIX) 40 MG tablet Take 1 tablet (40 mg total) by mouth daily. 30 tablet 1   pioglitazone (ACTOS) 30 MG tablet Take 30 mg by mouth daily.     TRADJENTA 5 MG TABS tablet Take 5 mg by mouth daily.      Vitamin D, Ergocalciferol, (DRISDOL) 1.25 MG (50000 UT) CAPS capsule Take 50,000 Units by mouth every Saturday.      No current facility-administered  medications for this visit.    OBJECTIVE: Vitals:   06/09/22 0959  BP: (!) 140/60  Pulse: (!) 52  Resp: 18     There is no height or weight on file to calculate BMI.    ECOG FS:0 - Asymptomatic  General: Well-developed, well-nourished, no acute distress. Eyes: Pink conjunctiva, anicteric sclera. HEENT: Normocephalic, moist mucous membranes.  Left parotid mass unchanged.  No palpable lymphadenopathy. Lungs: No audible wheezing or coughing. Heart: Regular rate and rhythm. Abdomen: Soft, nontender, no obvious distention. Musculoskeletal: No edema, cyanosis, or clubbing. Neuro: Alert, answering all questions appropriately. Cranial nerves grossly intact. Skin: No rashes or petechiae noted. Psych: Normal affect.   LAB RESULTS:  Lab Results  Component Value Date   NA 139 05/29/2022  K 3.9 05/29/2022   CL 103 05/29/2022   CO2 24 05/29/2022   GLUCOSE 151 (H) 05/29/2022   BUN 33 (H) 05/29/2022   CREATININE 1.55 (H) 05/29/2022   CALCIUM 8.8 (L) 05/29/2022   PROT 7.0 05/29/2022   ALBUMIN 3.7 05/29/2022   AST 16 05/29/2022   ALT 9 05/29/2022   ALKPHOS 73 05/29/2022   BILITOT 1.0 05/29/2022   GFRNONAA 31 (L) 05/29/2022   GFRAA 32 (L) 11/07/2019    Lab Results  Component Value Date   WBC 8.5 06/03/2022   NEUTROABS 7.7 05/29/2022   HGB 12.4 06/03/2022   HCT 36.2 06/03/2022   MCV 95.0 06/03/2022   PLT 282 06/03/2022     STUDIES: CT SOFT TISSUE NECK WO CONTRAST  Result Date: 05/29/2022 CLINICAL DATA:  Provided history: Head/neck cancer, monitor malignancy of parotid gland with new lesion, labial swelling. Question allergic reaction versus radiation versus progressive disease. EXAM: CT NECK WITHOUT CONTRAST TECHNIQUE: Multidetector CT imaging of the neck was performed following the standard protocol without intravenous contrast. RADIATION DOSE REDUCTION: This exam was performed according to the departmental dose-optimization program which includes automated exposure control,  adjustment of the mA and/or kV according to patient size and/or use of iterative reconstruction technique. COMPARISON:  PET-CT 04/16/2022. Neck CT 03/25/2022. FINDINGS: Pharynx and larynx: Streak and beam hardening artifact arising from dental restoration partially obscures the oral cavity. No appreciable swelling or mass within the oral cavity, pharynx or larynx. Incidentally noted torus palatinus. Salivary glands: The centrally necrotic mass within the left parotid gland mass increased in size from the prior PET-CT of 04/16/2022, now measuring 4.0 x 3.2 cm in transaxial dimensions (previously measuring 3.0 x 2.3 cm). The mass now appears to involve the overlying skin. As before, the mass abuts the left masseter muscle. Infiltration of the left masseter muscle cannot be excluded. Thyroid: Prior right hemithyroidectomy. Subcentimeter nodule within the left thyroid lobe, not meeting consensus criteria for ultrasound follow-up based on size. No follow-up imaging is recommended. Reference: J Am Coll Radiol. 2015 Feb;12(2): 143-50. Lymph nodes: Mass within the left parotid gland as described above. Elsewhere within the neck, no pathologically enlarged lymph nodes are identified. Vascular: The major vascular structures of the neck are incompletely assessed in the absence of intravenous contrast. Atherosclerotic plaque within the visualized aortic arch, proximal major branch vessels of the neck and within the carotid arteries. Limited intracranial: No evidence of acute intracranial abnormality within the field of view. Visualized orbits: No orbital mass or acute orbital finding. Mastoids and visualized paranasal sinuses: Extensive partial opacification of the right sphenoid sinus with associated chronic reactive osteitis. Mild mucosal thickening within the bilateral maxillary sinuses. Small-volume fluid within the bilateral mastoid air cells. Skeleton: Slight grade 1 anterolisthesis and right-sided facet joint ankylosis  at C4-C5. Cervical spondylosis. No acute fracture or aggressive osseous lesion. Upper chest: No consolidation within the imaged lung apices. Other: Irregular focus of skin thickening within the left cheek, measuring 2.6 x 0.4 cm (for instance as seen on series 3, image 112). Reviewing the prior PET-CT of 04/16/2022, there was apparent hypermetabolism at this site. Streak and beam hardening artifact arising from dental restoration limits evaluation of the lips and perioral soft tissues. Within this limitation, no perioral mass is appreciated. Impressions #1 and #2 will be called to the ordering clinician or representative by the Radiologist Assistant, and communication documented in the PACS or [employer]. IMPRESSION: 1. The centrally necrotic mass within the left parotid gland has increased in  size from the prior PET CT of 04/16/2022, now measuring 4.0 x 3.2 cm in transaxial dimensions (biopsy-proven malignancy). The mass now appears to involve the overlying skin. As before, the mass abuts the left masseter muscle and infiltration of the left masseter muscle cannot be excluded. 2. 2.6 x 0.4 cm focus of irregular skin thickening within the left cheek, anterior to the parotid mass. Reviewing the prior PET-CT of 04/16/2022, there was apparent hypermetabolism at this site, and this finding is suspicious for a skin malignancy. Direct visualization is recommended and direct tissue sampling should be considered. 3. Streak and beam hardening artifact arising from dental restoration limits evaluation of the lips and perioral soft tissues. Within this limitation, there is no appreciable perioral soft tissue mass. 4. No pathologically enlarged lymph nodes identified elsewhere within the neck. 5. Paranasal sinus disease, as described. 6. Aortic Atherosclerosis (ICD10-I70.0). Electronically Signed   By: Kellie Simmering D.O.   On: 05/29/2022 15:53    ASSESSMENT: Stage II carcinoma left parotid gland  PLAN:    Stage II  carcinoma left parotid gland: Biopsy results reviewed independently.  PET scan completed on March 25, 2022 reviewed independently and reported as above 2.7 cm irregular hypermetabolic mass in the left parotid gland.  Given stage of disease, patient will benefit from treatment with XRT and she has an appointment with radiation oncology today.  Patient did not receive concurrent chemotherapy with cisplatin.  Proceed with XRT only.  CT scan results from May 29, 2022 reviewed independently and report as above with what appears to be enlarging mass, but suspect this is inflammation and infection.  There has been improvement since receiving 10 days of Augmentin.  Continue daily XRT completing on July 08, 2022.  Return to clinic in 2 weeks for further evaluation.  Plan to repeat PET scan 8 to 10 weeks after completion of XRT.  Renal insufficiency: Patient's most recent creatinine is 1.55.  No cisplatin as above.   Patient expressed understanding and was in agreement with this plan. She also understands that She can call clinic at any time with any questions, concerns, or complaints.    Cancer Staging  Carcinoma of parotid gland [facility]) Staging form: Major Salivary Glands, AJCC 8th Edition - Clinical stage from 04/17/2022: Stage II (cT2, cN0, cM0) - Signed by Lloyd Huger, MD on 04/17/2022 Stage prefix: Initial diagnosis   Lloyd Huger, MD   06/09/2022 10:13 AM

## 2022-06-10 ENCOUNTER — Ambulatory Visit
Admission: RE | Admit: 2022-06-10 | Discharge: 2022-06-10 | Disposition: A | Discharge status: Home / Self Care | Payer: Medicare HMO | Source: Ambulatory Visit | Attending: Radiation Oncology | Admitting: Radiation Oncology

## 2022-06-10 ENCOUNTER — Inpatient Hospital Stay: Payer: Medicare HMO

## 2022-06-10 ENCOUNTER — Other Ambulatory Visit: Payer: Self-pay

## 2022-06-10 DIAGNOSIS — C07 Malignant neoplasm of parotid gland: Secondary | ICD-10-CM

## 2022-06-10 DIAGNOSIS — Z510 Encounter for antineoplastic radiation therapy: Secondary | ICD-10-CM | POA: Diagnosis not present

## 2022-06-10 LAB — CBC
HCT: 37.1 % (ref 36.0–46.0)
Hemoglobin: 13 g/dL (ref 12.0–15.0)
MCH: 32.7 pg (ref 26.0–34.0)
MCHC: 35 g/dL (ref 30.0–36.0)
MCV: 93.5 fL (ref 80.0–100.0)
Platelets: 297 10*3/uL (ref 150–400)
RBC: 3.97 MIL/uL (ref 3.87–5.11)
RDW: 13.4 % (ref 11.5–15.5)
WBC: 13.2 10*3/uL — ABNORMAL HIGH (ref 4.0–10.5)
nRBC: 0 % (ref 0.0–0.2)

## 2022-06-10 LAB — RAD ONC ARIA SESSION SUMMARY
Course Elapsed Days: 29
Plan Fractions Treated to Date: 15
Plan Prescribed Dose Per Fraction: 2 Gy
Plan Total Fractions Prescribed: 35
Plan Total Prescribed Dose: 70 Gy
Reference Point Dosage Given to Date: 30 Gy
Reference Point Session Dosage Given: 2 Gy
Session Number: 15

## 2022-06-10 MED ORDER — GADOBUTROL 1 MMOL/ML IV SOLN
6.0000 mL | Freq: Once | INTRAVENOUS | Status: AC | PRN
  Administered 2022-06-10: 6 mL via INTRAVENOUS

## 2022-06-11 ENCOUNTER — Other Ambulatory Visit: Payer: Medicare HMO

## 2022-06-11 ENCOUNTER — Other Ambulatory Visit: Payer: Self-pay

## 2022-06-11 ENCOUNTER — Ambulatory Visit
Admission: RE | Admit: 2022-06-11 | Discharge: 2022-06-11 | Disposition: A | Discharge status: Home / Self Care | Payer: Medicare HMO | Source: Ambulatory Visit | Attending: Radiation Oncology | Admitting: Radiation Oncology

## 2022-06-11 DIAGNOSIS — Z510 Encounter for antineoplastic radiation therapy: Secondary | ICD-10-CM | POA: Diagnosis not present

## 2022-06-11 LAB — RAD ONC ARIA SESSION SUMMARY
Course Elapsed Days: 30
Plan Fractions Treated to Date: 16
Plan Prescribed Dose Per Fraction: 2 Gy
Plan Total Fractions Prescribed: 35
Plan Total Prescribed Dose: 70 Gy
Reference Point Dosage Given to Date: 32 Gy
Reference Point Session Dosage Given: 2 Gy
Session Number: 16

## 2022-06-12 ENCOUNTER — Other Ambulatory Visit: Payer: Self-pay

## 2022-06-12 ENCOUNTER — Ambulatory Visit
Admission: RE | Admit: 2022-06-12 | Discharge: 2022-06-12 | Disposition: A | Discharge status: Home / Self Care | Payer: Medicare HMO | Source: Ambulatory Visit | Attending: Radiation Oncology | Admitting: Radiation Oncology

## 2022-06-12 DIAGNOSIS — Z510 Encounter for antineoplastic radiation therapy: Secondary | ICD-10-CM | POA: Diagnosis not present

## 2022-06-12 LAB — RAD ONC ARIA SESSION SUMMARY
Course Elapsed Days: 31
Plan Fractions Treated to Date: 17
Plan Prescribed Dose Per Fraction: 2 Gy
Plan Total Fractions Prescribed: 35
Plan Total Prescribed Dose: 70 Gy
Reference Point Dosage Given to Date: 34 Gy
Reference Point Session Dosage Given: 2 Gy
Session Number: 17

## 2022-06-15 ENCOUNTER — Other Ambulatory Visit: Payer: Self-pay

## 2022-06-15 ENCOUNTER — Ambulatory Visit
Admission: RE | Admit: 2022-06-15 | Discharge: 2022-06-15 | Disposition: A | Discharge status: Home / Self Care | Payer: Medicare HMO | Source: Ambulatory Visit | Attending: Radiation Oncology | Admitting: Radiation Oncology

## 2022-06-15 DIAGNOSIS — Z510 Encounter for antineoplastic radiation therapy: Secondary | ICD-10-CM | POA: Diagnosis not present

## 2022-06-15 LAB — RAD ONC ARIA SESSION SUMMARY
Course Elapsed Days: 34
Plan Fractions Treated to Date: 18
Plan Prescribed Dose Per Fraction: 2 Gy
Plan Total Fractions Prescribed: 35
Plan Total Prescribed Dose: 70 Gy
Reference Point Dosage Given to Date: 36 Gy
Reference Point Session Dosage Given: 2 Gy
Session Number: 18

## 2022-06-16 ENCOUNTER — Other Ambulatory Visit: Payer: Self-pay

## 2022-06-16 ENCOUNTER — Ambulatory Visit
Admission: RE | Admit: 2022-06-16 | Discharge: 2022-06-16 | Disposition: A | Discharge status: Home / Self Care | Payer: Medicare HMO | Source: Ambulatory Visit | Attending: Radiation Oncology | Admitting: Radiation Oncology

## 2022-06-16 DIAGNOSIS — Z510 Encounter for antineoplastic radiation therapy: Secondary | ICD-10-CM | POA: Diagnosis not present

## 2022-06-16 LAB — RAD ONC ARIA SESSION SUMMARY
Course Elapsed Days: 35
Plan Fractions Treated to Date: 19
Plan Prescribed Dose Per Fraction: 2 Gy
Plan Total Fractions Prescribed: 35
Plan Total Prescribed Dose: 70 Gy
Reference Point Dosage Given to Date: 38 Gy
Reference Point Session Dosage Given: 2 Gy
Session Number: 19

## 2022-06-17 ENCOUNTER — Inpatient Hospital Stay: Payer: Medicare HMO

## 2022-06-17 ENCOUNTER — Ambulatory Visit: Payer: Medicare HMO

## 2022-06-18 ENCOUNTER — Inpatient Hospital Stay: Payer: Medicare HMO

## 2022-06-18 ENCOUNTER — Other Ambulatory Visit: Payer: Self-pay

## 2022-06-18 ENCOUNTER — Ambulatory Visit
Admission: RE | Admit: 2022-06-18 | Discharge: 2022-06-18 | Disposition: A | Discharge status: Home / Self Care | Payer: Medicare HMO | Source: Ambulatory Visit | Attending: Radiation Oncology | Admitting: Radiation Oncology

## 2022-06-18 DIAGNOSIS — C07 Malignant neoplasm of parotid gland: Secondary | ICD-10-CM

## 2022-06-18 DIAGNOSIS — Z510 Encounter for antineoplastic radiation therapy: Secondary | ICD-10-CM | POA: Diagnosis not present

## 2022-06-18 LAB — RAD ONC ARIA SESSION SUMMARY
Course Elapsed Days: 37
Plan Fractions Treated to Date: 20
Plan Prescribed Dose Per Fraction: 2 Gy
Plan Total Fractions Prescribed: 35
Plan Total Prescribed Dose: 70 Gy
Reference Point Dosage Given to Date: 40 Gy
Reference Point Session Dosage Given: 2 Gy
Session Number: 20

## 2022-06-18 LAB — CBC
HCT: 37 % (ref 36.0–46.0)
Hemoglobin: 13 g/dL (ref 12.0–15.0)
MCH: 32.8 pg (ref 26.0–34.0)
MCHC: 35.1 g/dL (ref 30.0–36.0)
MCV: 93.4 fL (ref 80.0–100.0)
Platelets: 207 10*3/uL (ref 150–400)
RBC: 3.96 MIL/uL (ref 3.87–5.11)
RDW: 13.5 % (ref 11.5–15.5)
WBC: 13.9 10*3/uL — ABNORMAL HIGH (ref 4.0–10.5)
nRBC: 0 % (ref 0.0–0.2)

## 2022-06-19 ENCOUNTER — Other Ambulatory Visit: Payer: Self-pay

## 2022-06-19 ENCOUNTER — Ambulatory Visit
Admission: RE | Admit: 2022-06-19 | Discharge: 2022-06-19 | Disposition: A | Discharge status: Home / Self Care | Payer: Medicare HMO | Source: Ambulatory Visit | Attending: Radiation Oncology | Admitting: Radiation Oncology

## 2022-06-19 DIAGNOSIS — Z510 Encounter for antineoplastic radiation therapy: Secondary | ICD-10-CM | POA: Diagnosis not present

## 2022-06-19 LAB — RAD ONC ARIA SESSION SUMMARY
Course Elapsed Days: 38
Plan Fractions Treated to Date: 21
Plan Prescribed Dose Per Fraction: 2 Gy
Plan Total Fractions Prescribed: 35
Plan Total Prescribed Dose: 70 Gy
Reference Point Dosage Given to Date: 42 Gy
Reference Point Session Dosage Given: 2 Gy
Session Number: 21

## 2022-06-22 ENCOUNTER — Ambulatory Visit
Admission: RE | Admit: 2022-06-22 | Discharge: 2022-06-22 | Disposition: A | Discharge status: Home / Self Care | Payer: Medicare HMO | Source: Ambulatory Visit | Attending: Radiation Oncology | Admitting: Radiation Oncology

## 2022-06-22 ENCOUNTER — Other Ambulatory Visit: Payer: Self-pay

## 2022-06-22 DIAGNOSIS — Z510 Encounter for antineoplastic radiation therapy: Secondary | ICD-10-CM | POA: Diagnosis not present

## 2022-06-22 LAB — RAD ONC ARIA SESSION SUMMARY
Course Elapsed Days: 41
Plan Fractions Treated to Date: 22
Plan Prescribed Dose Per Fraction: 2 Gy
Plan Total Fractions Prescribed: 35
Plan Total Prescribed Dose: 70 Gy
Reference Point Dosage Given to Date: 44 Gy
Reference Point Session Dosage Given: 2 Gy
Session Number: 22

## 2022-06-23 ENCOUNTER — Inpatient Hospital Stay: Payer: Medicare HMO

## 2022-06-23 ENCOUNTER — Other Ambulatory Visit: Payer: Self-pay

## 2022-06-23 ENCOUNTER — Ambulatory Visit
Admission: RE | Admit: 2022-06-23 | Discharge: 2022-06-23 | Disposition: A | Discharge status: Home / Self Care | Payer: Medicare HMO | Source: Ambulatory Visit | Attending: Radiation Oncology | Admitting: Radiation Oncology

## 2022-06-23 ENCOUNTER — Inpatient Hospital Stay (HOSPITAL_BASED_OUTPATIENT_CLINIC_OR_DEPARTMENT_OTHER): Payer: Medicare HMO | Admitting: Oncology

## 2022-06-23 ENCOUNTER — Encounter: Payer: Self-pay | Admitting: Oncology

## 2022-06-23 VITALS — BP 126/57 | HR 72 | Temp 98.2°F | Resp 16 | Ht 66.0 in | Wt 144.0 lb

## 2022-06-23 DIAGNOSIS — Z510 Encounter for antineoplastic radiation therapy: Secondary | ICD-10-CM | POA: Diagnosis not present

## 2022-06-23 DIAGNOSIS — C07 Malignant neoplasm of parotid gland: Secondary | ICD-10-CM

## 2022-06-23 LAB — COMPREHENSIVE METABOLIC PANEL
ALT: 14 U/L (ref 0–44)
AST: 17 U/L (ref 15–41)
Albumin: 2.9 g/dL — ABNORMAL LOW (ref 3.5–5.0)
Alkaline Phosphatase: 66 U/L (ref 38–126)
Anion gap: 6 (ref 5–15)
BUN: 37 mg/dL — ABNORMAL HIGH (ref 8–23)
CO2: 27 mmol/L (ref 22–32)
Calcium: 8.4 mg/dL — ABNORMAL LOW (ref 8.9–10.3)
Chloride: 106 mmol/L (ref 98–111)
Creatinine, Ser: 1.49 mg/dL — ABNORMAL HIGH (ref 0.44–1.00)
GFR, Estimated: 32 mL/min — ABNORMAL LOW (ref >60)
Glucose, Bld: 90 mg/dL (ref 70–99)
Potassium: 4.1 mmol/L (ref 3.5–5.1)
Sodium: 139 mmol/L (ref 135–145)
Total Bilirubin: 1 mg/dL (ref 0.3–1.2)
Total Protein: 6 g/dL — ABNORMAL LOW (ref 6.5–8.1)

## 2022-06-23 LAB — CBC WITH DIFFERENTIAL/PLATELET
Abs Immature Granulocytes: 0.06 10*3/uL (ref 0.00–0.07)
Basophils Absolute: 0 10*3/uL (ref 0.0–0.1)
Basophils Relative: 0 %
Eosinophils Absolute: 0.1 10*3/uL (ref 0.0–0.5)
Eosinophils Relative: 1 %
HCT: 38.3 % (ref 36.0–46.0)
Hemoglobin: 12.8 g/dL (ref 12.0–15.0)
Immature Granulocytes: 1 %
Lymphocytes Relative: 13 %
Lymphs Abs: 1.1 10*3/uL (ref 0.7–4.0)
MCH: 32.5 pg (ref 26.0–34.0)
MCHC: 33.4 g/dL (ref 30.0–36.0)
MCV: 97.2 fL (ref 80.0–100.0)
Monocytes Absolute: 0.8 10*3/uL (ref 0.1–1.0)
Monocytes Relative: 9 %
Neutro Abs: 6.5 10*3/uL (ref 1.7–7.7)
Neutrophils Relative %: 76 %
Platelets: 139 10*3/uL — ABNORMAL LOW (ref 150–400)
RBC: 3.94 MIL/uL (ref 3.87–5.11)
RDW: 14 % (ref 11.5–15.5)
WBC: 8.6 10*3/uL (ref 4.0–10.5)
nRBC: 0 % (ref 0.0–0.2)

## 2022-06-23 LAB — RAD ONC ARIA SESSION SUMMARY
Course Elapsed Days: 42
Plan Fractions Treated to Date: 23
Plan Prescribed Dose Per Fraction: 2 Gy
Plan Total Fractions Prescribed: 35
Plan Total Prescribed Dose: 70 Gy
Reference Point Dosage Given to Date: 46 Gy
Reference Point Session Dosage Given: 2 Gy
Session Number: 23

## 2022-06-23 NOTE — Addendum Note (Signed)
Addended by: Sofie Rower A on: 06/23/2022 11:24 AM   Modules accepted: Orders

## 2022-06-23 NOTE — Progress Notes (Signed)
[place]  [phone] Fax:([phone]) [phone]  ID: Erika Ochoa OB: [birth year]  MR#: [id number]  QIO#:[id number]  Patient Care Team: [place] Pink, MD as PCP - General (Family Medicine) Lloyd Huger, MD as Consulting Physician (Oncology)  CHIEF COMPLAINT: Stage II carcinoma left parotid gland.  INTERVAL HISTORY: Patient returns to clinic today for routine evaluation and assessment of her parotid mass.  The size has decreased, but patient reports continued drainage but admits this has improved as well.  She does not complain of pain today.  She has no neurologic complaints.  She denies any recent fevers or illnesses.  She has a good appetite and denies weight loss.  She has no chest pain, shortness of breath, cough, or hemoptysis.  She denies any nausea, vomiting, constipation, or diarrhea.  She has no urinary complaints.  Patient offers no further specific complaints today.  REVIEW OF SYSTEMS:   Review of Systems  Constitutional: Negative.  Negative for fever, malaise/fatigue and weight loss.  Respiratory: Negative.  Negative for cough, hemoptysis and shortness of breath.   Cardiovascular: Negative.  Negative for chest pain and leg swelling.  Gastrointestinal: Negative.  Negative for abdominal pain.  Genitourinary: Negative.  Negative for dysuria.  Musculoskeletal: Negative.  Negative for neck pain.  Skin: Negative.  Negative for rash.  Neurological: Negative.  Negative for dizziness, focal weakness, weakness and headaches.  Psychiatric/Behavioral: Negative.  The patient is not nervous/anxious.    As per HPI. Otherwise, a complete review of systems is negative.   PAST MEDICAL HISTORY: Past Medical History:  Diagnosis Date   Abnormal mammogram, unspecified    Actinic keratosis    Breast screening, unspecified    Diabetes ([place])    Hypertension    Mammographic microcalcification    Squamous cell carcinoma of skin 06/14/2019   Left superior  forehead. Well differentiated. Tx: EDC   Squamous cell carcinoma of skin 09/13/2019   L of midline med forehead at hairline SCCIS    Squamous cell carcinoma of skin 04/23/2021   Left cheek, recurrent on 06/23/21 tx with [facility] again   Vaginal pessary present     PAST SURGICAL HISTORY: Past Surgical History:  Procedure Laterality Date   BREAST BIOPSY Left 2013   stereo calcs   BREAST EXCISIONAL BIOPSY Left    "years ago"-benign   ESOPHAGOGASTRODUODENOSCOPY Left 06/12/2018   Procedure: ESOPHAGOGASTRODUODENOSCOPY (EGD);  Surgeon: Virgel Manifold, MD;  Location: [facility] ENDOSCOPY;  Service: Endoscopy;  Laterality: Left;   ESOPHAGOGASTRODUODENOSCOPY N/A 08/31/2019   Procedure: ESOPHAGOGASTRODUODENOSCOPY (EGD);  Surgeon: Toledo, Benay Pike, MD;  Location: [facility] ENDOSCOPY;  Service: Gastroenterology;  Laterality: N/A;   SKIN LESION EXCISION      FAMILY HISTORY: Family History  Problem Relation Age of Onset   Cancer Mother    Breast cancer Neg Hx     ADVANCED DIRECTIVES (Y/N):  N  HEALTH MAINTENANCE: Social History   Tobacco Use   Smoking status: Never   Smokeless tobacco: Never  Vaping Use   Vaping Use: Never used  Substance Use Topics   Alcohol use: No    Alcohol/week: 0.0 standard drinks of alcohol   Drug use: No     Colonoscopy:  PAP:  Bone density:  Lipid panel:  Allergies  Allergen Reactions   Augmentin [Amoxicillin-Pot Clavulanate] Swelling    Facial and lip swelling 05/29/22   Oxycodone Hcl Other (See Comments)    Other Reaction: GI Upset Other reaction(s): Other (See Comments) Other Reaction: GI Upset  Oxycodone-Aspirin Other (See Comments)    Other Reaction: GI Upset (Per pt she does take aspirin 81 mg daily   Codeine Nausea Only   Etodolac Other (See Comments)   Iodine Nausea Only   Levaquin [Levofloxacin In D5w] Nausea Only   Levofloxacin Nausea Only   Metronidazole Nausea Only   Ultram [Tramadol] Nausea Only   Sulfa Antibiotics Nausea Only and  Other (See Comments)    Other reaction(s): Unknown     Current Outpatient Medications  Medication Sig Dispense Refill   aspirin EC 81 MG EC tablet Take 1 tablet (81 mg total) by mouth daily. Swallow whole. 30 tablet 0   clopidogrel (PLAVIX) 75 MG tablet Take 1 tablet (75 mg total) by mouth daily. 30 tablet 0   dexamethasone (DECADRON) 2 MG tablet Take 2 tablets (4 mg total) by mouth 2 (two) times daily with a meal. 60 tablet 0   diltiazem (CARDIZEM CD) 120 MG 24 hr capsule Take 1 capsule (120 mg total) by mouth daily. 30 capsule 0   donepezil (ARICEPT) 5 MG tablet Take 1 tablet by mouth every evening.     glipiZIDE (GLUCOTROL XL) 5 MG 24 hr tablet Take 5 mg by mouth 2 (two) times daily.      irbesartan-hydrochlorothiazide (AVALIDE) 300-12.5 MG tablet Take 1 tablet by mouth daily.     isosorbide mononitrate (IMDUR) 30 MG 24 hr tablet Take 1 tablet (30 mg total) by mouth daily. 30 tablet 0   LEVEMIR FLEXPEN 100 UNIT/ML FlexPen Inject into the skin.     levothyroxine (SYNTHROID, LEVOTHROID) 50 MCG tablet Take 50 mcg by mouth daily.      Magnesium Oxide (MAG-OXIDE) 200 MG TABS Take 2 tablets (400 mg total) by mouth 2 (two) times daily. 10 tablet 0   metoprolol tartrate (LOPRESSOR) 50 MG tablet Take 1 tablet (50 mg total) by mouth 2 (two) times daily. 60 tablet 0   nitroGLYCERIN (NITROSTAT) 0.4 MG SL tablet Place 1 tablet (0.4 mg total) under the tongue every 5 (five) minutes x 3 doses as needed for chest pain (max 3). 30 tablet 0   pioglitazone (ACTOS) 30 MG tablet Take 30 mg by mouth daily.     predniSONE (STERAPRED UNI-PAK 21 TAB) 10 MG (21) TBPK tablet Take by mouth.     TRADJENTA 5 MG TABS tablet Take 5 mg by mouth daily.      Vitamin D, Ergocalciferol, (DRISDOL) 1.25 MG (50000 UT) CAPS capsule Take 50,000 Units by mouth every Saturday.      ferrous sulfate 325 (65 FE) MG EC tablet Take 1 tablet (325 mg total) by mouth 2 (two) times daily. 60 tablet 3   pantoprazole (PROTONIX) 40 MG tablet  Take 1 tablet (40 mg total) by mouth daily. 30 tablet 1   No current facility-administered medications for this visit.    OBJECTIVE: Vitals:   06/23/22 0948  BP: (!) 126/57  Pulse: 72  Resp: 16  Temp: 98.2 F (36.8 C)  SpO2: 100%     Body mass index is 23.24 kg/m.    ECOG FS:0 - Asymptomatic  General: Well-developed, well-nourished, no acute distress. Eyes: Pink conjunctiva, anicteric sclera. HEENT: Normocephalic, moist mucous membranes.  Left parotid mass decreased in size.  No palpable lymphadenopathy.  Minimal erythema. Lungs: No audible wheezing or coughing. Heart: Regular rate and rhythm. Abdomen: Soft, nontender, no obvious distention. Musculoskeletal: No edema, cyanosis, or clubbing. Neuro: Alert, answering all questions appropriately. Cranial nerves grossly intact. Skin: No rashes or petechiae  noted. Psych: Normal affect.  LAB RESULTS:  Lab Results  Component Value Date   NA 139 06/23/2022   K 4.1 06/23/2022   CL 106 06/23/2022   CO2 27 06/23/2022   GLUCOSE 90 06/23/2022   BUN 37 (H) 06/23/2022   CREATININE 1.49 (H) 06/23/2022   CALCIUM 8.4 (L) 06/23/2022   PROT 6.0 (L) 06/23/2022   ALBUMIN 2.9 (L) 06/23/2022   AST 17 06/23/2022   ALT 14 06/23/2022   ALKPHOS 66 06/23/2022   BILITOT 1.0 06/23/2022   GFRNONAA 32 (L) 06/23/2022   GFRAA 32 (L) 11/07/2019    Lab Results  Component Value Date   WBC 8.6 06/23/2022   NEUTROABS 6.5 06/23/2022   HGB 12.8 06/23/2022   HCT 38.3 06/23/2022   MCV 97.2 06/23/2022   PLT 139 (L) 06/23/2022     STUDIES: MR Brain W Wo Contrast  Result Date: 06/11/2022 CLINICAL DATA:  Patient has a history of carcinoma of the parotid gland. Assess for intracranial Mets. Altered mental status EXAM: MRI HEAD WITHOUT AND WITH CONTRAST TECHNIQUE: Multiplanar, multiecho pulse sequences of the brain and surrounding structures were obtained without and with intravenous contrast. CONTRAST:  9m GADAVIST GADOBUTROL 1 MMOL/ML IV SOLN  COMPARISON:  CT Neck 05/29/22 FINDINGS: Brain: No acute infarction, hemorrhage, hydrocephalus, extra-axial collection or mass lesion. There is sequela of mild chronic microvascular ischemic change. Advanced generalized volume loss. No intracranial contrast-enhancing lesion. Vascular: Normal flow voids. Skull and upper cervical spine: Normal marrow signal. Sinuses/Orbits: Small bilateral mastoid effusions. Bilateral lens replacement. Scattered areas of mucosal thickening, predominantly involving the bilateral ethmoid, maxillary, and right sphenoid sinus. Greatest in the right sphenoid sinus. Other: Redemonstrated is a 2.9 x 4.2 x 1.7 cm peripherally enhancing lesion centered along the anterolateral aspect of the left parotid gland, compatible with biopsy-proven malignancy. This mass abuts and likely invades the left masseter muscle body. This region of contrast enhancement extends superiorly to the level of the left TMJ with asymmetric enhancement of the left facial nerve to the level of the stylomastoid foramen (see screenshots). There is likely also asymmetric enhancement of the mastoid segment of the left facial nerve to the level of the geniculate ganglion. Separate region of skin thickening along the left cheek is redemonstrated and remains worrisome for an additional site of malignancy, as seen on prior PET CT dated 04/16/2022 IMPRESSION: 1. Redemonstrated is a 2.9 x 4.2 x 1.7 cm peripherally enhancing lesion centered along the anterolateral aspect of the left parotid gland, compatible with biopsy-proven malignancy. This mass abuts and likely invades the lateral margin of the left masseter muscle. This region of contrast enhancement extends superiorly to the level of the left TMJ with asymmetric enhancement of the extratemporal and mastoid segments of the left facial nerve, which is worrisome for perineural spread of tumor. 2. Separate region of skin thickening along the left cheek is redemonstrated and remains  worrisome for an additional site of malignancy, as seen on prior PET CT dated 04/16/2022. 3. No evidence of intracranial metastatic disease. Electronically Signed   By: HMarin RobertsM.D.   On: 06/11/2022 05:49   CT SOFT TISSUE NECK WO CONTRAST  Result Date: 05/29/2022 CLINICAL DATA:  Provided history: Head/neck cancer, monitor malignancy of parotid gland with new lesion, labial swelling. Question allergic reaction versus radiation versus progressive disease. EXAM: CT NECK WITHOUT CONTRAST TECHNIQUE: Multidetector CT imaging of the neck was performed following the standard protocol without intravenous contrast. RADIATION DOSE REDUCTION: This exam was performed according to  the departmental dose-optimization program which includes automated exposure control, adjustment of the mA and/or kV according to patient size and/or use of iterative reconstruction technique. COMPARISON:  PET-CT 04/16/2022. Neck CT 03/25/2022. FINDINGS: Pharynx and larynx: Streak and beam hardening artifact arising from dental restoration partially obscures the oral cavity. No appreciable swelling or mass within the oral cavity, pharynx or larynx. Incidentally noted torus palatinus. Salivary glands: The centrally necrotic mass within the left parotid gland mass increased in size from the prior PET-CT of 04/16/2022, now measuring 4.0 x 3.2 cm in transaxial dimensions (previously measuring 3.0 x 2.3 cm). The mass now appears to involve the overlying skin. As before, the mass abuts the left masseter muscle. Infiltration of the left masseter muscle cannot be excluded. Thyroid: Prior right hemithyroidectomy. Subcentimeter nodule within the left thyroid lobe, not meeting consensus criteria for ultrasound follow-up based on size. No follow-up imaging is recommended. Reference: J Am Coll Radiol. 2015 Feb;12(2): 143-50. Lymph nodes: Mass within the left parotid gland as described above. Elsewhere within the neck, no pathologically enlarged lymph  nodes are identified. Vascular: The major vascular structures of the neck are incompletely assessed in the absence of intravenous contrast. Atherosclerotic plaque within the visualized aortic arch, proximal major branch vessels of the neck and within the carotid arteries. Limited intracranial: No evidence of acute intracranial abnormality within the field of view. Visualized orbits: No orbital mass or acute orbital finding. Mastoids and visualized paranasal sinuses: Extensive partial opacification of the right sphenoid sinus with associated chronic reactive osteitis. Mild mucosal thickening within the bilateral maxillary sinuses. Small-volume fluid within the bilateral mastoid air cells. Skeleton: Slight grade 1 anterolisthesis and right-sided facet joint ankylosis at C4-C5. Cervical spondylosis. No acute fracture or aggressive osseous lesion. Upper chest: No consolidation within the imaged lung apices. Other: Irregular focus of skin thickening within the left cheek, measuring 2.6 x 0.4 cm (for instance as seen on series 3, image 112). Reviewing the prior PET-CT of 04/16/2022, there was apparent hypermetabolism at this site. Streak and beam hardening artifact arising from dental restoration limits evaluation of the lips and perioral soft tissues. Within this limitation, no perioral mass is appreciated. Impressions #1 and #2 will be called to the ordering clinician or representative by the Radiologist Assistant, and communication documented in the PACS or [employer]. IMPRESSION: 1. The centrally necrotic mass within the left parotid gland has increased in size from the prior PET CT of 04/16/2022, now measuring 4.0 x 3.2 cm in transaxial dimensions (biopsy-proven malignancy). The mass now appears to involve the overlying skin. As before, the mass abuts the left masseter muscle and infiltration of the left masseter muscle cannot be excluded. 2. 2.6 x 0.4 cm focus of irregular skin thickening within the left  cheek, anterior to the parotid mass. Reviewing the prior PET-CT of 04/16/2022, there was apparent hypermetabolism at this site, and this finding is suspicious for a skin malignancy. Direct visualization is recommended and direct tissue sampling should be considered. 3. Streak and beam hardening artifact arising from dental restoration limits evaluation of the lips and perioral soft tissues. Within this limitation, there is no appreciable perioral soft tissue mass. 4. No pathologically enlarged lymph nodes identified elsewhere within the neck. 5. Paranasal sinus disease, as described. 6. Aortic Atherosclerosis (ICD10-I70.0). Electronically Signed   By: Kellie Simmering D.O.   On: 05/29/2022 15:53    ASSESSMENT: Stage II carcinoma left parotid gland  PLAN:    Stage II carcinoma left parotid gland: Biopsy results reviewed  independently.  PET scan completed on March 25, 2022 reviewed independently and reported as above 2.7 cm irregular hypermetabolic mass in the left parotid gland.  Given stage of disease, patient will benefit from treatment with XRT, but did not receive concurrent cisplatin given her advanced age and chronic renal insufficiency.  Continue daily XRT.  Clinically mass is improving.  Patient will complete XRT on July 09, 2022.  Return to clinic the second week of April for repeat imaging with PET scan and further evaluation.   Renal insufficiency: Chronic and unchanged.  Patient's most recent creatinine is 1.49.  No cisplatin as above. Thrombocytopenia: Mild, monitor.   Patient expressed understanding and was in agreement with this plan. She also understands that She can call clinic at any time with any questions, concerns, or complaints.    Cancer Staging  Carcinoma of parotid gland [facility]) Staging form: Major Salivary Glands, AJCC 8th Edition - Clinical stage from 04/17/2022: Stage II (cT2, cN0, cM0) - Signed by Lloyd Huger, MD on 04/17/2022 Stage prefix: Initial  diagnosis   Lloyd Huger, MD   06/23/2022 10:49 AM

## 2022-06-23 NOTE — Addendum Note (Signed)
Addended by: Philipp Deputy on: 06/23/2022 11:13 AM   Modules accepted: Orders

## 2022-06-24 ENCOUNTER — Ambulatory Visit
Admission: RE | Admit: 2022-06-24 | Discharge: 2022-06-24 | Disposition: A | Discharge status: Home / Self Care | Payer: Medicare HMO | Source: Ambulatory Visit | Attending: Radiation Oncology | Admitting: Radiation Oncology

## 2022-06-24 ENCOUNTER — Other Ambulatory Visit: Payer: Self-pay

## 2022-06-24 ENCOUNTER — Inpatient Hospital Stay: Payer: Medicare HMO

## 2022-06-24 DIAGNOSIS — Z510 Encounter for antineoplastic radiation therapy: Secondary | ICD-10-CM | POA: Diagnosis not present

## 2022-06-24 LAB — RAD ONC ARIA SESSION SUMMARY
Course Elapsed Days: 43
Plan Fractions Treated to Date: 24
Plan Prescribed Dose Per Fraction: 2 Gy
Plan Total Fractions Prescribed: 35
Plan Total Prescribed Dose: 70 Gy
Reference Point Dosage Given to Date: 48 Gy
Reference Point Session Dosage Given: 2 Gy
Session Number: 24

## 2022-06-25 ENCOUNTER — Ambulatory Visit
Admission: RE | Admit: 2022-06-25 | Discharge: 2022-06-25 | Disposition: A | Discharge status: Home / Self Care | Payer: Medicare HMO | Source: Ambulatory Visit | Attending: Radiation Oncology | Admitting: Radiation Oncology

## 2022-06-25 ENCOUNTER — Other Ambulatory Visit: Payer: Self-pay

## 2022-06-25 DIAGNOSIS — Z510 Encounter for antineoplastic radiation therapy: Secondary | ICD-10-CM | POA: Diagnosis not present

## 2022-06-25 LAB — RAD ONC ARIA SESSION SUMMARY
Course Elapsed Days: 44
Plan Fractions Treated to Date: 25
Plan Prescribed Dose Per Fraction: 2 Gy
Plan Total Fractions Prescribed: 35
Plan Total Prescribed Dose: 70 Gy
Reference Point Dosage Given to Date: 50 Gy
Reference Point Session Dosage Given: 2 Gy
Session Number: 25

## 2022-06-26 ENCOUNTER — Other Ambulatory Visit: Payer: Self-pay

## 2022-06-26 ENCOUNTER — Ambulatory Visit
Admission: RE | Admit: 2022-06-26 | Discharge: 2022-06-26 | Disposition: A | Discharge status: Home / Self Care | Payer: Medicare HMO | Source: Ambulatory Visit | Attending: Radiation Oncology | Admitting: Radiation Oncology

## 2022-06-26 DIAGNOSIS — Z510 Encounter for antineoplastic radiation therapy: Secondary | ICD-10-CM | POA: Diagnosis not present

## 2022-06-26 LAB — RAD ONC ARIA SESSION SUMMARY
Course Elapsed Days: 45
Plan Fractions Treated to Date: 26
Plan Prescribed Dose Per Fraction: 2 Gy
Plan Total Fractions Prescribed: 35
Plan Total Prescribed Dose: 70 Gy
Reference Point Dosage Given to Date: 52 Gy
Reference Point Session Dosage Given: 2 Gy
Session Number: 26

## 2022-06-29 ENCOUNTER — Other Ambulatory Visit: Payer: Self-pay

## 2022-06-29 ENCOUNTER — Ambulatory Visit
Admission: RE | Admit: 2022-06-29 | Discharge: 2022-06-29 | Disposition: A | Discharge status: Home / Self Care | Payer: Medicare HMO | Source: Ambulatory Visit | Attending: Radiation Oncology | Admitting: Radiation Oncology

## 2022-06-29 DIAGNOSIS — Z510 Encounter for antineoplastic radiation therapy: Secondary | ICD-10-CM | POA: Diagnosis not present

## 2022-06-29 LAB — RAD ONC ARIA SESSION SUMMARY
Course Elapsed Days: 48
Plan Fractions Treated to Date: 27
Plan Prescribed Dose Per Fraction: 2 Gy
Plan Total Fractions Prescribed: 35
Plan Total Prescribed Dose: 70 Gy
Reference Point Dosage Given to Date: 54 Gy
Reference Point Session Dosage Given: 2 Gy
Session Number: 27

## 2022-06-30 ENCOUNTER — Ambulatory Visit
Admission: RE | Admit: 2022-06-30 | Discharge: 2022-06-30 | Disposition: A | Discharge status: Home / Self Care | Payer: Medicare HMO | Source: Ambulatory Visit | Attending: Radiation Oncology | Admitting: Radiation Oncology

## 2022-06-30 ENCOUNTER — Other Ambulatory Visit: Payer: Self-pay

## 2022-06-30 DIAGNOSIS — Z510 Encounter for antineoplastic radiation therapy: Secondary | ICD-10-CM | POA: Diagnosis not present

## 2022-06-30 LAB — RAD ONC ARIA SESSION SUMMARY
Course Elapsed Days: 49
Plan Fractions Treated to Date: 28
Plan Prescribed Dose Per Fraction: 2 Gy
Plan Total Fractions Prescribed: 35
Plan Total Prescribed Dose: 70 Gy
Reference Point Dosage Given to Date: 56 Gy
Reference Point Session Dosage Given: 2 Gy
Session Number: 28

## 2022-07-01 ENCOUNTER — Other Ambulatory Visit: Payer: Self-pay

## 2022-07-01 ENCOUNTER — Ambulatory Visit: Payer: Medicare HMO

## 2022-07-01 ENCOUNTER — Ambulatory Visit
Admission: RE | Admit: 2022-07-01 | Discharge: 2022-07-01 | Disposition: A | Discharge status: Home / Self Care | Payer: Medicare HMO | Source: Ambulatory Visit | Attending: Radiation Oncology | Admitting: Radiation Oncology

## 2022-07-01 DIAGNOSIS — Z510 Encounter for antineoplastic radiation therapy: Secondary | ICD-10-CM | POA: Diagnosis not present

## 2022-07-01 LAB — RAD ONC ARIA SESSION SUMMARY
Course Elapsed Days: 50
Plan Fractions Treated to Date: 29
Plan Prescribed Dose Per Fraction: 2 Gy
Plan Total Fractions Prescribed: 35
Plan Total Prescribed Dose: 70 Gy
Reference Point Dosage Given to Date: 58 Gy
Reference Point Session Dosage Given: 2 Gy
Session Number: 29

## 2022-07-02 ENCOUNTER — Other Ambulatory Visit: Payer: Self-pay

## 2022-07-02 ENCOUNTER — Ambulatory Visit
Admission: RE | Admit: 2022-07-02 | Discharge: 2022-07-02 | Disposition: A | Discharge status: Home / Self Care | Payer: Medicare HMO | Source: Ambulatory Visit | Attending: Radiation Oncology | Admitting: Radiation Oncology

## 2022-07-02 ENCOUNTER — Ambulatory Visit: Payer: Medicare HMO

## 2022-07-02 DIAGNOSIS — Z510 Encounter for antineoplastic radiation therapy: Secondary | ICD-10-CM | POA: Diagnosis not present

## 2022-07-02 LAB — RAD ONC ARIA SESSION SUMMARY
Course Elapsed Days: 51
Plan Fractions Treated to Date: 30
Plan Prescribed Dose Per Fraction: 2 Gy
Plan Total Fractions Prescribed: 35
Plan Total Prescribed Dose: 70 Gy
Reference Point Dosage Given to Date: 60 Gy
Reference Point Session Dosage Given: 2 Gy
Session Number: 30

## 2022-07-03 ENCOUNTER — Ambulatory Visit
Admission: RE | Admit: 2022-07-03 | Discharge: 2022-07-03 | Disposition: A | Discharge status: Home / Self Care | Payer: Medicare HMO | Source: Ambulatory Visit | Attending: Radiation Oncology | Admitting: Radiation Oncology

## 2022-07-03 ENCOUNTER — Other Ambulatory Visit: Payer: Self-pay

## 2022-07-03 DIAGNOSIS — Z510 Encounter for antineoplastic radiation therapy: Secondary | ICD-10-CM | POA: Diagnosis not present

## 2022-07-03 LAB — RAD ONC ARIA SESSION SUMMARY
Course Elapsed Days: 52
Plan Fractions Treated to Date: 31
Plan Prescribed Dose Per Fraction: 2 Gy
Plan Total Fractions Prescribed: 35
Plan Total Prescribed Dose: 70 Gy
Reference Point Dosage Given to Date: 62 Gy
Reference Point Session Dosage Given: 2 Gy
Session Number: 31

## 2022-07-06 ENCOUNTER — Other Ambulatory Visit: Payer: Self-pay

## 2022-07-06 ENCOUNTER — Ambulatory Visit
Admission: RE | Admit: 2022-07-06 | Discharge: 2022-07-06 | Disposition: A | Discharge status: Home / Self Care | Payer: Medicare HMO | Source: Ambulatory Visit | Attending: Radiation Oncology | Admitting: Radiation Oncology

## 2022-07-06 DIAGNOSIS — Z510 Encounter for antineoplastic radiation therapy: Secondary | ICD-10-CM | POA: Diagnosis not present

## 2022-07-06 LAB — RAD ONC ARIA SESSION SUMMARY
Course Elapsed Days: 55
Plan Fractions Treated to Date: 32
Plan Prescribed Dose Per Fraction: 2 Gy
Plan Total Fractions Prescribed: 35
Plan Total Prescribed Dose: 70 Gy
Reference Point Dosage Given to Date: 64 Gy
Reference Point Session Dosage Given: 2 Gy
Session Number: 32

## 2022-07-07 ENCOUNTER — Ambulatory Visit
Admission: RE | Admit: 2022-07-07 | Discharge: 2022-07-07 | Disposition: A | Discharge status: Home / Self Care | Payer: Medicare HMO | Source: Ambulatory Visit | Attending: Radiation Oncology | Admitting: Radiation Oncology

## 2022-07-07 ENCOUNTER — Other Ambulatory Visit: Payer: Self-pay

## 2022-07-07 DIAGNOSIS — Z510 Encounter for antineoplastic radiation therapy: Secondary | ICD-10-CM | POA: Diagnosis not present

## 2022-07-07 LAB — RAD ONC ARIA SESSION SUMMARY
Course Elapsed Days: 56
Plan Fractions Treated to Date: 33
Plan Prescribed Dose Per Fraction: 2 Gy
Plan Total Fractions Prescribed: 35
Plan Total Prescribed Dose: 70 Gy
Reference Point Dosage Given to Date: 66 Gy
Reference Point Session Dosage Given: 2 Gy
Session Number: 33

## 2022-07-08 ENCOUNTER — Ambulatory Visit: Payer: Medicare HMO

## 2022-07-08 ENCOUNTER — Other Ambulatory Visit: Payer: Self-pay

## 2022-07-08 ENCOUNTER — Ambulatory Visit
Admission: RE | Admit: 2022-07-08 | Discharge: 2022-07-08 | Disposition: A | Discharge status: Home / Self Care | Payer: Medicare HMO | Source: Ambulatory Visit | Attending: Radiation Oncology | Admitting: Radiation Oncology

## 2022-07-08 DIAGNOSIS — Z510 Encounter for antineoplastic radiation therapy: Secondary | ICD-10-CM | POA: Diagnosis not present

## 2022-07-08 LAB — RAD ONC ARIA SESSION SUMMARY
Course Elapsed Days: 57
Plan Fractions Treated to Date: 34
Plan Prescribed Dose Per Fraction: 2 Gy
Plan Total Fractions Prescribed: 35
Plan Total Prescribed Dose: 70 Gy
Reference Point Dosage Given to Date: 68 Gy
Reference Point Session Dosage Given: 2 Gy
Session Number: 34

## 2022-07-09 ENCOUNTER — Ambulatory Visit
Admission: RE | Admit: 2022-07-09 | Discharge: 2022-07-09 | Disposition: A | Discharge status: Home / Self Care | Payer: Medicare HMO | Source: Ambulatory Visit | Attending: Radiation Oncology | Admitting: Radiation Oncology

## 2022-07-09 ENCOUNTER — Other Ambulatory Visit: Payer: Self-pay

## 2022-07-09 DIAGNOSIS — C07 Malignant neoplasm of parotid gland: Secondary | ICD-10-CM | POA: Diagnosis not present

## 2022-07-09 DIAGNOSIS — Z510 Encounter for antineoplastic radiation therapy: Secondary | ICD-10-CM | POA: Diagnosis present

## 2022-07-09 LAB — RAD ONC ARIA SESSION SUMMARY
Course Elapsed Days: 58
Plan Fractions Treated to Date: 35
Plan Prescribed Dose Per Fraction: 2 Gy
Plan Total Fractions Prescribed: 35
Plan Total Prescribed Dose: 70 Gy
Reference Point Dosage Given to Date: 70 Gy
Reference Point Session Dosage Given: 2 Gy
Session Number: 35

## 2022-08-12 ENCOUNTER — Ambulatory Visit
Admission: RE | Admit: 2022-08-12 | Discharge: 2022-08-12 | Disposition: A | Discharge status: Home / Self Care | Payer: Medicare HMO | Source: Ambulatory Visit | Attending: Radiation Oncology | Admitting: Radiation Oncology

## 2022-08-12 ENCOUNTER — Encounter: Payer: Self-pay | Admitting: Radiation Oncology

## 2022-08-12 VITALS — BP 142/71 | HR 57 | Temp 97.1°F | Resp 12 | Wt 135.0 lb

## 2022-08-12 DIAGNOSIS — Z923 Personal history of irradiation: Secondary | ICD-10-CM | POA: Diagnosis not present

## 2022-08-12 DIAGNOSIS — C07 Malignant neoplasm of parotid gland: Secondary | ICD-10-CM | POA: Diagnosis present

## 2022-08-12 NOTE — Progress Notes (Signed)
Radiation Oncology Follow up Note  Name: Erika Ochoa   Date:   08/12/2022 MRN:  [id number] DOB: 01-Aug-1924    This 89 y.o. female presents to the clinic today for 1 month follow-up status post radiation therapy to her left parotid gland for squamous cell carcinoma.  REFERRING PROVIDER: Maryland Pink, MD  HPI: Patient is a 89 year old female now out 1 month having completed external beam radiation therapy for a large squamous cell carcinoma the left parotid gland with dermal involvement.  Seen today in follow-up she had a complete response.  There are some slight granulation tissue in the center of the lesion no other visible nodularity are noted no cervical lymph nodes were involved.  She has been having some gustatory salivation most likely related to rerouting of her salivary neural involvement to sweat glands.  COMPLICATIONS OF TREATMENT: none  FOLLOW UP COMPLIANCE: keeps appointments   PHYSICAL EXAM:  BP (!) 142/71   Pulse (!) 57   Temp (!) 97.1 F (36.2 C) (Tympanic)   Resp 12   Wt 135 lb (61.2 kg)   BMI 21.79 kg/m  Area of the previous tumor is completely resolved.  Some scar tissue with granulation tissue in the center is present no evidence of adenopathy in the neck region is noted.  Well-developed well-nourished patient in NAD. HEENT reveals PERLA, EOMI, discs not visualized.  Oral cavity is clear. No oral mucosal lesions are identified. Neck is clear without evidence of cervical or supraclavicular adenopathy. Lungs are clear to A&P. Cardiac examination is essentially unremarkable with regular rate and rhythm without murmur rub or thrill. Abdomen is benign with no organomegaly or masses noted. Motor sensory and DTR levels are equal and symmetric in the upper and lower extremities. Cranial nerves II through XII are grossly intact. Proprioception is intact. No peripheral adenopathy or edema is identified. No motor or sensory levels are noted. Crude visual fields are within normal  range.  RADIOLOGY RESULTS: No current films for review  PLAN: The present time I have asked to see her back in 4 to 5 months for follow-up.  I will then turn follow-up care over to dermatology.  Patient comprehends my recommendations well.  Daughter was present for my consultation.  I would like to take this opportunity to thank you for allowing me to participate in the care of your patient.Noreene Filbert, MD

## 2022-08-22 ENCOUNTER — Other Ambulatory Visit: Payer: Self-pay | Admitting: Radiation Oncology

## 2022-09-22 ENCOUNTER — Inpatient Hospital Stay: Payer: Medicare HMO

## 2022-09-22 ENCOUNTER — Ambulatory Visit: Payer: Medicare HMO

## 2022-09-25 ENCOUNTER — Ambulatory Visit: Payer: Medicare HMO | Admitting: Oncology

## 2022-09-28 ENCOUNTER — Inpatient Hospital Stay: Payer: Medicare HMO | Attending: Oncology

## 2022-09-28 ENCOUNTER — Ambulatory Visit
Admission: RE | Admit: 2022-09-28 | Discharge: 2022-09-28 | Disposition: A | Discharge status: Home / Self Care | Payer: Medicare HMO | Source: Ambulatory Visit | Attending: Oncology | Admitting: Oncology

## 2022-09-28 DIAGNOSIS — N189 Chronic kidney disease, unspecified: Secondary | ICD-10-CM | POA: Diagnosis not present

## 2022-09-28 DIAGNOSIS — Z79899 Other long term (current) drug therapy: Secondary | ICD-10-CM | POA: Insufficient documentation

## 2022-09-28 DIAGNOSIS — R590 Localized enlarged lymph nodes: Secondary | ICD-10-CM | POA: Insufficient documentation

## 2022-09-28 DIAGNOSIS — C07 Malignant neoplasm of parotid gland: Secondary | ICD-10-CM | POA: Insufficient documentation

## 2022-09-28 DIAGNOSIS — Z7982 Long term (current) use of aspirin: Secondary | ICD-10-CM | POA: Insufficient documentation

## 2022-09-28 DIAGNOSIS — J984 Other disorders of lung: Secondary | ICD-10-CM | POA: Diagnosis not present

## 2022-09-28 DIAGNOSIS — D649 Anemia, unspecified: Secondary | ICD-10-CM | POA: Insufficient documentation

## 2022-09-28 LAB — CBC WITH DIFFERENTIAL/PLATELET
Abs Immature Granulocytes: 0.03 10*3/uL (ref 0.00–0.07)
Basophils Absolute: 0.1 10*3/uL (ref 0.0–0.1)
Basophils Relative: 1 %
Eosinophils Absolute: 0.1 10*3/uL (ref 0.0–0.5)
Eosinophils Relative: 2 %
HCT: 34.8 % — ABNORMAL LOW (ref 36.0–46.0)
Hemoglobin: 11.4 g/dL — ABNORMAL LOW (ref 12.0–15.0)
Immature Granulocytes: 0 %
Lymphocytes Relative: 21 %
Lymphs Abs: 1.5 10*3/uL (ref 0.7–4.0)
MCH: 32 pg (ref 26.0–34.0)
MCHC: 32.8 g/dL (ref 30.0–36.0)
MCV: 97.8 fL (ref 80.0–100.0)
Monocytes Absolute: 0.8 10*3/uL (ref 0.1–1.0)
Monocytes Relative: 11 %
Neutro Abs: 4.7 10*3/uL (ref 1.7–7.7)
Neutrophils Relative %: 65 %
Platelets: 273 10*3/uL (ref 150–400)
RBC: 3.56 MIL/uL — ABNORMAL LOW (ref 3.87–5.11)
RDW: 15.3 % (ref 11.5–15.5)
WBC: 7.2 10*3/uL (ref 4.0–10.5)
nRBC: 0 % (ref 0.0–0.2)

## 2022-09-28 LAB — COMPREHENSIVE METABOLIC PANEL
ALT: 9 U/L (ref 0–44)
AST: 20 U/L (ref 15–41)
Albumin: 3.6 g/dL (ref 3.5–5.0)
Alkaline Phosphatase: 64 U/L (ref 38–126)
Anion gap: 10 (ref 5–15)
BUN: 29 mg/dL — ABNORMAL HIGH (ref 8–23)
CO2: 25 mmol/L (ref 22–32)
Calcium: 9.1 mg/dL (ref 8.9–10.3)
Chloride: 102 mmol/L (ref 98–111)
Creatinine, Ser: 1.54 mg/dL — ABNORMAL HIGH (ref 0.44–1.00)
GFR, Estimated: 31 mL/min — ABNORMAL LOW (ref >60)
Glucose, Bld: 73 mg/dL (ref 70–99)
Potassium: 4 mmol/L (ref 3.5–5.1)
Sodium: 137 mmol/L (ref 135–145)
Total Bilirubin: 0.7 mg/dL (ref 0.3–1.2)
Total Protein: 7 g/dL (ref 6.5–8.1)

## 2022-09-28 LAB — GLUCOSE, CAPILLARY: Glucose-Capillary: 68 mg/dL — ABNORMAL LOW (ref 70–99)

## 2022-09-28 MED ORDER — FLUDEOXYGLUCOSE F - 18 (FDG) INJECTION
7.2900 | Freq: Once | INTRAVENOUS | Status: AC | PRN
  Administered 2022-09-28: 7.29 via INTRAVENOUS

## 2022-10-02 ENCOUNTER — Inpatient Hospital Stay (HOSPITAL_BASED_OUTPATIENT_CLINIC_OR_DEPARTMENT_OTHER): Payer: Medicare HMO | Admitting: Oncology

## 2022-10-02 ENCOUNTER — Encounter: Payer: Self-pay | Admitting: Oncology

## 2022-10-02 VITALS — BP 124/51 | HR 57 | Temp 97.8°F | Resp 14 | Ht 66.0 in | Wt 139.0 lb

## 2022-10-02 DIAGNOSIS — C07 Malignant neoplasm of parotid gland: Secondary | ICD-10-CM

## 2022-10-02 NOTE — Progress Notes (Signed)
[facility]  Telephone:(336) [phone] Fax:(336) [phone]  ID: Erika Ochoa OB: 07/25/1924  MR#: [id number]  AOZ#:[id number]  Patient Care Team: Jerl Mina, MD as PCP - General (Family Medicine) Jeralyn Ruths, MD as Consulting Physician (Oncology)  CHIEF COMPLAINT: Stage II carcinoma left parotid gland.  INTERVAL HISTORY: Patient returns to clinic today for further evaluation and discussion of her PET scan results.  She currently feels well and is asymptomatic.  She no longer has pain. She has no neurologic complaints.  She denies any recent fevers or illnesses.  She has a good appetite and denies weight loss.  She has no chest pain, shortness of breath, cough, or hemoptysis.  She denies any nausea, vomiting, constipation, or diarrhea.  She has no urinary complaints.  Patient offers no specific complaints today.  REVIEW OF SYSTEMS:   Review of Systems  Constitutional: Negative.  Negative for fever, malaise/fatigue and weight loss.  Respiratory: Negative.  Negative for cough, hemoptysis and shortness of breath.   Cardiovascular: Negative.  Negative for chest pain and leg swelling.  Gastrointestinal: Negative.  Negative for abdominal pain.  Genitourinary: Negative.  Negative for dysuria.  Musculoskeletal: Negative.  Negative for neck pain.  Skin: Negative.  Negative for rash.  Neurological: Negative.  Negative for dizziness, focal weakness, weakness and headaches.  Psychiatric/Behavioral: Negative.  The patient is not nervous/anxious.    As per HPI. Otherwise, a complete review of systems is negative.   PAST MEDICAL HISTORY: Past Medical History:  Diagnosis Date   Abnormal mammogram, unspecified    Actinic keratosis    Breast screening, unspecified    Diabetes (HCC)    Hypertension    Mammographic microcalcification    Squamous cell carcinoma of skin 06/14/2019   Left superior forehead. Well differentiated. Tx: EDC   Squamous cell carcinoma of skin  09/13/2019   L of midline med forehead at hairline SCCIS    Squamous cell carcinoma of skin 04/23/2021   Left cheek, recurrent on 06/23/21 tx with [facility] again   Vaginal pessary present     PAST SURGICAL HISTORY: Past Surgical History:  Procedure Laterality Date   BREAST BIOPSY Left 2013   stereo calcs   BREAST EXCISIONAL BIOPSY Left    "years ago"-benign   ESOPHAGOGASTRODUODENOSCOPY Left 06/12/2018   Procedure: ESOPHAGOGASTRODUODENOSCOPY (EGD);  Surgeon: Pasty Spillers, MD;  Location: [facility] ENDOSCOPY;  Service: Endoscopy;  Laterality: Left;   ESOPHAGOGASTRODUODENOSCOPY N/A 08/31/2019   Procedure: ESOPHAGOGASTRODUODENOSCOPY (EGD);  Surgeon: Toledo, Boykin Nearing, MD;  Location: [facility] ENDOSCOPY;  Service: Gastroenterology;  Laterality: N/A;   SKIN LESION EXCISION      FAMILY HISTORY: Family History  Problem Relation Age of Onset   Cancer Mother    Breast cancer Neg Hx     ADVANCED DIRECTIVES (Y/N):  N  HEALTH MAINTENANCE: Social History   Tobacco Use   Smoking status: Never   Smokeless tobacco: Never  Vaping Use   Vaping Use: Never used  Substance Use Topics   Alcohol use: No    Alcohol/week: 0.0 standard drinks of alcohol   Drug use: No     Colonoscopy:  PAP:  Bone density:  Lipid panel:  Allergies  Allergen Reactions   Augmentin [Amoxicillin-Pot Clavulanate] Swelling    Facial and lip swelling 05/29/22   Oxycodone Hcl Other (See Comments)    Other Reaction: GI Upset Other reaction(s): Other (See Comments) Other Reaction: GI Upset    Oxycodone-Aspirin Other (See Comments)    Other Reaction: GI Upset (Per  pt she does take aspirin 81 mg daily   Codeine Nausea Only   Etodolac Other (See Comments)   Iodine Nausea Only   Levaquin [Levofloxacin In D5w] Nausea Only   Levofloxacin Nausea Only   Metronidazole Nausea Only   Ultram [Tramadol] Nausea Only   Sulfa Antibiotics Nausea Only and Other (See Comments)    Other reaction(s): Unknown     Current Outpatient  Medications  Medication Sig Dispense Refill   aspirin EC 81 MG EC tablet Take 1 tablet (81 mg total) by mouth daily. Swallow whole. 30 tablet 0   clopidogrel (PLAVIX) 75 MG tablet Take 1 tablet (75 mg total) by mouth daily. 30 tablet 0   diltiazem (CARDIZEM CD) 120 MG 24 hr capsule Take 1 capsule (120 mg total) by mouth daily. 30 capsule 0   donepezil (ARICEPT) 5 MG tablet Take 1 tablet by mouth every evening.     glipiZIDE (GLUCOTROL XL) 5 MG 24 hr tablet Take 5 mg by mouth 2 (two) times daily.      irbesartan-hydrochlorothiazide (AVALIDE) 300-12.5 MG tablet Take 1 tablet by mouth daily.     isosorbide mononitrate (IMDUR) 30 MG 24 hr tablet Take 1 tablet (30 mg total) by mouth daily. 30 tablet 0   LEVEMIR FLEXPEN 100 UNIT/ML FlexPen Inject into the skin.     levothyroxine (SYNTHROID, LEVOTHROID) 50 MCG tablet Take 50 mcg by mouth daily.      Magnesium Oxide (MAG-OXIDE) 200 MG TABS Take 2 tablets (400 mg total) by mouth 2 (two) times daily. 10 tablet 0   metoprolol tartrate (LOPRESSOR) 50 MG tablet Take 1 tablet (50 mg total) by mouth 2 (two) times daily. 60 tablet 0   nitroGLYCERIN (NITROSTAT) 0.4 MG SL tablet Place 1 tablet (0.4 mg total) under the tongue every 5 (five) minutes x 3 doses as needed for chest pain (max 3). 30 tablet 0   pioglitazone (ACTOS) 30 MG tablet Take 30 mg by mouth daily.     TRADJENTA 5 MG TABS tablet Take 5 mg by mouth daily.      Vitamin D, Ergocalciferol, (DRISDOL) 1.25 MG (50000 UT) CAPS capsule Take 50,000 Units by mouth every Saturday.      ferrous sulfate 325 (65 FE) MG EC tablet Take 1 tablet (325 mg total) by mouth 2 (two) times daily. 60 tablet 3   pantoprazole (PROTONIX) 40 MG tablet Take 1 tablet (40 mg total) by mouth daily. 30 tablet 1   No current facility-administered medications for this visit.    OBJECTIVE: Vitals:   10/02/22 0951  BP: (!) 124/51  Pulse: (!) 57  Resp: 14  Temp: 97.8 F (36.6 C)  SpO2: 99%     Body mass index is 22.44 kg/m.     ECOG FS:0 - Asymptomatic  General: Well-developed, well-nourished, no acute distress. Eyes: Pink conjunctiva, anicteric sclera. HEENT: Normocephalic, moist mucous membranes.  Left parotid mass resolved. Lungs: No audible wheezing or coughing. Heart: Regular rate and rhythm. Abdomen: Soft, nontender, no obvious distention. Musculoskeletal: No edema, cyanosis, or clubbing. Neuro: Alert, answering all questions appropriately. Cranial nerves grossly intact. Skin: No rashes or petechiae noted. Psych: Normal affect.   LAB RESULTS:  Lab Results  Component Value Date   NA 137 09/28/2022   K 4.0 09/28/2022   CL 102 09/28/2022   CO2 25 09/28/2022   GLUCOSE 73 09/28/2022   BUN 29 (H) 09/28/2022   CREATININE 1.54 (H) 09/28/2022   CALCIUM 9.1 09/28/2022   PROT 7.0 09/28/2022  ALBUMIN 3.6 09/28/2022   AST 20 09/28/2022   ALT 9 09/28/2022   ALKPHOS 64 09/28/2022   BILITOT 0.7 09/28/2022   GFRNONAA 31 (L) 09/28/2022   GFRAA 32 (L) 11/07/2019    Lab Results  Component Value Date   WBC 7.2 09/28/2022   NEUTROABS 4.7 09/28/2022   HGB 11.4 (L) 09/28/2022   HCT 34.8 (L) 09/28/2022   MCV 97.8 09/28/2022   PLT 273 09/28/2022     STUDIES: NM PET Image Restag (PS) Skull Base To Thigh  Result Date: 09/30/2022 CLINICAL DATA:  Subsequent treatment strategy for parotid gland carcinoma. EXAM: NUCLEAR MEDICINE PET SKULL BASE TO THIGH TECHNIQUE: 7.29 mCi F-18 FDG was injected intravenously. Full-ring PET imaging was performed from the skull base to thigh after the radiotracer. CT data was obtained and used for attenuation correction and anatomic localization. Fasting blood glucose: 68 mg/dl COMPARISON:  PET-CT 16/03/9603 FINDINGS: Mediastinal blood pool activity: SUV max 2.08 Liver activity: SUV max NA NECK: Resolution of the left parotid mass. No measurable residual tumor or residual hypermetabolism. Changes of radiation involving the subcutaneous tissues and adjacent left masseter muscle. No  cervical lymphadenopathy. Moderate hypermetabolism noted in the left longus coli muscle, likely radiation related. Incidental CT findings: Stable scattered carotid artery calcifications. CHEST: Unfortunately, there is new metastatic disease involving the chest. There are new several new hypermetabolic pulmonary nodules in both lungs and right hilar adenopathy. New slightly cavitary 14 mm right upper lobe nodule on image 43/4 has an SUV max of 13.38. 17 mm cavitary medial right upper lobe lesion on image 53/4 has an SUV max of 12.05. Adjacent 14 mm right hilar node has an SUV max of 14.86. Other scattered small hypermetabolic pulmonary nodules in both lungs. No hypermetabolic breast lesions, supraclavicular or axillary adenopathy. Incidental CT findings: Stable cardiac enlargement and vascular disease. Small pericardial effusion. ABDOMEN/PELVIS: No abnormal hypermetabolic activity within the liver, pancreas, adrenal glands, or spleen. No hypermetabolic lymph nodes in the abdomen or pelvis. Incidental CT findings: Advanced vascular disease is stable. Stable 6 cm right adnexal cyst without hypermetabolism. No further imaging evaluation or follow-up is necessary. Stable severe sigmoid colon diverticulosis. Pessary ring again noted in the vagina. SKELETON: No findings for osseous metastatic disease. Incidental CT findings: None. IMPRESSION: 1. Interval resolution of the left parotid mass without findings for residual hypermetabolic tumor or cervical adenopathy. 2. New hypermetabolic pulmonary lesions and right hilar adenopathy consistent with metastatic disease. 3. No findings for metastatic disease involving the abdomen/pelvis or bony structures. Electronically Signed   By: Rudie Meyer M.D.   On: 09/30/2022 08:46    ASSESSMENT: Stage II carcinoma left parotid gland  PLAN:    Stage II carcinoma left parotid gland: Biopsy results reviewed independently.  PET scan completed on March 25, 2022 reviewed  independently and reported as above 2.7 cm irregular hypermetabolic mass in the left parotid gland.  Given stage of disease, patient will benefit from treatment with XRT, but did not receive concurrent cisplatin given her advanced age and chronic renal insufficiency.  She completed treatment on July 09, 2022.  PET scan results from September 30, 2022 reviewed independently and report as above with complete resolution of parotid mass, unfortunately patient noted to have multiple pulmonary nodules consistent with metastatic disease.  We discussed the possibility of treating with single agent Keytruda every 3 weeks or continued active surveillance.  Patient plans to further discuss with her daughters over the weekend and call clinic as to her decision.  Renal insufficiency: Chronic and unchanged.  Patient's most recent creatinine is 1.54.  No cisplatin as above. Thrombocytopenia: Resolved. Anemia: Mild, monitor.  Patient's hemoglobin is 11.4.  Patient expressed understanding and was in agreement with this plan. She also understands that She can call clinic at any time with any questions, concerns, or complaints.    Cancer Staging  Carcinoma of parotid gland [facility]) Staging form: Major Salivary Glands, AJCC 8th Edition - Clinical stage from 04/17/2022: Stage II (cT2, cN0, cM0) - Signed by Jeralyn Ruths, MD on 04/17/2022 Stage prefix: Initial diagnosis   Jeralyn Ruths, MD   10/02/2022 12:18 PM

## 2022-10-08 ENCOUNTER — Telehealth: Payer: Self-pay

## 2022-10-08 ENCOUNTER — Telehealth: Payer: Self-pay | Admitting: *Deleted

## 2022-10-08 NOTE — Telephone Encounter (Signed)
I just spoke with them. They will contact me back next week to get a virtual visit scheduled with Dr. Orlie Dakin when it is convenient for everyone. Thanks!

## 2022-10-08 NOTE — Telephone Encounter (Signed)
Erika Ochoa called stating she was told to call today about scheduling Keytruda, but she said they have questions to ask. Please return her call.

## 2022-10-08 NOTE — Telephone Encounter (Signed)
Contacted patient and family in regards to their decision on Keytruda. They still have not decided. The daughter expressed concerns about side effects and had additional questions. I offered them to have a virtual visit next week for any additional concerns and so the family could be involved. They will call back when they find a time that works best for everyone.

## 2022-10-12 ENCOUNTER — Other Ambulatory Visit: Payer: Self-pay | Admitting: *Deleted

## 2022-10-12 NOTE — Progress Notes (Signed)
error 

## 2022-10-14 ENCOUNTER — Ambulatory Visit (INDEPENDENT_AMBULATORY_CARE_PROVIDER_SITE_OTHER): Payer: Medicare HMO | Admitting: Dermatology

## 2022-10-14 VITALS — BP 120/60

## 2022-10-14 DIAGNOSIS — L578 Other skin changes due to chronic exposure to nonionizing radiation: Secondary | ICD-10-CM

## 2022-10-14 DIAGNOSIS — Z872 Personal history of diseases of the skin and subcutaneous tissue: Secondary | ICD-10-CM

## 2022-10-14 DIAGNOSIS — D692 Other nonthrombocytopenic purpura: Secondary | ICD-10-CM

## 2022-10-14 DIAGNOSIS — L570 Actinic keratosis: Secondary | ICD-10-CM

## 2022-10-14 DIAGNOSIS — L0201 Cutaneous abscess of face: Secondary | ICD-10-CM

## 2022-10-14 DIAGNOSIS — L814 Other melanin hyperpigmentation: Secondary | ICD-10-CM

## 2022-10-14 DIAGNOSIS — X32XXXA Exposure to sunlight, initial encounter: Secondary | ICD-10-CM

## 2022-10-14 DIAGNOSIS — W908XXA Exposure to other nonionizing radiation, initial encounter: Secondary | ICD-10-CM | POA: Diagnosis not present

## 2022-10-14 DIAGNOSIS — Z1283 Encounter for screening for malignant neoplasm of skin: Secondary | ICD-10-CM

## 2022-10-14 DIAGNOSIS — Z8589 Personal history of malignant neoplasm of other organs and systems: Secondary | ICD-10-CM

## 2022-10-14 DIAGNOSIS — Z86007 Personal history of in-situ neoplasm of skin: Secondary | ICD-10-CM

## 2022-10-14 DIAGNOSIS — L821 Other seborrheic keratosis: Secondary | ICD-10-CM

## 2022-10-14 MED ORDER — MUPIROCIN 2 % EX OINT: 1.0000 | TOPICAL_OINTMENT | Freq: Every day | CUTANEOUS | 0 refills | Status: DC

## 2022-10-14 NOTE — Patient Instructions (Signed)
Cryotherapy Aftercare  Wash gently with soap and water everyday.   Apply Vaseline and Band-Aid daily until healed.     Due to recent changes in healthcare laws, you may see results of your pathology and/or laboratory studies on MyChart before the doctors have had a chance to review them. We understand that in some cases there may be results that are confusing or concerning to you. Please understand that not all results are received at the same time and often the doctors may need to interpret multiple results in order to provide you with the best plan of care or course of treatment. Therefore, we ask that you please give us 2 business days to thoroughly review all your results before contacting the office for clarification. Should we see a critical lab result, you will be contacted sooner.   If You Need Anything After Your Visit  If you have any questions or concerns for your doctor, please call our main line at [phone] and press option 4 to reach your doctor's medical assistant. If no one answers, please leave a voicemail as directed and we will return your call as soon as possible. Messages left after 4 pm will be answered the following business day.   You may also send us a message via MyChart. We typically respond to MyChart messages within 1-2 business days.  For prescription refills, please ask your pharmacy to contact our office. Our fax number is [phone].  If you have an urgent issue when the clinic is closed that cannot wait until the next business day, you can page your doctor at the number below.    Please note that while we do our best to be available for urgent issues outside of office hours, we are not available 24/7.   If you have an urgent issue and are unable to reach us, you may choose to seek medical care at your doctor's office, retail clinic, urgent care center, or emergency room.  If you have a medical emergency, please immediately call 911 or go to the  emergency department.  Pager Numbers  - Dr. Kowalski: [phone]  - Dr. Moye: [phone]  - Dr. Stewart: [phone]  In the event of inclement weather, please call our main line at [phone] for an update on the status of any delays or closures.  Dermatology Medication Tips: Please keep the boxes that topical medications come in in order to help keep track of the instructions about where and how to use these. Pharmacies typically print the medication instructions only on the boxes and not directly on the medication tubes.   If your medication is too expensive, please contact our office at [phone] option 4 or send us a message through MyChart.   We are unable to tell what your co-pay for medications will be in advance as this is different depending on your insurance coverage. However, we may be able to find a substitute medication at lower cost or fill out paperwork to get insurance to cover a needed medication.   If a prior authorization is required to get your medication covered by your insurance company, please allow us 1-2 business days to complete this process.  Drug prices often vary depending on where the prescription is filled and some pharmacies may offer cheaper prices.  The website www.goodrx.com contains coupons for medications through different pharmacies. The prices here do not account for what the cost may be with help from insurance (it may be cheaper with your insurance), but the website can   give you the price if you did not use any insurance.  - You can print the associated coupon and take it with your prescription to the pharmacy.  - You may also stop by our office during regular business hours and pick up a GoodRx coupon card.  - If you need your prescription sent electronically to a different pharmacy, notify our office through [place] MyChart or by phone at [phone] option 4.     Si Usted Necesita Algo Despus de Su Visita  Tambin puede  enviarnos un mensaje a travs de MyChart. Por lo general respondemos a los mensajes de MyChart en el transcurso de 1 a 2 das hbiles.  Para renovar recetas, por favor pida a su farmacia que se ponga en contacto con nuestra oficina. Nuestro nmero de fax es el [phone].  Si tiene un asunto urgente cuando la clnica est cerrada y que no puede esperar hasta el siguiente da hbil, puede llamar/localizar a su doctor(a) al nmero que aparece a continuacin.   Por favor, tenga en cuenta que aunque hacemos todo lo posible para estar disponibles para asuntos urgentes fuera del horario de oficina, no estamos disponibles las 24 horas del da, los 7 das de la semana.   Si tiene un problema urgente y no puede comunicarse con nosotros, puede optar por buscar atencin mdica  en el consultorio de su doctor(a), en una clnica privada, en un centro de atencin urgente o en una sala de emergencias.  Si tiene una emergencia mdica, por favor llame inmediatamente al 911 o vaya a la sala de emergencias.  Nmeros de bper  - Dr. Kowalski: [phone]  - Dra. Moye: [phone]  - Dra. Stewart: [phone]  En caso de inclemencias del tiempo, por favor llame a nuestra lnea principal al [phone] para una actualizacin sobre el estado de cualquier retraso o cierre.  Consejos para la medicacin en dermatologa: Por favor, guarde las cajas en las que vienen los medicamentos de uso tpico para ayudarle a seguir las instrucciones sobre dnde y cmo usarlos. Las farmacias generalmente imprimen las instrucciones del medicamento slo en las cajas y no directamente en los tubos del medicamento.   Si su medicamento es muy caro, por favor, pngase en contacto con nuestra oficina llamando al [phone] y presione la opcin 4 o envenos un mensaje a travs de MyChart.   No podemos decirle cul ser su copago por los medicamentos por adelantado ya que esto es diferente dependiendo de la cobertura de su seguro.  Sin embargo, es posible que podamos encontrar un medicamento sustituto a menor costo o llenar un formulario para que el seguro cubra el medicamento que se considera necesario.   Si se requiere una autorizacin previa para que su compaa de seguros cubra su medicamento, por favor permtanos de 1 a 2 das hbiles para completar este proceso.  Los precios de los medicamentos varan con frecuencia dependiendo del lugar de dnde se surte la receta y alguna farmacias pueden ofrecer precios ms baratos.  El sitio web www.goodrx.com tiene cupones para medicamentos de diferentes farmacias. Los precios aqu no tienen en cuenta lo que podra costar con la ayuda del seguro (puede ser ms barato con su seguro), pero el sitio web puede darle el precio si no utiliz ningn seguro.  - Puede imprimir el cupn correspondiente y llevarlo con su receta a la farmacia.  - Tambin puede pasar por nuestra oficina durante el horario de atencin regular y recoger una tarjeta de cupones de GoodRx.  -   Si necesita que su receta se enve electrnicamente a una farmacia diferente, informe a nuestra oficina a travs de MyChart de [place] o por telfono llamando al [phone] y presione la opcin 4.  

## 2022-10-14 NOTE — Progress Notes (Signed)
Follow-Up Visit   Subjective  Erika Ochoa is a 89 y.o. female who presents for the following: check face, arms, hx of SCC, SCCIS and AKs The patient has spots, moles and lesions to be evaluated, some may be new or changing and the patient may have concern these could be cancer.  The following portions of the chart were reviewed this encounter and updated as appropriate: medications, allergies, medical history  Review of Systems:  No other skin or systemic complaints except as noted in HPI or Assessment and Plan.  Objective  Well appearing patient in no apparent distress; mood and affect are within normal limits. A focused examination was performed of the following areas: Face, arms Relevant exam findings are noted in the Assessment and Plan.  L forehead x 1 Pink scaly macules   Assessment & Plan   HISTORY OF SQUAMOUS CELL CARCINOMA OF THE SKIN - No evidence of recurrence today - No lymphadenopathy - Recommend regular full body skin exams - Recommend daily broad spectrum sunscreen SPF 30+ to sun-exposed areas, reapply every 2 hours as needed.  - Call if any new or changing lesions are noted between office visits - L sup forehead, L cheek  HISTORY OF SQUAMOUS CELL CARCINOMA IN SITU OF THE SKIN - No evidence of recurrence today - Recommend regular full body skin exams - Recommend daily broad spectrum sunscreen SPF 30+ to sun-exposed areas, reapply every 2 hours as needed.  - Call if any new or changing lesions are noted between office visits  - L of midline med forehead, at hairline  AK (actinic keratosis) L forehead x 1  Destruction of lesion - L forehead x 1 Complexity: simple   Destruction method: cryotherapy   Informed consent: discussed and consent obtained   Timeout:  patient name, date of birth, surgical site, and procedure verified Lesion destroyed using liquid nitrogen: Yes   Region frozen until ice ball extended beyond lesion: Yes   Outcome: patient  tolerated procedure well with no complications   Post-procedure details: wound care instructions given    ACTINIC DAMAGE - chronic, secondary to cumulative UV radiation exposure/sun exposure over time - diffuse scaly erythematous macules with underlying dyspigmentation - Recommend daily broad spectrum sunscreen SPF 30+ to sun-exposed areas, reapply every 2 hours as needed.  - Recommend staying in the shade or wearing long sleeves, sun glasses (UVA+UVB protection) and wide brim hats (4-inch brim around the entire circumference of the hat). - Call for new or changing lesions.   RECENT ABSCESS, RESOLVING Exam: crusty area L cheek Treatment Plan: Start Mupirocin oint qd to L cheek until resolved  LENTIGINES Exam: scattered tan macules Due to sun exposure Treatment Plan: Benign-appearing, observe. Recommend daily broad spectrum sunscreen SPF 30+ to sun-exposed areas, reapply every 2 hours as needed.  Call for any changes  SEBORRHEIC KERATOSIS - Stuck-on, waxy, tan-brown papules and/or plaques  - Benign-appearing - Discussed benign etiology and prognosis. - Observe - Call for any changes  Purpura - Chronic; persistent and recurrent.  Treatable, but not curable. - Violaceous macules and patches - Benign - Related to trauma, age, sun damage and/or use of blood thinners, chronic use of topical and/or oral steroids - Observe - Can use OTC arnica containing moisturizer such as Dermend Bruise Formula if desired - Call for worsening or other concerns  - hands  Return if symptoms worsen or fail to improve.  I, Ardis Rowan, RMA, am acting as scribe for Armida Sans, MD .  Documentation: I have reviewed the above documentation for accuracy and completeness, and I agree with the above.  Sarina Ser, MD

## 2022-10-20 ENCOUNTER — Encounter: Payer: Self-pay | Admitting: Dermatology

## 2022-10-21 NOTE — Telephone Encounter (Signed)
Patient has decided not to move forward with [place]. She will call back if she changes her mind.

## 2022-11-21 ENCOUNTER — Other Ambulatory Visit: Payer: Self-pay | Admitting: Dermatology

## 2022-11-21 ENCOUNTER — Other Ambulatory Visit: Payer: Self-pay | Admitting: Radiation Oncology

## 2022-11-23 NOTE — Telephone Encounter (Signed)
Patient has completed treatment and no longer needs this medication.

## 2023-01-17 IMAGING — CT CT HEAD W/O CM
3 series · 15 of 47 positions shown, 18 images · non-contrast
Comparison: 12/19/2015

CLINICAL DATA: Severe headache.

EXAM:
CT HEAD WITHOUT CONTRAST
TECHNIQUE: Contiguous axial images were obtained from the base of the skull
through the vertex without intravenous contrast.

[Series 2: head wo · axial · 0.41mm/px · z∈[+446,+571]mm · 9 of 31 slices shown, 12 images]
[im 3/31  brain]
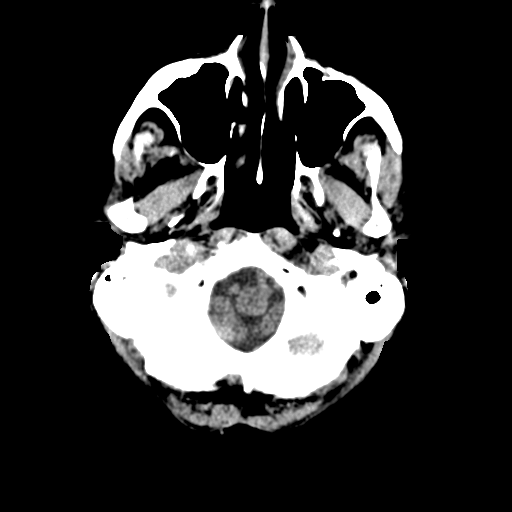
[im 3/31  bone]
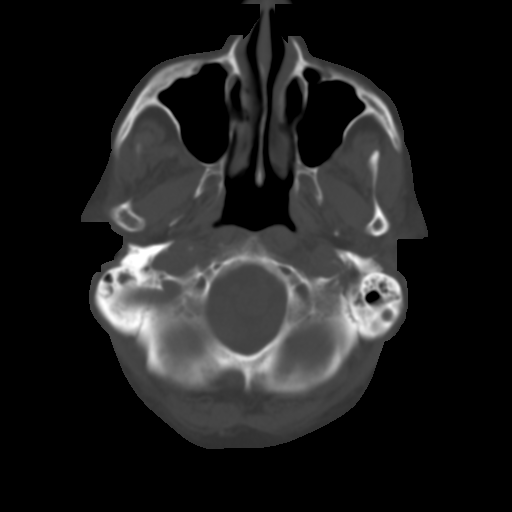
[im 6/31  brain]
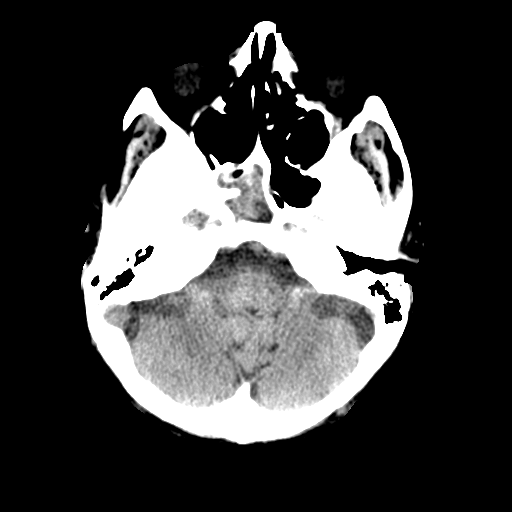
[im 9/31  brain]
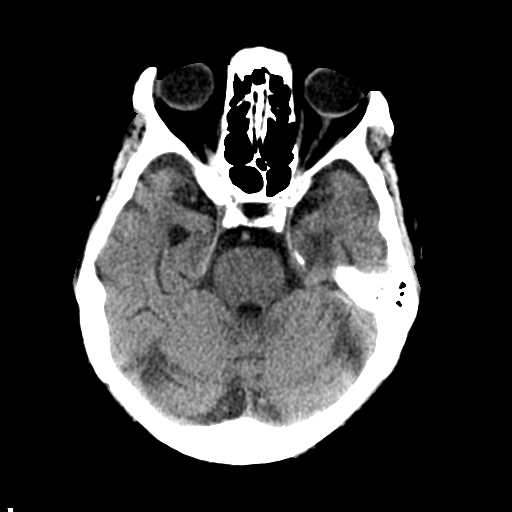
[im 12/31  brain]
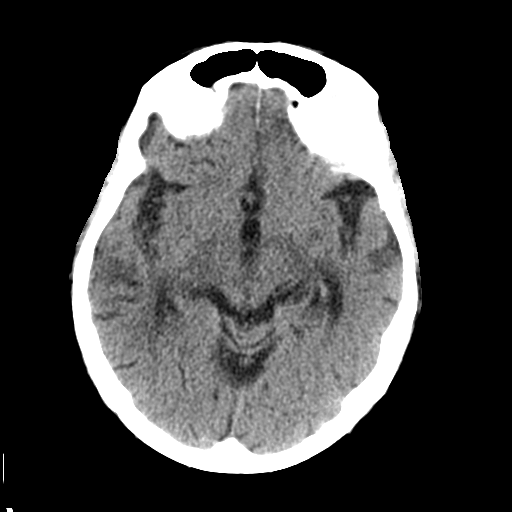
[im 16/31  brain]
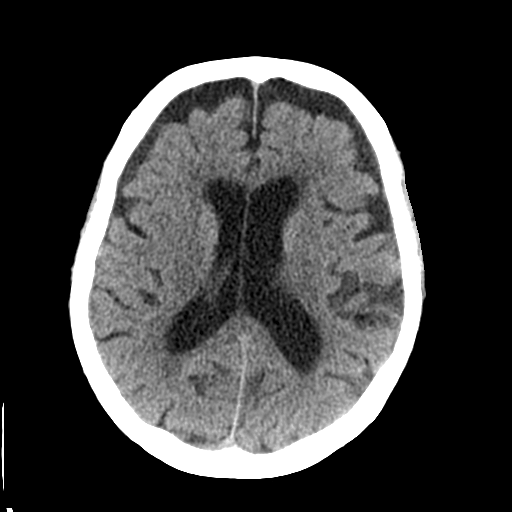
[im 16/31  bone]
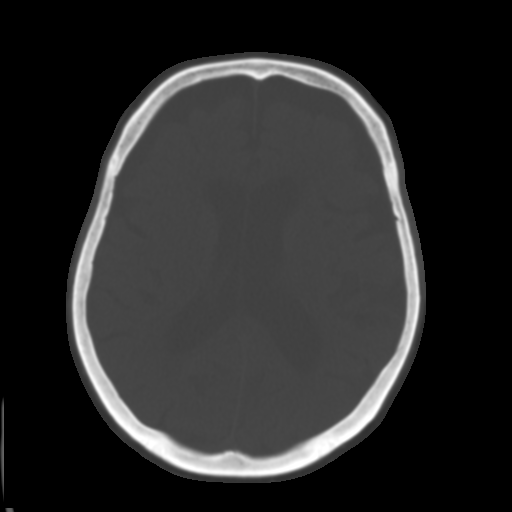
[im 19/31  brain]
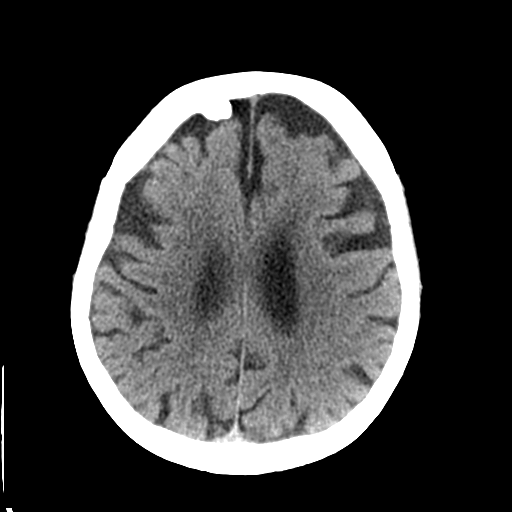
[im 22/31  brain]
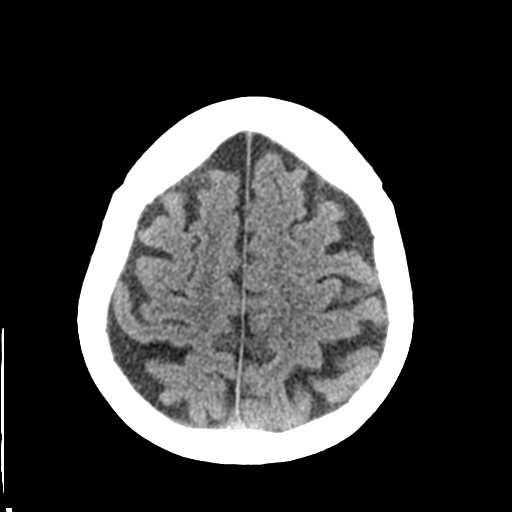
[im 25/31  brain]
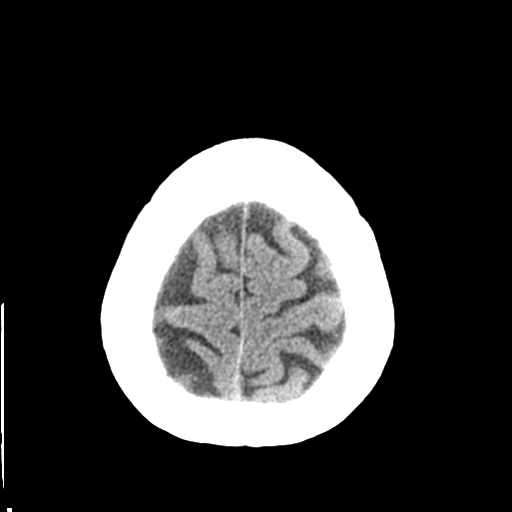
[im 28/31  brain]
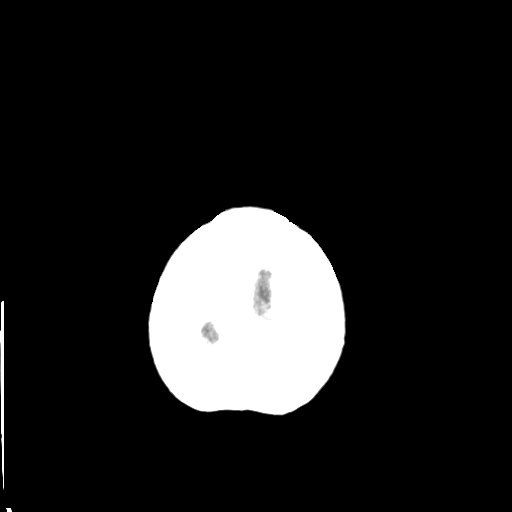
[im 28/31  bone]
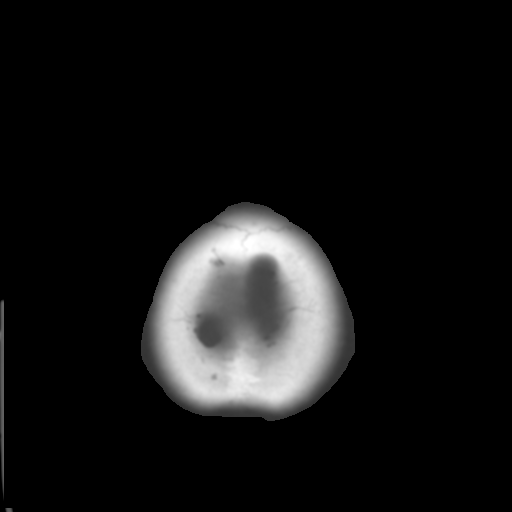

[Series 4: coronal soft tissue · coronal · 0.31mm/px · 3 of 70 slices shown]
[im 24/70  brain]
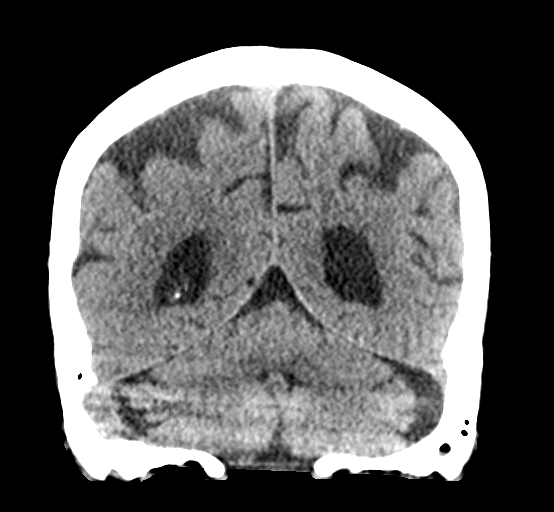
[im 31/70  brain]
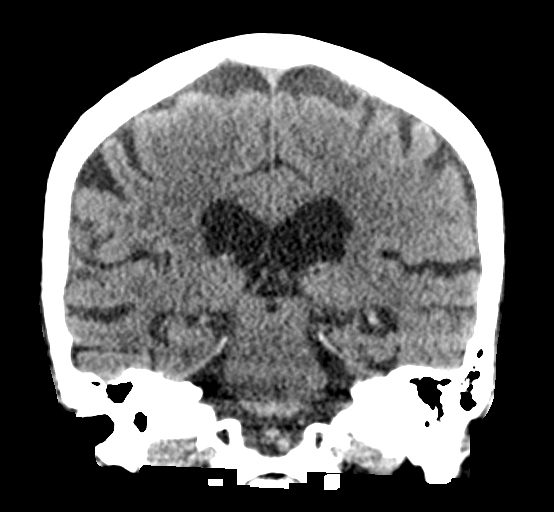
[im 39/70  brain]
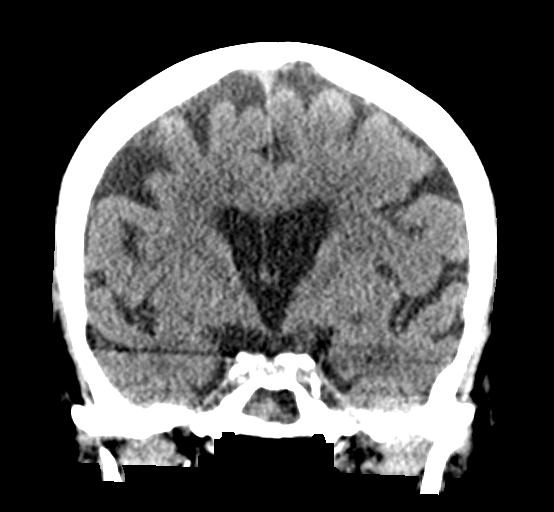

[Series 5: sagittal soft tissue · sagittal · 0.31mm/px · 3 of 60 slices shown]
[im 20/60  brain]
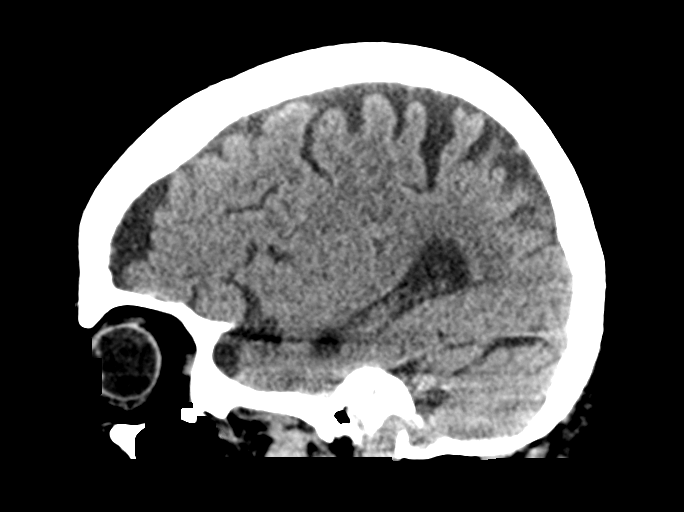
[im 30/60  brain]
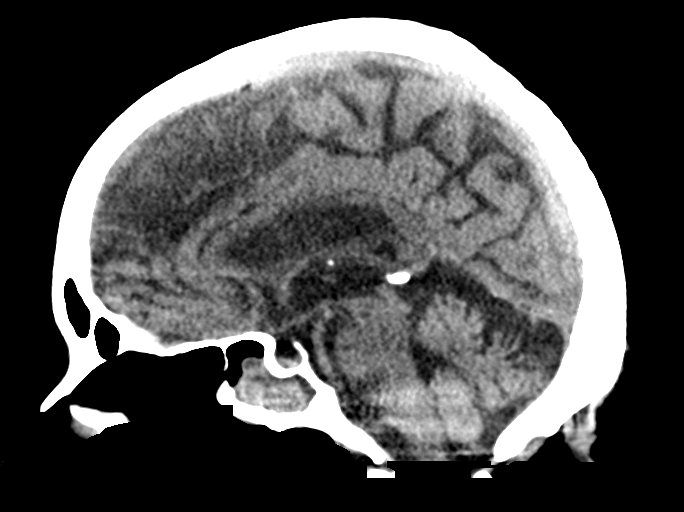
[im 40/60  brain]
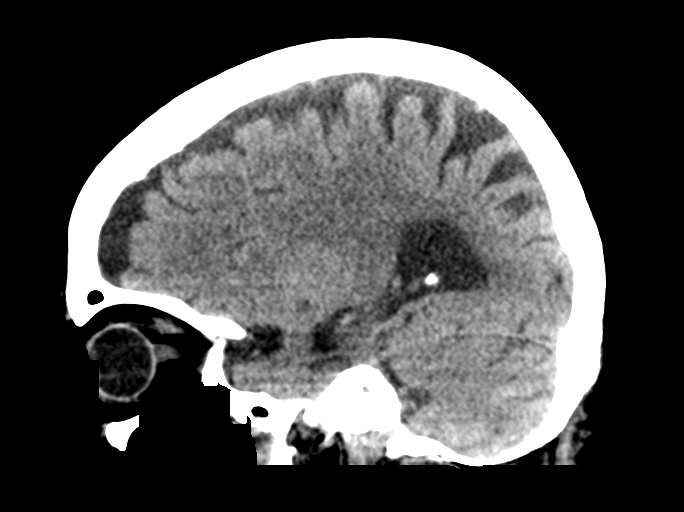

[15 of 47 positions shown; findings below may reference images not displayed]

FINDINGS: Brain: There is no evidence for acute hemorrhage, hydrocephalus,
mass lesion, or abnormal extra-axial fluid collection. No definite
CT evidence for acute infarction. Diffuse loss of parenchymal volume
is consistent with atrophy. Patchy low attenuation in the deep
hemispheric and periventricular white matter is nonspecific, but
likely reflects chronic microvascular ischemic demyelination.

Vascular: No hyperdense vessel or unexpected calcification.

Skull: No evidence for fracture. No worrisome lytic or sclerotic
lesion.

Sinuses/Orbits: Chronic opacification of the right sphenoid sinus
noted. Effusion noted inferior mastoid air cells bilaterally.
Visualized portions of the globes and intraorbital fat are
unremarkable.

Other: None.
IMPRESSION: 1. No acute intracranial abnormality.
2. Atrophy with chronic small vessel white matter ischemic disease.
3. Chronic right sphenoid sinusitis with bilateral inferior mastoid
effusions.

## 2023-02-11 ENCOUNTER — Other Ambulatory Visit: Payer: Self-pay | Admitting: Radiation Oncology

## 2023-03-24 ENCOUNTER — Emergency Department: Payer: Medicare HMO

## 2023-03-24 ENCOUNTER — Inpatient Hospital Stay
Admission: EM | Admit: 2023-03-24 | Discharge: 2023-03-26 | DRG: 194 | Disposition: A | Discharge status: Hospice | Payer: Medicare HMO | Attending: Osteopathic Medicine | Admitting: Osteopathic Medicine

## 2023-03-24 ENCOUNTER — Other Ambulatory Visit: Payer: Self-pay

## 2023-03-24 DIAGNOSIS — I2489 Other forms of acute ischemic heart disease: Secondary | ICD-10-CM | POA: Diagnosis present

## 2023-03-24 DIAGNOSIS — I2510 Atherosclerotic heart disease of native coronary artery without angina pectoris: Secondary | ICD-10-CM | POA: Insufficient documentation

## 2023-03-24 DIAGNOSIS — Z880 Allergy status to penicillin: Secondary | ICD-10-CM

## 2023-03-24 DIAGNOSIS — Z7902 Long term (current) use of antithrombotics/antiplatelets: Secondary | ICD-10-CM

## 2023-03-24 DIAGNOSIS — E871 Hypo-osmolality and hyponatremia: Secondary | ICD-10-CM | POA: Diagnosis present

## 2023-03-24 DIAGNOSIS — Z6821 Body mass index (BMI) 21.0-21.9, adult: Secondary | ICD-10-CM

## 2023-03-24 DIAGNOSIS — Z7982 Long term (current) use of aspirin: Secondary | ICD-10-CM

## 2023-03-24 DIAGNOSIS — Z85828 Personal history of other malignant neoplasm of skin: Secondary | ICD-10-CM

## 2023-03-24 DIAGNOSIS — I10 Essential (primary) hypertension: Secondary | ICD-10-CM | POA: Diagnosis present

## 2023-03-24 DIAGNOSIS — J189 Pneumonia, unspecified organism: Principal | ICD-10-CM | POA: Diagnosis present

## 2023-03-24 DIAGNOSIS — G8929 Other chronic pain: Secondary | ICD-10-CM | POA: Diagnosis present

## 2023-03-24 DIAGNOSIS — N390 Urinary tract infection, site not specified: Secondary | ICD-10-CM

## 2023-03-24 DIAGNOSIS — Z79890 Hormone replacement therapy (postmenopausal): Secondary | ICD-10-CM

## 2023-03-24 DIAGNOSIS — Z515 Encounter for palliative care: Secondary | ICD-10-CM

## 2023-03-24 DIAGNOSIS — Z7984 Long term (current) use of oral hypoglycemic drugs: Secondary | ICD-10-CM

## 2023-03-24 DIAGNOSIS — R531 Weakness: Principal | ICD-10-CM

## 2023-03-24 DIAGNOSIS — Z79899 Other long term (current) drug therapy: Secondary | ICD-10-CM

## 2023-03-24 DIAGNOSIS — F0390 Unspecified dementia without behavioral disturbance: Secondary | ICD-10-CM | POA: Diagnosis present

## 2023-03-24 DIAGNOSIS — R7989 Other specified abnormal findings of blood chemistry: Secondary | ICD-10-CM | POA: Diagnosis present

## 2023-03-24 DIAGNOSIS — Z885 Allergy status to narcotic agent status: Secondary | ICD-10-CM

## 2023-03-24 DIAGNOSIS — R627 Adult failure to thrive: Secondary | ICD-10-CM | POA: Diagnosis present

## 2023-03-24 DIAGNOSIS — Z66 Do not resuscitate: Secondary | ICD-10-CM | POA: Diagnosis present

## 2023-03-24 DIAGNOSIS — M79601 Pain in right arm: Secondary | ICD-10-CM | POA: Diagnosis present

## 2023-03-24 DIAGNOSIS — Z85818 Personal history of malignant neoplasm of other sites of lip, oral cavity, and pharynx: Secondary | ICD-10-CM

## 2023-03-24 DIAGNOSIS — Z888 Allergy status to other drugs, medicaments and biological substances status: Secondary | ICD-10-CM

## 2023-03-24 DIAGNOSIS — Z794 Long term (current) use of insulin: Secondary | ICD-10-CM

## 2023-03-24 DIAGNOSIS — Z881 Allergy status to other antibiotic agents status: Secondary | ICD-10-CM

## 2023-03-24 DIAGNOSIS — Z882 Allergy status to sulfonamides status: Secondary | ICD-10-CM

## 2023-03-24 DIAGNOSIS — E785 Hyperlipidemia, unspecified: Secondary | ICD-10-CM | POA: Insufficient documentation

## 2023-03-24 DIAGNOSIS — I480 Paroxysmal atrial fibrillation: Secondary | ICD-10-CM | POA: Diagnosis present

## 2023-03-24 DIAGNOSIS — E119 Type 2 diabetes mellitus without complications: Secondary | ICD-10-CM

## 2023-03-24 DIAGNOSIS — K219 Gastro-esophageal reflux disease without esophagitis: Secondary | ICD-10-CM | POA: Insufficient documentation

## 2023-03-24 DIAGNOSIS — Z809 Family history of malignant neoplasm, unspecified: Secondary | ICD-10-CM

## 2023-03-24 DIAGNOSIS — E039 Hypothyroidism, unspecified: Secondary | ICD-10-CM | POA: Diagnosis present

## 2023-03-24 LAB — CBC
HCT: 36 % (ref 36.0–46.0)
Hemoglobin: 11.8 g/dL — ABNORMAL LOW (ref 12.0–15.0)
MCH: 30.2 pg (ref 26.0–34.0)
MCHC: 32.8 g/dL (ref 30.0–36.0)
MCV: 92.1 fL (ref 80.0–100.0)
Platelets: 300 10*3/uL (ref 150–400)
RBC: 3.91 MIL/uL (ref 3.87–5.11)
RDW: 17 % — ABNORMAL HIGH (ref 11.5–15.5)
WBC: 9.5 10*3/uL (ref 4.0–10.5)
nRBC: 0 % (ref 0.0–0.2)

## 2023-03-24 MED ORDER — FENTANYL CITRATE PF 50 MCG/ML IJ SOSY
50.0000 ug | PREFILLED_SYRINGE | Freq: Once | INTRAMUSCULAR | Status: AC
  Administered 2023-03-24: 50 ug via INTRAVENOUS
  Filled 2023-03-24: qty 1

## 2023-03-24 NOTE — ED Provider Notes (Signed)
[facility] Provider Note    Event Date/Time   First MD Initiated Contact with Patient 03/24/23 2037     (approximate)   History   Weakness   HPI  Erika Ochoa is a 89 y.o. female who presents to the emergency department today accompanied by family because of concerns for weakness as well as right shoulder and neck pain.  Family states that the patient has been dealing with right shoulder and neck pain for some time.  They state primary care did get imaging of the shoulder which did not show any concerning findings.  The patient's weakness has gotten worse over the past 2 days.  Patient does have history of dementia as well as cancer although it has been a significant change recently.  They deny any fevers.  They have not appreciated any increased cough.     Physical Exam   Triage Vital Signs: ED Triage Vitals  Encounter Vitals Group     BP 03/24/23 1927 (!) 156/93     Systolic BP Percentile --      Diastolic BP Percentile --      Pulse Rate 03/24/23 1927 99     Resp 03/24/23 1927 19     Temp 03/24/23 1927 98.2 F (36.8 C)     Temp src --      SpO2 03/24/23 1927 97 %     Weight --      Height --      Head Circumference --      Peak Flow --      Pain Score 03/24/23 1928 0     Pain Loc --      Pain Education --      Exclude from Growth Chart --     Most recent vital signs: Vitals:   03/24/23 1927  BP: (!) 156/93  Pulse: 99  Resp: 19  Temp: 98.2 F (36.8 C)  SpO2: 97%   General: Awake, alert, oriented. CV:  Good peripheral perfusion. Regular rate and rhythm. Resp:  Normal effort. Lungs clear. Abd:  No distention.  Other:  Slight tenderness to palpation of neck and right trap.   ED Results / Procedures / Treatments   Labs (all labs ordered are listed, but only abnormal results are displayed) Labs Reviewed  CBC - Abnormal; Notable for the following components:      Result Value   Hemoglobin 11.8 (*)    RDW 17.0 (*)    All  other components within normal limits  URINALYSIS, ROUTINE W REFLEX MICROSCOPIC  BASIC METABOLIC PANEL  TROPONIN I (HIGH SENSITIVITY)     EKG  I, Phineas Semen, attending physician, personally viewed and interpreted this EKG  EKG Time: 1936 Rate: 98 Rhythm: sinus rhythm with PVC Axis: normal Intervals: qtc 485 QRS: incomplete RBBB ST changes: no st elevation Impression: abnormal ekg  RADIOLOGY Awaiting imaging results at time of sign out   PROCEDURES:  Critical Care performed: No    MEDICATIONS ORDERED IN ED: Medications - No data to display   IMPRESSION / MDM / ASSESSMENT AND PLAN / ED COURSE  I reviewed the triage vital signs and the nursing notes.                              Differential diagnosis includes, but is not limited to, infection, anemia, dehydration, pathologic fracture, cervical radiculopathy  Patient's presentation is most consistent with acute presentation with potential  threat to life or bodily function.   The patient is on the cardiac monitor to evaluate for evidence of arrhythmia and/or significant heart rate changes.  Patient presented to the emergency department today with concerns for both weakness as well as right shoulder and neck pain.  On exam patient is awake and alert.  Did have some tenderness to palpation of the right neck and trap.  Will check blood work to evaluate for possible infection, anemia electrolyte abnormality.  Will get CT scan of the neck to evaluate for any abnormalities.  Imaging pending at time of signout. Majority of blood work and UA pending at time of signout.       FINAL CLINICAL IMPRESSION(S) / ED DIAGNOSES   Weakness    Note:  This document was prepared using Dragon voice recognition software and may include unintentional dictation errors.    Phineas Semen, MD 03/25/23 [phone]

## 2023-03-24 NOTE — ED Triage Notes (Signed)
Pt to ED via [facility] c/o weakness x1week. Per grandson(POA), pt has been weaker than usual. Pt has been sleeping more and eating less. Pt also has been complaining of right arm pain, no known injury. Pt has hx of dementia.

## 2023-03-25 ENCOUNTER — Inpatient Hospital Stay: Payer: Medicare HMO

## 2023-03-25 DIAGNOSIS — R531 Weakness: Secondary | ICD-10-CM | POA: Diagnosis not present

## 2023-03-25 DIAGNOSIS — R7989 Other specified abnormal findings of blood chemistry: Secondary | ICD-10-CM

## 2023-03-25 DIAGNOSIS — M79601 Pain in right arm: Secondary | ICD-10-CM | POA: Diagnosis present

## 2023-03-25 DIAGNOSIS — K219 Gastro-esophageal reflux disease without esophagitis: Secondary | ICD-10-CM | POA: Insufficient documentation

## 2023-03-25 DIAGNOSIS — E039 Hypothyroidism, unspecified: Secondary | ICD-10-CM | POA: Diagnosis present

## 2023-03-25 DIAGNOSIS — Z885 Allergy status to narcotic agent status: Secondary | ICD-10-CM | POA: Diagnosis not present

## 2023-03-25 DIAGNOSIS — Z85818 Personal history of malignant neoplasm of other sites of lip, oral cavity, and pharynx: Secondary | ICD-10-CM | POA: Diagnosis not present

## 2023-03-25 DIAGNOSIS — Z79890 Hormone replacement therapy (postmenopausal): Secondary | ICD-10-CM | POA: Diagnosis not present

## 2023-03-25 DIAGNOSIS — E119 Type 2 diabetes mellitus without complications: Secondary | ICD-10-CM

## 2023-03-25 DIAGNOSIS — Z515 Encounter for palliative care: Secondary | ICD-10-CM | POA: Diagnosis not present

## 2023-03-25 DIAGNOSIS — Z85828 Personal history of other malignant neoplasm of skin: Secondary | ICD-10-CM | POA: Diagnosis not present

## 2023-03-25 DIAGNOSIS — F0390 Unspecified dementia without behavioral disturbance: Secondary | ICD-10-CM | POA: Diagnosis present

## 2023-03-25 DIAGNOSIS — J189 Pneumonia, unspecified organism: Secondary | ICD-10-CM | POA: Diagnosis present

## 2023-03-25 DIAGNOSIS — I10 Essential (primary) hypertension: Secondary | ICD-10-CM | POA: Diagnosis present

## 2023-03-25 DIAGNOSIS — E785 Hyperlipidemia, unspecified: Secondary | ICD-10-CM | POA: Diagnosis present

## 2023-03-25 DIAGNOSIS — I2510 Atherosclerotic heart disease of native coronary artery without angina pectoris: Secondary | ICD-10-CM

## 2023-03-25 DIAGNOSIS — I2489 Other forms of acute ischemic heart disease: Secondary | ICD-10-CM | POA: Diagnosis present

## 2023-03-25 DIAGNOSIS — Z880 Allergy status to penicillin: Secondary | ICD-10-CM | POA: Diagnosis not present

## 2023-03-25 DIAGNOSIS — Z794 Long term (current) use of insulin: Secondary | ICD-10-CM | POA: Diagnosis not present

## 2023-03-25 DIAGNOSIS — G8929 Other chronic pain: Secondary | ICD-10-CM | POA: Diagnosis present

## 2023-03-25 DIAGNOSIS — I480 Paroxysmal atrial fibrillation: Secondary | ICD-10-CM | POA: Diagnosis present

## 2023-03-25 DIAGNOSIS — N390 Urinary tract infection, site not specified: Secondary | ICD-10-CM

## 2023-03-25 DIAGNOSIS — Z7984 Long term (current) use of oral hypoglycemic drugs: Secondary | ICD-10-CM | POA: Diagnosis not present

## 2023-03-25 DIAGNOSIS — R627 Adult failure to thrive: Secondary | ICD-10-CM | POA: Diagnosis present

## 2023-03-25 DIAGNOSIS — E871 Hypo-osmolality and hyponatremia: Secondary | ICD-10-CM | POA: Diagnosis present

## 2023-03-25 DIAGNOSIS — Z66 Do not resuscitate: Secondary | ICD-10-CM | POA: Diagnosis present

## 2023-03-25 LAB — URINALYSIS, ROUTINE W REFLEX MICROSCOPIC
Bilirubin Urine: NEGATIVE
Glucose, UA: NEGATIVE mg/dL
Hgb urine dipstick: NEGATIVE
Ketones, ur: 5 mg/dL — AB
Leukocytes,Ua: NEGATIVE
Nitrite: POSITIVE — AB
Protein, ur: NEGATIVE mg/dL
Specific Gravity, Urine: 1.027 (ref 1.005–1.030)
Squamous Epithelial / HPF: 0 /[HPF] (ref 0–5)
pH: 5 (ref 5.0–8.0)

## 2023-03-25 LAB — CBC
HCT: 34.7 % — ABNORMAL LOW (ref 36.0–46.0)
Hemoglobin: 11.2 g/dL — ABNORMAL LOW (ref 12.0–15.0)
MCH: 30.2 pg (ref 26.0–34.0)
MCHC: 32.3 g/dL (ref 30.0–36.0)
MCV: 93.5 fL (ref 80.0–100.0)
Platelets: 276 10*3/uL (ref 150–400)
RBC: 3.71 MIL/uL — ABNORMAL LOW (ref 3.87–5.11)
RDW: 17 % — ABNORMAL HIGH (ref 11.5–15.5)
WBC: 10.9 10*3/uL — ABNORMAL HIGH (ref 4.0–10.5)
nRBC: 0 % (ref 0.0–0.2)

## 2023-03-25 LAB — BASIC METABOLIC PANEL
Anion gap: 12 (ref 5–15)
Anion gap: 9 (ref 5–15)
BUN: 22 mg/dL (ref 8–23)
BUN: 23 mg/dL (ref 8–23)
CO2: 19 mmol/L — ABNORMAL LOW (ref 22–32)
CO2: 20 mmol/L — ABNORMAL LOW (ref 22–32)
Calcium: 8.9 mg/dL (ref 8.9–10.3)
Calcium: 9.1 mg/dL (ref 8.9–10.3)
Chloride: 105 mmol/L (ref 98–111)
Chloride: 105 mmol/L (ref 98–111)
Creatinine, Ser: 1.01 mg/dL — ABNORMAL HIGH (ref 0.44–1.00)
Creatinine, Ser: 1.05 mg/dL — ABNORMAL HIGH (ref 0.44–1.00)
GFR, Estimated: 49 mL/min — ABNORMAL LOW (ref >60)
GFR, Estimated: 51 mL/min — ABNORMAL LOW (ref >60)
Glucose, Bld: 103 mg/dL — ABNORMAL HIGH (ref 70–99)
Glucose, Bld: 113 mg/dL — ABNORMAL HIGH (ref 70–99)
Potassium: 3.7 mmol/L (ref 3.5–5.1)
Potassium: 3.8 mmol/L (ref 3.5–5.1)
Sodium: 134 mmol/L — ABNORMAL LOW (ref 135–145)
Sodium: 136 mmol/L (ref 135–145)

## 2023-03-25 LAB — TROPONIN I (HIGH SENSITIVITY)
Troponin I (High Sensitivity): 56 ng/L — ABNORMAL HIGH (ref <18)
Troponin I (High Sensitivity): 56 ng/L — ABNORMAL HIGH (ref <18)
Troponin I (High Sensitivity): 67 ng/L — ABNORMAL HIGH (ref <18)
Troponin I (High Sensitivity): 67 ng/L — ABNORMAL HIGH (ref <18)

## 2023-03-25 MED ORDER — GUAIFENESIN ER 600 MG PO TB12
600.0000 mg | ORAL_TABLET | Freq: Two times a day (BID) | ORAL | Status: DC
  Administered 2023-03-25 – 2023-03-26 (×3): 600 mg via ORAL
  Filled 2023-03-25 (×3): qty 1

## 2023-03-25 MED ORDER — ENOXAPARIN SODIUM 30 MG/0.3ML IJ SOSY
30.0000 mg | PREFILLED_SYRINGE | INTRAMUSCULAR | Status: DC
  Administered 2023-03-25 – 2023-03-26 (×2): 30 mg via SUBCUTANEOUS
  Filled 2023-03-25 (×2): qty 0.3

## 2023-03-25 MED ORDER — IPRATROPIUM-ALBUTEROL 0.5-2.5 (3) MG/3ML IN SOLN: 3.0000 mL | RESPIRATORY_TRACT | Status: DC | PRN

## 2023-03-25 MED ORDER — TRAZODONE HCL 50 MG PO TABS: 25.0000 mg | ORAL_TABLET | Freq: Every evening | ORAL | Status: DC | PRN

## 2023-03-25 MED ORDER — HYDROCODONE-ACETAMINOPHEN 5-325 MG PO TABS
1.0000 | ORAL_TABLET | ORAL | Status: DC | PRN
  Administered 2023-03-25 – 2023-03-26 (×4): 2 via ORAL
  Filled 2023-03-25 (×4): qty 2

## 2023-03-25 MED ORDER — IPRATROPIUM-ALBUTEROL 0.5-2.5 (3) MG/3ML IN SOLN
3.0000 mL | Freq: Four times a day (QID) | RESPIRATORY_TRACT | Status: DC
  Administered 2023-03-25 – 2023-03-26 (×3): 3 mL via RESPIRATORY_TRACT
  Filled 2023-03-25 (×5): qty 3

## 2023-03-25 MED ORDER — SODIUM CHLORIDE 0.9 % IV SOLN
2.0000 g | INTRAVENOUS | Status: DC
  Administered 2023-03-25 – 2023-03-26 (×2): 2 g via INTRAVENOUS
  Filled 2023-03-25 (×3): qty 20

## 2023-03-25 MED ORDER — ONDANSETRON HCL 4 MG PO TABS: 4.0000 mg | ORAL_TABLET | Freq: Four times a day (QID) | ORAL | Status: DC | PRN

## 2023-03-25 MED ORDER — ACETAMINOPHEN 650 MG RE SUPP: 650.0000 mg | Freq: Four times a day (QID) | RECTAL | Status: DC | PRN

## 2023-03-25 MED ORDER — SODIUM CHLORIDE 0.9 % IV SOLN
500.0000 mg | INTRAVENOUS | Status: DC
  Administered 2023-03-25 – 2023-03-26 (×2): 500 mg via INTRAVENOUS
  Filled 2023-03-25 (×3): qty 5

## 2023-03-25 MED ORDER — HYDROCOD POLI-CHLORPHE POLI ER 10-8 MG/5ML PO SUER: 5.0000 mL | Freq: Two times a day (BID) | ORAL | Status: DC | PRN

## 2023-03-25 MED ORDER — ONDANSETRON HCL 4 MG/2ML IJ SOLN: 4.0000 mg | Freq: Four times a day (QID) | INTRAMUSCULAR | Status: DC | PRN

## 2023-03-25 MED ORDER — ACETAMINOPHEN 325 MG PO TABS: 650.0000 mg | ORAL_TABLET | Freq: Four times a day (QID) | ORAL | Status: DC | PRN

## 2023-03-25 MED ORDER — FENTANYL CITRATE PF 50 MCG/ML IJ SOSY
12.5000 ug | PREFILLED_SYRINGE | INTRAMUSCULAR | Status: DC | PRN
  Administered 2023-03-25 (×2): 12.5 ug via INTRAVENOUS
  Filled 2023-03-25 (×2): qty 1

## 2023-03-25 MED ORDER — SODIUM CHLORIDE 0.9 % IV SOLN: 500.0000 mg | Freq: Once | INTRAVENOUS | Status: DC

## 2023-03-25 MED ORDER — KCL IN DEXTROSE-NACL 10-5-0.45 MEQ/L-%-% IV SOLN
INTRAVENOUS | Status: DC
  Filled 2023-03-25: qty 1000

## 2023-03-25 MED ORDER — MAGNESIUM HYDROXIDE 400 MG/5ML PO SUSP: 30.0000 mL | Freq: Every day | ORAL | Status: DC | PRN

## 2023-03-25 MED ORDER — SODIUM CHLORIDE 0.9 % IV SOLN: 2.0000 g | Freq: Once | INTRAVENOUS | Status: DC

## 2023-03-25 NOTE — Assessment & Plan Note (Signed)
-   This is likely secondary to #1 and #2. - Management as above. - PT consult to be obtained.

## 2023-03-25 NOTE — Progress Notes (Signed)
PHARMACIST - PHYSICIAN COMMUNICATION  CONCERNING:  Enoxaparin (Lovenox) for DVT Prophylaxis    RECOMMENDATION: Patient was prescribed enoxaprin 40mg  q24 hours for VTE prophylaxis.   Filed Weights   03/25/23 0151  Weight: 59.4 kg (131 lb)    Body mass index is 21.14 kg/m.  Estimated Creatinine Clearance: 29.3 mL/min (A) (by C-G formula based on SCr of 1.05 mg/dL (H)).  Patient is candidate for enoxaparin 30mg  every 24 hours based on CrCl <54ml/min or Weight <45kg  DESCRIPTION: Pharmacy has adjusted enoxaparin dose per [facility] policy.  Patient is now receiving enoxaparin 30 mg every 24 hours   Otelia Sergeant, PharmD, MBA 03/25/2023 2:00 AM

## 2023-03-25 NOTE — Assessment & Plan Note (Signed)
-  We will continue statin therapy.

## 2023-03-25 NOTE — ED Notes (Signed)
RN and tech put pt on bedpan. After several minutes pt was not able to void. Pt taken off  of bedpan, pulled up in bed, and repositioned for comfort.

## 2023-03-25 NOTE — Assessment & Plan Note (Signed)
-  We will continue aspirin and Plavix.

## 2023-03-25 NOTE — Assessment & Plan Note (Signed)
-   This is like secondary to demand ischemia. - We will follow serial troponins. - She had no chest pain.

## 2023-03-25 NOTE — Progress Notes (Addendum)
Patient's Healthcare-POA, Trey Paula, told this RN that he does not want the patient woken up for midnight vitals so she can rest.   POA also told this RN that he would like to discuss making this patient comfort care. He states he will speak with the day time Physician regarding comfort care in the morning.   RN notified Dr. Arville Care of the above information.

## 2023-03-25 NOTE — Progress Notes (Signed)
[facility] REFERRAL NOTE  Received request from Allena Katz, SW/Transitions of Care Manager, for hospice services at home after discharge from [facility]. Spoke with Angelina Ok, patient's Grandson, to initiate education related to hospice philosophy, services, and team approach to care. Patient/family verbalized understanding of information given. Per discussion, the plan is for discharge home probably tomorrow, per Dr. Lyn Hollingshead  DME needs discussed. Patient has the following equipment in the home:  cane  Patient/family requests the following equipment for delivery:  hospice admission nurse will assess dme needs after home evaluation.  No immediate DME needs that would hold up patient's discharge.  The address and phone numbers have been verified and are correct in the chart.  Dois Davenport  [place] is the family contact to arrange time of assessment visit.  Please send signed and completed DNR home with patient/family if applicable. Please provide prescriptions at discharge as needed to ensure ongoing symptom management.  [facility] information and contact numbers given to   Above information shared with Allena Katz, SW and patient's medical care team.  Please call with any hospice related questions or concerns. Thank you for the opportunity to participate in this patient's care.  Norris Cross, RN Nurse Liaison [phone]

## 2023-03-25 NOTE — H&P (Addendum)
Elevated     [place]   PATIENT NAME: Dim Cardi    MR#:  [id number]  DATE OF BIRTH:  1924/09/25  DATE OF ADMISSION:  03/24/2023  PRIMARY CARE PHYSICIAN: Jerl Mina, MD   Patient is coming from: Home  REQUESTING/REFERRING PHYSICIAN: Ward, Layla Maw, DO  CHIEF COMPLAINT:   Chief Complaint  Patient presents with   Weakness    HISTORY OF PRESENT ILLNESS:  Crystol L Fogal is a 89 y.o. Caucasian female with medical history significant for type II diabetes mellitus and hypertension, who presented to the emergency room with acute onset of generalized weakness and diminished p.o. appetite as well as increased sleepiness over the last week.  The patient has been having right shoulder and neck pain.  Her PCP imaging of the shoulder did not show any concerning findings.  No fever or chills.  No nausea or vomiting or abdominal pain.   ED Course: Labs revealed mild hyponatremia 134 and CO2 of 20 with high sensitive troponin of 67.  CBC showed hemoglobin 11.8 hematocrit 36 compared to 11.4 and 34.8 on 09/28/2022.  UA was positive for UTI EKG as reviewed by me : 99 admitted inpatient EKG showed sinus rhythm with rate of 98 with premature supraventricular complexes.  It showed incomplete right bundle branch block and T wave inversion in V1 through V3. Imaging: C-spine CT without contrast revealed the following: 1. No acute fracture or traumatic subluxation of the cervical spine. 2. Bilateral cavitary lesions in the upper lungs have increased in size and number compared to the PET-CT in September 28, 2022.  The largest measures up to 4.2 cm.  Portable chest x-ray showed minimal patchy opacities in the perihilar lesions, left greater than right that could represent infectious or inflammatory etiology.  The patient was given IV Rocephin and Zithromax as well as 50 mcg of IV fentanyl.  She will be admitted to a medical telemetry bed for further evaluation and management. PAST MEDICAL HISTORY:    Past Medical History:  Diagnosis Date   Abnormal mammogram, unspecified    Actinic keratosis    Breast screening, unspecified    Diabetes (HCC)    Hypertension    Mammographic microcalcification    Squamous cell carcinoma of skin 06/14/2019   Left superior forehead. Well differentiated. Tx: EDC   Squamous cell carcinoma of skin 09/13/2019   L of midline med forehead at hairline SCCIS    Squamous cell carcinoma of skin 04/23/2021   Left cheek, recurrent on 06/23/21 tx with [facility] again   Vaginal pessary present     PAST SURGICAL HISTORY:   Past Surgical History:  Procedure Laterality Date   BREAST BIOPSY Left 2013   stereo calcs   BREAST EXCISIONAL BIOPSY Left    "years ago"-benign   ESOPHAGOGASTRODUODENOSCOPY Left 06/12/2018   Procedure: ESOPHAGOGASTRODUODENOSCOPY (EGD);  Surgeon: Pasty Spillers, MD;  Location: [facility] ENDOSCOPY;  Service: Endoscopy;  Laterality: Left;   ESOPHAGOGASTRODUODENOSCOPY N/A 08/31/2019   Procedure: ESOPHAGOGASTRODUODENOSCOPY (EGD);  Surgeon: Toledo, Boykin Nearing, MD;  Location: [facility] ENDOSCOPY;  Service: Gastroenterology;  Laterality: N/A;   SKIN LESION EXCISION      SOCIAL HISTORY:   Social History   Tobacco Use   Smoking status: Never   Smokeless tobacco: Never  Substance Use Topics   Alcohol use: No    Alcohol/week: 0.0 standard drinks of alcohol    FAMILY HISTORY:   Family History  Problem Relation Age of Onset   Cancer Mother    Breast cancer  Neg Hx     DRUG ALLERGIES:   Allergies  Allergen Reactions   Augmentin [Amoxicillin-Pot Clavulanate] Swelling    Facial and lip swelling 05/29/22   Oxycodone Hcl Other (See Comments)    Other Reaction: GI Upset Other reaction(s): Other (See Comments) Other Reaction: GI Upset    Oxycodone-Aspirin Other (See Comments)    Other Reaction: GI Upset (Per pt she does take aspirin 81 mg daily   Codeine Nausea Only   Etodolac Other (See Comments)   Iodine Nausea Only   Levaquin [Levofloxacin  In D5w] Nausea Only   Levofloxacin Nausea Only   Metronidazole Nausea Only   Ultram [Tramadol] Nausea Only   Sulfa Antibiotics Nausea Only and Other (See Comments)    Other reaction(s): Unknown     REVIEW OF SYSTEMS:   ROS As per history of present illness. All pertinent systems were reviewed above. Constitutional, HEENT, cardiovascular, respiratory, GI, GU, musculoskeletal, neuro, psychiatric, endocrine, integumentary and hematologic systems were reviewed and are otherwise negative/unremarkable except for positive findings mentioned above in the HPI.   MEDICATIONS AT HOME:   Prior to Admission medications   Medication Sig Start Date End Date Taking? Authorizing Provider  aspirin EC 81 MG EC tablet Take 1 tablet (81 mg total) by mouth daily. Swallow whole. 09/11/20  Yes Wieting, Richard, MD  cyanocobalamin 1000 MCG tablet Take 1,000 mcg by mouth daily.   Yes [provider]  ferrous sulfate 325 (65 FE) MG EC tablet Take 1 tablet (325 mg total) by mouth 2 (two) times daily. Patient taking differently: Take 650 mg by mouth as directed. Take 2 tablets by mouth on Monday and Friday. 08/31/19 03/25/23 Yes Arnetha Courser, MD  glipiZIDE (GLUCOTROL XL) 5 MG 24 hr tablet Take 5 mg by mouth daily with breakfast. 07/01/18  Yes [provider]  rosuvastatin (CRESTOR) 5 MG tablet Take 5 mg by mouth daily. 01/08/23  Yes [provider]  Vitamin D, Ergocalciferol, (DRISDOL) 1.25 MG (50000 UT) CAPS capsule Take 50,000 Units by mouth every Saturday.  08/04/18  Yes [provider]  clopidogrel (PLAVIX) 75 MG tablet Take 1 tablet (75 mg total) by mouth daily. Patient not taking: Reported on 03/25/2023 09/11/20   Alford Highland, MD  diltiazem (CARDIZEM CD) 120 MG 24 hr capsule Take 1 capsule (120 mg total) by mouth daily. Patient not taking: Reported on 03/25/2023 09/11/20   Alford Highland, MD  donepezil (ARICEPT) 5 MG tablet Take 1 tablet by mouth every evening. Patient not  taking: Reported on 10/14/2022 07/20/20   [provider]  irbesartan (AVAPRO) 300 MG tablet Take by mouth. 09/24/22 09/24/23  [provider]  isosorbide mononitrate (IMDUR) 30 MG 24 hr tablet Take 1 tablet (30 mg total) by mouth daily. 09/11/20   Alford Highland, MD  LANTUS 100 UNIT/ML injection Inject into the skin. Patient not taking: Reported on 10/14/2022    [provider]  LEVEMIR FLEXPEN 100 UNIT/ML FlexPen Inject into the skin. 11/18/20   [provider]  levothyroxine (SYNTHROID, LEVOTHROID) 50 MCG tablet Take 50 mcg by mouth daily.     [provider]  Magnesium Oxide (MAG-OXIDE) 200 MG TABS Take 2 tablets (400 mg total) by mouth 2 (two) times daily. 09/18/20   Cuthriell, Delorise Royals, PA-C  metoprolol tartrate (LOPRESSOR) 50 MG tablet Take 1 tablet (50 mg total) by mouth 2 (two) times daily. 09/10/20   Alford Highland, MD  mupirocin ointment (BACTROBAN) 2 % APPLY 1 APPLICATION TOPICALLY DAILY TO LEFT  CHEEK UNTIL RESOLVED 11/23/22   Deirdre Evener, MD  nitroGLYCERIN (NITROSTAT) 0.4 MG SL tablet Place 1 tablet (0.4 mg total) under the tongue every 5 (five) minutes x 3 doses as needed for chest pain (max 3). 09/10/20   Alford Highland, MD  pantoprazole (PROTONIX) 40 MG tablet Take 1 tablet (40 mg total) by mouth daily. 08/31/19 08/12/22  Arnetha Courser, MD  pioglitazone (ACTOS) 30 MG tablet Take 30 mg by mouth daily. 07/12/19   [provider]  TRADJENTA 5 MG TABS tablet Take 5 mg by mouth daily.  07/01/18   [provider]      VITAL SIGNS:  Blood pressure 132/85, pulse 87, temperature 97.9 F (36.6 C), temperature source Oral, resp. rate 20, weight 59.4 kg, SpO2 99%.  PHYSICAL EXAMINATION:  Physical Exam  GENERAL:  89 y.o.-year-old Caucasian female patient lying in the bed with no acute distress.  EYES: Pupils equal, round, reactive to light and accommodation. No scleral icterus. Extraocular muscles intact.  HEENT: Head atraumatic,  normocephalic. Oropharynx and nasopharynx clear.  NECK:  Supple, no jugular venous distention. No thyroid enlargement, no tenderness.  LUNGS: Diminished bibasilar breath sounds with bibasal crackles.  No use of accessory muscles of respiration.  CARDIOVASCULAR: Regular rate and rhythm, S1, S2 normal. No murmurs, rubs, or gallops.  ABDOMEN: Soft, nondistended, nontender. Bowel sounds present. No organomegaly or mass.  EXTREMITIES: No pedal edema, cyanosis, or clubbing.  NEUROLOGIC: Cranial nerves II through XII are intact. Muscle strength 5/5 in all extremities. Sensation intact. Gait not checked.  PSYCHIATRIC: The patient is alert and oriented x 3.  Normal affect and good eye contact. SKIN: No obvious rash, lesion, or ulcer.   LABORATORY PANEL:   CBC Recent Labs  Lab 03/24/23 1935  WBC 9.5  HGB 11.8*  HCT 36.0  PLT 300   ------------------------------------------------------------------------------------------------------------------  Chemistries  Recent Labs  Lab 03/24/23 2346  NA 134*  K 3.8  CL 105  CO2 20*  GLUCOSE 103*  BUN 22  CREATININE 1.05*  CALCIUM 8.9   ------------------------------------------------------------------------------------------------------------------  Cardiac Enzymes No results for input(s): "TROPONINI" in the last 168 hours. ------------------------------------------------------------------------------------------------------------------  RADIOLOGY:  DG Chest Portable 1 View  Result Date: 03/25/2023 CLINICAL DATA:  Weakness EXAM: PORTABLE CHEST 1 VIEW COMPARISON:  Chest x-ray 09/10/2020 FINDINGS: The heart is enlarged, unchanged. There some minimal patchy opacities in the perihilar regions, left greater than right. There is no pleural effusion or pneumothorax. No acute fractures are seen. IMPRESSION: Minimal patchy opacities in the perihilar regions, left greater than right, could be infectious or inflammatory in etiology. Electronically  Signed   By: Darliss Cheney M.D.   On: 03/25/2023 01:09   CT Cervical Spine Wo Contrast  Result Date: 03/25/2023 CLINICAL DATA:  Neck pain.  History of parotid neoplasm. EXAM: CT CERVICAL SPINE WITHOUT CONTRAST TECHNIQUE: Multidetector CT imaging of the cervical spine was performed without intravenous contrast. Multiplanar CT image reconstructions were also generated. RADIATION DOSE REDUCTION: This exam was performed according to the departmental dose-optimization program which includes automated exposure control, adjustment of the mA and/or kV according to patient size and/or use of iterative reconstruction technique. COMPARISON:  PET-CT 09/28/2022 FINDINGS: Alignment: Normal. Skull base and vertebrae: No acute fracture. No primary bone lesion or focal pathologic process. The bones are diffusely osteopenic. Soft tissues and spinal canal: No prevertebral fluid or swelling. No visible canal hematoma. Disc levels: There is disc space narrowing and endplate osteophyte formation at C5-C6 and C6-C7. There is no severe central  canal or neural foraminal stenosis at any level. Upper chest: There are bilateral cavitary lesions in the upper lungs. These have increased in size and number compared to the PET-CT in September 28, 2022. The largest is in the right lung apex measuring 3.5 by 4.2 cm. Other: None. IMPRESSION: 1. No acute fracture or traumatic subluxation of the cervical spine. 2. Bilateral cavitary lesions in the upper lungs have increased in size and number compared to the PET-CT in September 28, 2022. The largest measures up to 4.2 cm. Electronically Signed   By: Darliss Cheney M.D.   On: 03/25/2023 00:28      IMPRESSION AND PLAN:  Assessment and Plan: * Community acquired pneumonia - This is likely bacterial bibasal community-acquired pneumonia. - The patient will be admitted to a medical telemetry bed. - Will continue antibiotic therapy with IV Rocephin and Zithromax. - Mucolytic therapy be provided as well  as duo nebs q.i.d. and q.4 hours p.r.n. - We will follow blood cultures.   Acute lower UTI - She will be placed on IV Rocephin as above. - Will follow urine and blood cultures.  Generalized weakness - This is likely secondary to #1 and #2. - Management as above. - PT consult to be obtained.  Elevated troponin - This is like secondary to demand ischemia. - We will follow serial troponins. - She had no chest pain.  Type 2 diabetes mellitus without complications (HCC) - The patient will be placed on supplemental coverage with NovoLog. - We will continue basal coverage. - We will continue glipizide ER and Actos.  Coronary artery disease - We will continue aspirin and Plavix.  Dyslipidemia - We will continue statin therapy.  GERD without esophagitis - Continue PPI therapy.  Hypothyroidism - We will continue Synthroid.  Essential hypertension - We will continue antihypertensive therapy.   DVT prophylaxis: Lovenox.  Advanced Care Planning:  Code Status: The patient is DNR and DNI. Family Communication:  The plan of care was discussed in details with the patient (and family). I answered all questions. The patient agreed to proceed with the above mentioned plan. Further management will depend upon hospital course. Disposition Plan: Back to previous home environment Consults called: none.  All the records are reviewed and case discussed with ED provider.  Status is: Inpatient  At the time of the admission, it appears that the appropriate admission status for this patient is inpatient.  This is judged to be reasonable and necessary in order to provide the required intensity of service to ensure the patient's safety given the presenting symptoms, physical exam findings and initial radiographic and laboratory data in the context of comorbid conditions.  The patient requires inpatient status due to high intensity of service, high risk of further deterioration and high frequency of  surveillance required.  I certify that at the time of admission, it is my clinical judgment that the patient will require inpatient hospital care extending more than 2 midnights.                            Dispo: The patient is from: Home              Anticipated d/c is to: Home              Patient currently is not medically stable to d/c.              Difficult to place patient: No  Cleda Imel A  Zeth Buday M.D on 03/25/2023 at 3:21 AM  [facility]   From 7 PM-7 AM, contact night-coverage [email or web address]  CC: Primary care physician; Jerl Mina, MD

## 2023-03-25 NOTE — Hospital Course (Addendum)
HPI: Erika Ochoa is a 89 y.o. Caucasian female with medical history significant for type II diabetes mellitus and hypertension, hx parotid gland cancer, who presented to the emergency room with acute onset of generalized weakness and diminished p.o. appetite as well as increased sleepiness over the last week.    Hospital course / significant events:  10/17: admitted to hospitalist service for CAP, UTI, elevated troponin likely demand ischemia. VSS. Started abx - ceftriaxone and azithromycin. Troponin remains flat 67 --> 56. Urine/Blood cultures pending. PT/OT consult given weakness.   Consultants:  ***  Procedures/Surgeries: ***      ASSESSMENT & PLAN:       *** based on BMI: Body mass index is 21.14 kg/m.   DVT prophylaxis: *** IV fluids: *** continuous IV fluids  Nutrition: *** Central lines / invasive devices: ***  Code Status: *** ACP documentation reviewed: *** none on file in [facility]  TOC needs: *** Barriers to dispo / significant pending items: ***

## 2023-03-25 NOTE — Progress Notes (Signed)
  Brief Progress Note (See full H&P from earlier today)   HPI: Erika Ochoa is a 89 y.o. Caucasian female with medical history significant for type II diabetes mellitus and hypertension, who presented to the emergency room with acute onset of generalized weakness and diminished p.o. appetite as well as increased sleepiness over the last week.    Hospital course / significant events:  10/17: admitted to hospitalist service for CAP, UTI, elevated troponin likely demand ischemia. VSS. Started abx - ceftriaxone and azithromycin. Troponin remains flat 67 --> 56. Urine/Blood cultures pending. PT/OT consult given weakness.   Consultants:  none  Procedures/Surgeries: none      Subjective: Pt sleepy but easily awoke, family states she is hard of hearing and tired, request I not bother her too much, state she has been doing a bit better compared to yesterday    Objective: Relevant new results:  Troponin trending down Physical Exam:  BP (!) 167/78   Pulse 93   Temp 97.9 F (36.6 C) (Oral)   Resp 17   Wt 59.4 kg Comment: From O/V note on 03/02/23  SpO2 96%   BMI 21.14 kg/m  Constitutional:  General Appearance: NAD Respiratory: Fair respiratory effort Breath sounds normal, no wheeze/rhonchi/rales Cardiovascular: S1/S2 normal, + murmur, no rub/gallop auscultated No lower extremity edema Gastrointestinal: Nontender, no masses Musculoskeletal:  No clubbing/cyanosis of digits    Assessment/Plan changes or updates compared to H&P: No updates from Dr Chrys Racer note earlier today

## 2023-03-25 NOTE — Assessment & Plan Note (Addendum)
-   The patient will be placed on supplemental coverage with NovoLog. - We will continue basal coverage. - We will continue glipizide ER and Actos.

## 2023-03-25 NOTE — Assessment & Plan Note (Signed)
-   She will be placed on IV Rocephin as above. - Will follow urine and blood cultures.

## 2023-03-25 NOTE — Assessment & Plan Note (Signed)
-   We will continue Synthroid. 

## 2023-03-25 NOTE — Assessment & Plan Note (Signed)
Continue PPI therapy.

## 2023-03-25 NOTE — ED Provider Notes (Signed)
1:16 AM  Assumed care of patient at shift change.  Patient with history of dementia with progressive weakness for the past week, failure to thrive, decreased oral intake.  Workup today remarkable for opacities noted in perihilar regions left greater than right that could be infectious in nature.  Will cover for community-acquired pneumonia.  Urine is also nitrite positive with pyuria, bacteria.  Antibiotics also cover for UTI.  Culture pending.  Will also obtain blood cultures.  Troponin also elevated.  She is not complaining of chest pain but is not a reliable historian secondary to dementia.  EKG nonischemic.  Will continue to trend her troponins.   1:40 AM Consulted and discussed patient's case with hospitalist, Dr. Arville Care.  I have recommended admission and consulting physician agrees and will place admission orders.  Patient (and family if present) agree with this plan.   I reviewed all nursing notes, vitals, pertinent previous records.  All labs, EKGs, imaging ordered have been independently reviewed and interpreted by myself.    Foster Frericks, Layla Maw, DO 03/25/23 0140

## 2023-03-25 NOTE — Assessment & Plan Note (Signed)
-   We will continue antihypertensive therapy.

## 2023-03-25 NOTE — Evaluation (Signed)
Physical Therapy Evaluation Patient Details Name: Erika Ochoa MRN: [id number] DOB: Jan 10, 1925 Today's Date: 03/25/2023  History of Present Illness  Pt is a 89 y.o. female with PMH significant for type II diabetes mellitus and hypertension, who presented to the emergency room 03/24/23 with acute onset of generalized weakness and diminished PO appetite as well as increased sleepiness over the last week.  Clinical Impression   Pt is received in bed with daughter-in-law Dois Davenport and family friend Bonita Quin at bedside, she is agreeable to PT session following encouragement. Pt's daughter-in-law assisted with subjective intake secondary to Pt history of dementia. At baseline, Pt's daughter-in-law reports Pt is a household amb only using SPC on R side, no recent falls, and requires assist with ADL/IADLs although able to self-feed. At beginning of session, Pt reports mod R shoulder pain. Pt performs bed mobility and seated EOB balance close supA for safety. Unable to assess further mobility at this time due to R shoulder pain and increase tiredness. Notified RN via secure chat regarding R shoulder pain. Pt would benefit from skilled PT to address above deficits and promote optimal return to PLOF.       If plan is discharge home, recommend the following: A little help with walking and/or transfers;A little help with bathing/dressing/bathroom;Assistance with cooking/housework;Assist for transportation;Help with stairs or ramp for entrance;Supervision due to cognitive status   Can travel by private vehicle        Equipment Recommendations Other (comment) (TBD at this time)  Recommendations for Other Services       Functional Status Assessment Patient has had a recent decline in their functional status and demonstrates the ability to make significant improvements in function in a reasonable and predictable amount of time.     Precautions / Restrictions Precautions Precautions: Fall Restrictions Weight  Bearing Restrictions: No      Mobility  Bed Mobility Overal bed mobility: Needs Assistance Bed Mobility: Supine to Sit, Sit to Supine     Supine to sit: Supervision, Min assist Sit to supine: Supervision   General bed mobility comments: Pt able to perform bed mobility (supine>sit) minA with BUE support but otherwise close supA    Transfers                   General transfer comment: unable to assess due to Pt reporting R shoulder pain    Ambulation/Gait               General Gait Details: unable to assess due to Pt reporting R shoulder pain and requesting to lay back in bed  Stairs            Wheelchair Mobility     Tilt Bed    Modified Rankin (Stroke Patients Only)       Balance Overall balance assessment: Needs assistance Sitting-balance support: Single extremity supported, Feet unsupported Sitting balance-Leahy Scale: Good Sitting balance - Comments: Pt able to maintain seated EOB balance during functional activities without LOB noted       Standing balance comment: unable to assess during session due to Pt refusing OOB mobility and reports of R shoulder pain                             Pertinent Vitals/Pain Pain Assessment Pain Assessment: Faces Faces Pain Scale: Hurts even more Pain Location: R shoulder (scapular spine near acromion) pain Pain Descriptors / Indicators: Aching, Grimacing, Guarding, Discomfort Pain Intervention(s): Limited activity  within patient's tolerance, Monitored during session    Home Living Family/patient expects to be discharged to:: Private residence Living Arrangements: Children Available Help at Discharge: Family;Available 24 hours/day Type of Home: House Home Access:  (one small step/threshold into home)       Home Layout: Two level;Able to live on main level with bedroom/bathroom Home Equipment: Gilmer Mor - single point      Prior Function Prior Level of Function : Needs assist        Physical Assist : Mobility (physical) Mobility (physical): Transfers;Gait   Mobility Comments: Pt's daughter-in-law Dois Davenport reports Pt uses SPC on R side to amb household distance only ADLs Comments: Pt requires assist with ADL/IADLs but able to self feed     Extremity/Trunk Assessment   Upper Extremity Assessment Upper Extremity Assessment: Overall WFL for tasks assessed    Lower Extremity Assessment Lower Extremity Assessment: Generalized weakness       Communication   Communication Communication: Other (comment) (HOH) Cueing Techniques: Verbal cues  Cognition Arousal: Lethargic Behavior During Therapy: WFL for tasks assessed/performed Overall Cognitive Status: History of cognitive impairments - at baseline                                 General Comments: Pleasant but tired at beginning of session, with encouragement able to participate        General Comments General comments (skin integrity, edema, etc.): No bruising noted during skin assessment of R shoulder area Pt was reporting pain    Exercises Other Exercises Other Exercises: Educated Pt on PT role and benefits behind mobility   Assessment/Plan    PT Assessment Patient needs continued PT services  PT Problem List Decreased strength;Decreased mobility;Decreased cognition;Decreased activity tolerance;Decreased balance;Pain       PT Treatment Interventions DME instruction;Gait training;Functional mobility training;Stair training;Therapeutic activities;Therapeutic exercise;Balance training    PT Goals (Current goals can be found in the Care Plan section)  Acute Rehab PT Goals Patient Stated Goal: to go home PT Goal Formulation: With patient Time For Goal Achievement: 04/08/23 Potential to Achieve Goals: Good    Frequency Min 1X/week     Co-evaluation               AM-PAC PT "6 Clicks" Mobility  Outcome Measure Help needed turning from your back to your side while in a flat bed  without using bedrails?: None Help needed moving from lying on your back to sitting on the side of a flat bed without using bedrails?: A Little Help needed moving to and from a bed to a chair (including a wheelchair)?: A Little Help needed standing up from a chair using your arms (e.g., wheelchair or bedside chair)?: A Little Help needed to walk in hospital room?: A Little Help needed climbing 3-5 steps with a railing? : A Lot 6 Click Score: 18    End of Session   Activity Tolerance: Patient limited by pain Patient left: in bed;with call bell/phone within reach;with bed alarm set;with family/visitor present Nurse Communication: Mobility status;Patient requests pain meds PT Visit Diagnosis: Unsteadiness on feet (R26.81);Muscle weakness (generalized) (M62.81);Pain;Other abnormalities of gait and mobility (R26.89) Pain - Right/Left: Left Pain - part of body: Shoulder    Time: 1610-9604 PT Time Calculation (min) (ACUTE ONLY): 10 min   Charges:                 Elmon Else, SPT   Syndi Pua 03/25/2023, 2:54  PM

## 2023-03-25 NOTE — ED Notes (Signed)
Pt disoriented to situation, place while attempting to draw troponin. Pt confused about labwork, not wanting this RN to draw blood work, "give it to someone and they get sick". Requesting to call back grandson "jeff" who was recently at bedside. Grandson called and able to comfort and reorient pt. Pt agreeable to additional lab work. Lights dimmed for comfort, bed locked in lowest position. Pt denies further needs.

## 2023-03-25 NOTE — TOC Initial Note (Signed)
Transition of Care [facility]) - Initial/Assessment Note    Patient Details  Name: Erika Ochoa MRN: [id number] Date of Birth: [birth year]  Transition of Care [facility]) CM/SW Contact:    Allena Katz, LCSW Phone Number: 03/25/2023, 3:31 PM  Clinical Narrative:   CSW called to speak with grandson about HH. Pt grandson states they had already started process of talking to PCP about hospice and would like to use [facility]. CSW has made referral. Lucila Maine reports he lives next door and pt lives with his mother.                Expected Discharge Plan: Home w Hospice Care Barriers to Discharge: Continued Medical Work up   Patient Goals and CMS Choice Patient states their goals for this hospitalization and ongoing recovery are:: return home CMS Medicare.gov Compare Post Acute Care list provided to:: Patient Represenative (must comment) (grandson POA)        Expected Discharge Plan and Services                                              Prior Living Arrangements/Services   Lives with::  (Lives with grandsons mother)                   Activities of Daily Living   ADL Screening (condition at time of admission) Independently performs ADLs?: No Does the patient have a NEW difficulty with bathing/dressing/toileting/self-feeding that is expected to last >3 days?: Yes (Initiates electronic notice to provider for possible OT consult) Does the patient have a NEW difficulty with getting in/out of bed, walking, or climbing stairs that is expected to last >3 days?: Yes (Initiates electronic notice to provider for possible PT consult) Does the patient have a NEW difficulty with communication that is expected to last >3 days?: No Is the patient deaf or have difficulty hearing?: Yes Does the patient have difficulty seeing, even when wearing glasses/contacts?: No Does the patient have difficulty concentrating, remembering, or making decisions?: Yes  Permission Sought/Granted                   Emotional Assessment       Orientation: : Fluctuating Orientation (Suspected and/or reported Sundowners)      Admission diagnosis:  Community acquired pneumonia [J18.9] Acute UTI [N39.0] Generalized weakness [R53.1] Community acquired pneumonia, unspecified laterality [J18.9] Patient Active Problem List   Diagnosis Date Noted   Community acquired pneumonia 03/25/2023   Acute lower UTI 03/25/2023   Type 2 diabetes mellitus without complications (HCC) 03/25/2023   Generalized weakness 03/25/2023   GERD without esophagitis 03/25/2023   Dyslipidemia 03/25/2023   Coronary artery disease 03/25/2023   Carcinoma of parotid gland (HCC) 04/17/2022   Hypomagnesemia    NSTEMI (non-ST elevated myocardial infarction) (HCC) 09/08/2020   CKD stage 4 due to type 2 diabetes mellitus (HCC) 09/08/2020   Hyponatremia 09/08/2020   Symptomatic anemia 08/30/2019   Gout 08/30/2019   Elevated troponin 08/30/2019   GIB (gastrointestinal bleeding) 06/10/2018   Cellulitis of leg, left 03/07/2018   Bilateral edema of lower extremity 03/07/2018   Bilateral lower extremity pain 03/07/2018   Cystocele, midline 08/12/2016   Uterovaginal prolapse, incomplete 08/12/2016   History of transient ischemic attack (TIA) 02/05/2016   TIA (transient ischemic attack) 12/19/2015   Acute kidney injury superimposed on CKD (HCC) 12/19/2015   Primary osteoarthritis of  both first carpometacarpal joints 07/24/2015   B12 deficiency 02/25/2015   SVT (supraventricular tachycardia) (HCC) 07/11/2014   Cervical pain 07/11/2014   CAD (coronary artery disease) 07/11/2014   Benign essential HTN 07/11/2014   HLD (hyperlipidemia) 07/11/2014   Type 2 diabetes mellitus with hyperlipidemia (HCC) 07/11/2014   Essential hypertension 07/11/2014   Hypothyroidism 07/11/2014   PCP:  Jerl Mina, MD Pharmacy:   [facility] [address], [place] - [address] [place] [address] Phone:  [phone] Fax: [phone]     Social Determinants of Health (SDOH) Social History: SDOH Screenings   Food Insecurity: No Food Insecurity (03/25/2023)  Housing: Low Risk  (03/25/2023)  Transportation Needs: No Transportation Needs (03/25/2023)  Utilities: Not At Risk (03/25/2023)  Financial Resource Strain: Low Risk  (12/22/2022)   Received from [facility]  Tobacco Use: Low Risk  (03/24/2023)   SDOH Interventions:     Readmission Risk Interventions     No data to display

## 2023-03-25 NOTE — Assessment & Plan Note (Signed)
-   This is likely bacterial bibasal community-acquired pneumonia. - The patient will be admitted to a medical telemetry bed. - Will continue antibiotic therapy with IV Rocephin and Zithromax. - Mucolytic therapy be provided as well as duo nebs q.i.d. and q.4 hours p.r.n. - We will follow blood cultures.

## 2023-03-26 DIAGNOSIS — J189 Pneumonia, unspecified organism: Secondary | ICD-10-CM | POA: Diagnosis not present

## 2023-03-26 LAB — BASIC METABOLIC PANEL
Anion gap: 7 (ref 5–15)
BUN: 21 mg/dL (ref 8–23)
CO2: 24 mmol/L (ref 22–32)
Calcium: 8.8 mg/dL — ABNORMAL LOW (ref 8.9–10.3)
Chloride: 106 mmol/L (ref 98–111)
Creatinine, Ser: 1.13 mg/dL — ABNORMAL HIGH (ref 0.44–1.00)
GFR, Estimated: 45 mL/min — ABNORMAL LOW (ref >60)
Glucose, Bld: 125 mg/dL — ABNORMAL HIGH (ref 70–99)
Potassium: 3.8 mmol/L (ref 3.5–5.1)
Sodium: 137 mmol/L (ref 135–145)

## 2023-03-26 LAB — CBC
HCT: 30.5 % — ABNORMAL LOW (ref 36.0–46.0)
Hemoglobin: 10.1 g/dL — ABNORMAL LOW (ref 12.0–15.0)
MCH: 30.4 pg (ref 26.0–34.0)
MCHC: 33.1 g/dL (ref 30.0–36.0)
MCV: 91.9 fL (ref 80.0–100.0)
Platelets: 258 10*3/uL (ref 150–400)
RBC: 3.32 MIL/uL — ABNORMAL LOW (ref 3.87–5.11)
RDW: 17.2 % — ABNORMAL HIGH (ref 11.5–15.5)
WBC: 8.6 10*3/uL (ref 4.0–10.5)
nRBC: 0 % (ref 0.0–0.2)

## 2023-03-26 MED ORDER — CEFPODOXIME PROXETIL 100 MG PO TABS: 100.0000 mg | ORAL_TABLET | Freq: Two times a day (BID) | ORAL | 0 refills | Status: AC

## 2023-03-26 MED ORDER — TRAZODONE HCL 50 MG PO TABS: 25.0000 mg | ORAL_TABLET | Freq: Every evening | ORAL | 0 refills | Status: DC | PRN

## 2023-03-26 MED ORDER — POLYETHYLENE GLYCOL 3350 17 G PO PACK
17.0000 g | PACK | Freq: Every day | ORAL | 0 refills | Status: DC
Start: 2023-03-26 — End: 2023-05-13

## 2023-03-26 MED ORDER — ACETAMINOPHEN 325 MG PO TABS: 325.0000 mg | ORAL_TABLET | Freq: Four times a day (QID) | ORAL | Status: DC | PRN

## 2023-03-26 MED ORDER — AZITHROMYCIN 250 MG PO TABS: 250.0000 mg | ORAL_TABLET | Freq: Every day | ORAL | 0 refills | Status: AC

## 2023-03-26 MED ORDER — ONDANSETRON HCL 4 MG PO TABS: 4.0000 mg | ORAL_TABLET | Freq: Four times a day (QID) | ORAL | 0 refills | Status: DC | PRN

## 2023-03-26 MED ORDER — HYDROCOD POLI-CHLORPHE POLI ER 10-8 MG/5ML PO SUER: 5.0000 mL | Freq: Two times a day (BID) | ORAL | 0 refills | Status: DC | PRN

## 2023-03-26 MED ORDER — HYDROCODONE-ACETAMINOPHEN 10-325 MG PO TABS: 0.5000 | ORAL_TABLET | ORAL | 0 refills | Status: AC | PRN

## 2023-03-26 NOTE — Progress Notes (Signed)
Nutrition Brief Note  Chart reviewed. Plan for pt to d/c home with hospice. Per hospice RN, MD will likely discharge home today.  No further nutrition interventions planned at this time.  Please re-consult as needed.   Levada Schilling, RD, LDN, CDCES Registered Dietitian III Certified Diabetes Care and Education Specialist Please refer to [facility] for RD and/or RD on-call/weekend/after hours pager

## 2023-03-26 NOTE — Plan of Care (Signed)
  Problem: Activity: Goal: Ability to tolerate increased activity will improve Outcome: Progressing   Problem: Clinical Measurements: Goal: Ability to maintain a body temperature in the normal range will improve Outcome: Progressing   Problem: Respiratory: Goal: Ability to maintain adequate ventilation will improve Outcome: Progressing Goal: Ability to maintain a clear airway will improve Outcome: Progressing   Problem: Education: Goal: Understanding of post-operative needs will improve Outcome: Progressing Goal: Individualized Educational Video(s) Outcome: Progressing   Problem: Clinical Measurements: Goal: Postoperative complications will be avoided or minimized Outcome: Progressing   Problem: Respiratory: Goal: Will regain and/or maintain adequate ventilation Outcome: Progressing   Problem: Education: Goal: Knowledge of General Education information will improve Description: Including pain rating scale, medication(s)/side effects and non-pharmacologic comfort measures Outcome: Progressing   Problem: Health Behavior/Discharge Planning: Goal: Ability to manage health-related needs will improve Outcome: Progressing   Problem: Clinical Measurements: Goal: Ability to maintain clinical measurements within normal limits will improve Outcome: Progressing Goal: Will remain free from infection Outcome: Progressing Goal: Diagnostic test results will improve Outcome: Progressing Goal: Respiratory complications will improve Outcome: Progressing Goal: Cardiovascular complication will be avoided Outcome: Progressing   Problem: Activity: Goal: Risk for activity intolerance will decrease Outcome: Progressing   Problem: Nutrition: Goal: Adequate nutrition will be maintained Outcome: Progressing   Problem: Coping: Goal: Level of anxiety will decrease Outcome: Progressing   Problem: Elimination: Goal: Will not experience complications related to bowel motility Outcome:  Progressing Goal: Will not experience complications related to urinary retention Outcome: Progressing   Problem: Pain Managment: Goal: General experience of comfort will improve Outcome: Progressing   Problem: Safety: Goal: Ability to remain free from injury will improve Outcome: Progressing   Problem: Skin Integrity: Goal: Risk for impaired skin integrity will decrease Outcome: Progressing

## 2023-03-26 NOTE — TOC Transition Note (Addendum)
Transition of Care [facility]) - CM/SW Discharge Note   Patient Details  Name: Erika Ochoa MRN: [id number] Date of Birth: 1924-10-10  Transition of Care [facility]) CM/SW Contact:  Liliana Cline, LCSW Phone Number: 03/26/2023, 12:47 PM   Clinical Narrative:    Spoke to daughter in Animal nutritionist by phone. Inquired which hospice agency they want. She states they want [facility]. She confirmed the address in patient's chart is correct. She states she will call CSW if they need EMS transport. She states [facility] has scheduled their visit for 6pm today - CSW confirmed this with Cala Bradford with [facility] as well. Updated Diannia Ruder with [facility] to cancel referral. Updated RN and DO.  1:15- Call from granddaughter in law Lupita Leash who states they want [facility]. Lupita Leash also states they do not need EMS transport.  Called [place] with [facility] then 3 way call with Dois Davenport who confirms they want [facility].  Updated Diannia Ruder with [facility]. Per Diannia Ruder, next available home visit is Sunday. Diannia Ruder to update family on scheduling the visit.  Updated RN and DO.   Final next level of care: Home w Hospice Care Barriers to Discharge: Barriers Resolved   Patient Goals and CMS Choice CMS Medicare.gov Compare Post Acute Care list provided to:: Patient Represenative (must comment) Choice offered to / list presented to : Adult Children  Discharge Placement                    Name of family member notified: Dois Davenport - dil Patient and family notified of of transfer: 03/26/23  Discharge Plan and Services Additional resources added to the After Visit Summary for                                       Social Determinants of Health (SDOH) Interventions SDOH Screenings   Food Insecurity: No Food Insecurity (03/25/2023)  Housing: Low Risk  (03/25/2023)  Transportation Needs: No Transportation Needs (03/25/2023)  Utilities: Not At Risk (03/25/2023)  Financial Resource Strain: Low Risk  (12/22/2022)   Received  from [facility]  Tobacco Use: Low Risk  (03/24/2023)     Readmission Risk Interventions     No data to display

## 2023-03-26 NOTE — Progress Notes (Signed)
OT Cancellation Note  Patient Details Name: Erika Ochoa MRN: [id number] DOB: 02/09/1925   Cancelled Treatment:    Reason Eval/Treat Not Completed: Other (comment) (per chart, pt is planning to discharge home with hospice.No acute OT needs identified. OT will sign off.) Oleta Mouse, OTD OTR/L  03/26/23, 3:09 PM

## 2023-03-26 NOTE — Plan of Care (Signed)
[facility] Tonsillectomy/Adenoidectomy, Postoperative PEDS care plan entered in error.

## 2023-03-26 NOTE — Discharge Summary (Signed)
Physician Discharge Summary   Patient: Erika Ochoa MRN: [id number]  DOB: [birth year]   Admit:     Date of Admission: 03/24/2023 Admitted from: home   Discharge: Date of discharge: 03/26/23 Disposition:  home w/ hospice Condition at discharge: fair  CODE STATUS: DNR     Discharge Physician: Sunnie Nielsen, DO [facility]     PCP: Jerl Mina, MD  Recommendations for Outpatient Follow-up:  Follow up with PCP Jerl Mina, MD ASAP - or hospice provider may be sufficient if option for comfort measures only and no further treatment of underlying conditions  Please follow up on the following pending results: follow final cultures for blood and urine,     Discharge Instructions     Amb Referral to Palliative Care   Complete by: As directed    Diet - low sodium heart healthy   Complete by: As directed    Increase activity slowly   Complete by: As directed          Discharge Diagnoses: Principal Problem:   Community acquired pneumonia Active Problems:   Acute lower UTI   Generalized weakness   Elevated troponin   Type 2 diabetes mellitus without complications (HCC)   Essential hypertension   Hypothyroidism   GERD without esophagitis   Dyslipidemia   Coronary artery disease        HPI: Erika Ochoa is a 89 y.o. Caucasian female with medical history significant for type II diabetes mellitus and hypertension, hx parotid gland cancer, who presented to the emergency room with acute onset of generalized weakness and diminished p.o. appetite as well as increased sleepiness over the last week.    Hospital course / significant events:  10/17: admitted to hospitalist service for CAP, UTI, elevated troponin likely demand ischemia. VSS. Started abx - ceftriaxone and azithromycin. Troponin remains flat 67 --> 56. Urine/Blood cultures pending. PT/OT consult given weakness.  10/18: family discussing d/c aggressive measures and focus on comfort given  dementia and chronic pain, would like to discuss hospice and are comfortable with discharge w/ palliative care follow-up. D/c home on abx. UCx (+)Ecoli pending s/s. Grandson requesting to stop meds which are not directly helpful to QOL, see med rec   Consultants:  none  Procedures/Surgeries: None    ASSESSMENT & PLAN:   Community acquired pneumonia Ceph + azithro Symptomatic care    Acute lower UTI UCx EColi Question asymptomatic bacteriuria but abx should cover urinary pathogen   Chronic pain  Subacute R arm pain uncertain cause  Outpatient f/u Pain control   Generalized weakness Chronic Not able to participate w/ PT Hospice/palliative to follow    Elevated troponin Demand ischemia, no chest pain Monitor as needed   Type 2 diabetes mellitus without complications (HCC) A1C indicates good control Concern for hypoglycemia D/c antihyperglycemics on discharge    Coronary artery disease Hx Paroxysmal Afib not on AC HLD Have d/c ASA and statin on discharge, grandson aware increased risk MI/CVA    GERD without esophagitis Continue PPI therapy.   Hypothyroidism continue Synthroid.   Essential hypertension Have d/c antihypertensives             Discharge Instructions  Allergies as of 03/26/2023       Reactions   Augmentin [amoxicillin-pot Clavulanate] Swelling   Facial and lip swelling 05/29/22   Oxycodone Hcl Other (See Comments)   Other Reaction: GI Upset Other reaction(s): Other (See Comments) Other Reaction: GI Upset   Oxycodone-aspirin Other (See Comments)  Other Reaction: GI Upset (Per pt she does take aspirin 81 mg daily   Codeine Nausea Only   Etodolac Other (See Comments)   Iodine Nausea Only   Levaquin [levofloxacin In D5w] Nausea Only   Levofloxacin Nausea Only   Metronidazole Nausea Only   Ultram [tramadol] Nausea Only   Sulfa Antibiotics Nausea Only, Other (See Comments)   Other reaction(s): Unknown        Medication List      STOP taking these medications    acetaminophen 650 MG CR tablet Commonly known as: TYLENOL Replaced by: acetaminophen 325 MG tablet   clopidogrel 75 MG tablet Commonly known as: PLAVIX   cyanocobalamin 1000 MCG tablet   diltiazem 120 MG 24 hr capsule Commonly known as: CARDIZEM CD   donepezil 5 MG tablet Commonly known as: ARICEPT   ferrous sulfate 325 (65 FE) MG EC tablet   glipiZIDE 5 MG 24 hr tablet Commonly known as: GLUCOTROL XL   irbesartan 300 MG tablet Commonly known as: AVAPRO   isosorbide mononitrate 30 MG 24 hr tablet Commonly known as: IMDUR   Lantus 100 UNIT/ML injection Generic drug: insulin glargine   Mag-Oxide 200 MG Tabs Generic drug: Magnesium Oxide -Mg Supplement   metoprolol tartrate 50 MG tablet Commonly known as: LOPRESSOR   mupirocin ointment 2 % Commonly known as: BACTROBAN   pioglitazone 30 MG tablet Commonly known as: ACTOS   rosuvastatin 5 MG tablet Commonly known as: CRESTOR   Tradjenta 5 MG Tabs tablet Generic drug: linagliptin   Vitamin D (Ergocalciferol) 1.25 MG (50000 UNIT) Caps capsule Commonly known as: DRISDOL       TAKE these medications    acetaminophen 325 MG tablet Commonly known as: TYLENOL Take 1 tablet (325 mg total) by mouth every 6 (six) hours as needed for mild pain (pain score 1-3) (or Fever >/= 101). Replaces: acetaminophen 650 MG CR tablet   aspirin EC 81 MG tablet Take 1 tablet (81 mg total) by mouth daily. Swallow whole.   azithromycin 250 MG tablet Commonly known as: Zithromax Take 1 tablet (250 mg total) by mouth daily for 3 days. 2 tabs po x1 on Day 1, then 1 tab po daily on Days 2 - 5 Start taking on: March 27, 2023   cefpodoxime 100 MG tablet Commonly known as: VANTIN Take 1 tablet (100 mg total) by mouth 2 (two) times daily for 7 doses. Start evening 03/26/23   chlorpheniramine-HYDROcodone 10-8 MG/5ML Commonly known as: TUSSIONEX Take 5 mLs by mouth every 12 (twelve) hours as  needed for cough.   HYDROcodone-acetaminophen 10-325 MG tablet Commonly known as: NORCO Take 0.5-1 tablets by mouth every 4 (four) hours as needed for up to 5 days for severe pain (pain score 7-10) or moderate pain (pain score 4-6).   levothyroxine 50 MCG tablet Commonly known as: SYNTHROID Take 50 mcg by mouth daily.   nitroGLYCERIN 0.4 MG SL tablet Commonly known as: NITROSTAT Place 1 tablet (0.4 mg total) under the tongue every 5 (five) minutes x 3 doses as needed for chest pain (max 3).   ondansetron 4 MG tablet Commonly known as: ZOFRAN Take 1 tablet (4 mg total) by mouth every 6 (six) hours as needed for nausea or vomiting.   pantoprazole 40 MG tablet Commonly known as: Protonix Take 1 tablet (40 mg total) by mouth daily.   polyethylene glycol 17 g packet Commonly known as: MIRALAX / GLYCOLAX Take 17 g by mouth daily.   traZODone 50 MG tablet  Commonly known as: DESYREL Take 0.5-1 tablets (25-50 mg total) by mouth at bedtime as needed for sleep.          Allergies  Allergen Reactions   Augmentin [Amoxicillin-Pot Clavulanate] Swelling    Facial and lip swelling 05/29/22   Oxycodone Hcl Other (See Comments)    Other Reaction: GI Upset Other reaction(s): Other (See Comments) Other Reaction: GI Upset    Oxycodone-Aspirin Other (See Comments)    Other Reaction: GI Upset (Per pt she does take aspirin 81 mg daily   Codeine Nausea Only   Etodolac Other (See Comments)   Iodine Nausea Only   Levaquin [Levofloxacin In D5w] Nausea Only   Levofloxacin Nausea Only   Metronidazole Nausea Only   Ultram [Tramadol] Nausea Only   Sulfa Antibiotics Nausea Only and Other (See Comments)    Other reaction(s): Unknown      Subjective: limited subjective assessment, pt keeps complaining about R arm soreness and asking what time it is, deniess trouble breathing or chest pain    Discharge Exam: BP (!) 123/59 (BP Location: Right Arm)   Pulse 85   Temp 98 F (36.7 C)    Resp 20   Wt 59.4 kg Comment: From O/V note on 03/02/23  SpO2 93%   BMI 21.14 kg/m  General: Pt is alert, asleep on entry to room but then awake easily, uncomfortable but not in distress Cardiovascular: RRR, S1/S2 +, no rubs, no gallops Respiratory: CTA bilaterally, no wheezing, no rhonchi Abdominal: Soft, NT, ND Extremities: no edema, no cyanosis     The results of significant diagnostics from this hospitalization (including imaging, microbiology, ancillary and laboratory) are listed below for reference.     Microbiology: Recent Results (from the past 240 hour(s))  Urine Culture     Status: Abnormal (Preliminary result)   Collection Time: 03/24/23 11:46 PM   Specimen: Urine, Random  Result Value Ref Range Status   Specimen Description   Final    URINE, RANDOM Performed at [facility], [address], [place], [place] [address]    Special Requests   Final    NONE Performed at [facility], [address], [place], [place] [address]    Culture >=100,000 COLONIES/mL ESCHERICHIA COLI (A)  Final   Report Status PENDING  Incomplete  Blood culture (routine x 2)     Status: None (Preliminary result)   Collection Time: 03/25/23  2:04 AM   Specimen: BLOOD  Result Value Ref Range Status   Specimen Description BLOOD  RFA  Final   Special Requests   Final    BOTTLES DRAWN AEROBIC AND ANAEROBIC Blood Culture results may not be optimal due to an inadequate volume of blood received in culture bottles   Culture   Final    NO GROWTH 1 DAY Performed at [facility], [address], [place], [place] [address]    Report Status PENDING  Incomplete  Blood culture (routine x 2)     Status: None (Preliminary result)   Collection Time: 03/25/23  2:04 AM   Specimen: BLOOD  Result Value Ref Range Status   Specimen Description BLOOD BLOOD RIGHT ARM  Final   Special Requests   Final    BOTTLES DRAWN AEROBIC AND ANAEROBIC Blood Culture adequate volume   Culture    Final    NO GROWTH 1 DAY Performed at [facility], [address], [place], [place] [address]    Report Status PENDING  Incomplete  Labs: BNP (last 3 results) No results for input(s): "BNP" in the last 8760 hours. Basic Metabolic Panel: Recent Labs  Lab 03/24/23 2346 03/25/23 0416 03/26/23 0448  NA 134* 136 137  K 3.8 3.7 3.8  CL 105 105 106  CO2 20* 19* 24  GLUCOSE 103* 113* 125*  BUN 22 23 21   CREATININE 1.05* 1.01* 1.13*  CALCIUM 8.9 9.1 8.8*   Liver Function Tests: No results for input(s): "AST", "ALT", "ALKPHOS", "BILITOT", "PROT", "ALBUMIN" in the last 168 hours. No results for input(s): "LIPASE", "AMYLASE" in the last 168 hours. No results for input(s): "AMMONIA" in the last 168 hours. CBC: Recent Labs  Lab 03/24/23 1935 03/25/23 0416 03/26/23 0448  WBC 9.5 10.9* 8.6  HGB 11.8* 11.2* 10.1*  HCT 36.0 34.7* 30.5*  MCV 92.1 93.5 91.9  PLT 300 276 258   Cardiac Enzymes: No results for input(s): "CKTOTAL", "CKMB", "CKMBINDEX", "TROPONINI" in the last 168 hours. BNP: Invalid input(s): "POCBNP" CBG: No results for input(s): "GLUCAP" in the last 168 hours. D-Dimer No results for input(s): "DDIMER" in the last 72 hours. Hgb A1c No results for input(s): "HGBA1C" in the last 72 hours. Lipid Profile No results for input(s): "CHOL", "HDL", "LDLCALC", "TRIG", "CHOLHDL", "LDLDIRECT" in the last 72 hours. Thyroid function studies No results for input(s): "TSH", "T4TOTAL", "T3FREE", "THYROIDAB" in the last 72 hours.  Invalid input(s): "FREET3" Anemia work up No results for input(s): "VITAMINB12", "FOLATE", "FERRITIN", "TIBC", "IRON", "RETICCTPCT" in the last 72 hours. Urinalysis    Component Value Date/Time   COLORURINE YELLOW (A) 03/24/2023 2346   APPEARANCEUR HAZY (A) 03/24/2023 2346   APPEARANCEUR Clear 05/05/2014 1202   LABSPEC 1.027 03/24/2023 2346   LABSPEC 1.008 05/05/2014 1202   PHURINE 5.0 03/24/2023 2346   GLUCOSEU NEGATIVE 03/24/2023  2346   GLUCOSEU Negative 05/05/2014 1202   HGBUR NEGATIVE 03/24/2023 2346   BILIRUBINUR NEGATIVE 03/24/2023 2346   BILIRUBINUR Negative 05/05/2014 1202   KETONESUR 5 (A) 03/24/2023 2346   PROTEINUR NEGATIVE 03/24/2023 2346   NITRITE POSITIVE (A) 03/24/2023 2346   LEUKOCYTESUR NEGATIVE 03/24/2023 2346   LEUKOCYTESUR 1+ 05/05/2014 1202   Sepsis Labs Recent Labs  Lab 03/24/23 1935 03/25/23 0416 03/26/23 0448  WBC 9.5 10.9* 8.6   Microbiology Recent Results (from the past 240 hour(s))  Urine Culture     Status: Abnormal (Preliminary result)   Collection Time: 03/24/23 11:46 PM   Specimen: Urine, Random  Result Value Ref Range Status   Specimen Description   Final    URINE, RANDOM Performed at [facility], [address], [place], [place] [address]    Special Requests   Final    NONE Performed at [facility], [address] [place], [place] [address]    Culture >=100,000 COLONIES/mL ESCHERICHIA COLI (A)  Final   Report Status PENDING  Incomplete  Blood culture (routine x 2)     Status: None (Preliminary result)   Collection Time: 03/25/23  2:04 AM   Specimen: BLOOD  Result Value Ref Range Status   Specimen Description BLOOD  RFA  Final   Special Requests   Final    BOTTLES DRAWN AEROBIC AND ANAEROBIC Blood Culture results may not be optimal due to an inadequate volume of blood received in culture bottles   Culture   Final    NO GROWTH 1 DAY Performed at [facility], [address] [place], [place] [address]    Report Status PENDING  Incomplete  Blood culture (routine x 2)  Status: None (Preliminary result)   Collection Time: 03/25/23  2:04 AM   Specimen: BLOOD  Result Value Ref Range Status   Specimen Description BLOOD BLOOD RIGHT ARM  Final   Special Requests   Final    BOTTLES DRAWN AEROBIC AND ANAEROBIC Blood Culture adequate volume   Culture   Final    NO GROWTH 1 DAY Performed at [facility], [address], [place], [place] [address]    Report Status PENDING  Incomplete   Imaging DG Shoulder Right  Result Date: 03/25/2023 CLINICAL DATA:  Shoulder pain EXAM: RIGHT SHOULDER - 2+ VIEW COMPARISON:  None Available. FINDINGS: No fracture or malalignment.  Mild AC joint degenerative change. IMPRESSION: No acute osseous abnormality Electronically Signed   By: Jasmine Pang M.D.   On: 03/25/2023 21:36   DG Chest Portable 1 View  Result Date: 03/25/2023 CLINICAL DATA:  Weakness EXAM: PORTABLE CHEST 1 VIEW COMPARISON:  Chest x-ray 09/10/2020 FINDINGS: The heart is enlarged, unchanged. There some minimal patchy opacities in the perihilar regions, left greater than right. There is no pleural effusion or pneumothorax. No acute fractures are seen. IMPRESSION: Minimal patchy opacities in the perihilar regions, left greater than right, could be infectious or inflammatory in etiology. Electronically Signed   By: Darliss Cheney M.D.   On: 03/25/2023 01:09   CT Cervical Spine Wo Contrast  Result Date: 03/25/2023 CLINICAL DATA:  Neck pain.  History of parotid neoplasm. EXAM: CT CERVICAL SPINE WITHOUT CONTRAST TECHNIQUE: Multidetector CT imaging of the cervical spine was performed without intravenous contrast. Multiplanar CT image reconstructions were also generated. RADIATION DOSE REDUCTION: This exam was performed according to the departmental dose-optimization program which includes automated exposure control, adjustment of the mA and/or kV according to patient size and/or use of iterative reconstruction technique. COMPARISON:  PET-CT 09/28/2022 FINDINGS: Alignment: Normal. Skull base and vertebrae: No acute fracture. No primary bone lesion or focal pathologic process. The bones are diffusely osteopenic. Soft tissues and spinal canal: No prevertebral fluid or swelling. No visible canal hematoma. Disc levels: There is disc space narrowing and endplate osteophyte formation at C5-C6 and C6-C7. There is no severe central  canal or neural foraminal stenosis at any level. Upper chest: There are bilateral cavitary lesions in the upper lungs. These have increased in size and number compared to the PET-CT in September 28, 2022. The largest is in the right lung apex measuring 3.5 by 4.2 cm. Other: None. IMPRESSION: 1. No acute fracture or traumatic subluxation of the cervical spine. 2. Bilateral cavitary lesions in the upper lungs have increased in size and number compared to the PET-CT in September 28, 2022. The largest measures up to 4.2 cm. Electronically Signed   By: Darliss Cheney M.D.   On: 03/25/2023 00:28      Time coordinating discharge: over 30 minutes  SIGNED:  Sunnie Nielsen DO [facility]

## 2023-03-26 NOTE — TOC Progression Note (Signed)
Transition of Care [facility]) - Progression Note    Patient Details  Name: Erika Ochoa MRN: [id number] Date of Birth: 02/16/1925  Transition of Care [facility] CM/SW Contact  Liliana Cline, LCSW Phone Number: 03/26/2023, 11:53 AM  Clinical Narrative:    CSW received message from [place] with [facility] and VM from [facility] stating they had sent a referral to [facility] yesterday. Per chart review patient was referred to [facility] yesterday. CSW attempted call to grandson Trey Paula requesting return call- need to determine agency preference.   Expected Discharge Plan: Home w Hospice Care Barriers to Discharge: Continued Medical Work up  Expected Discharge Plan and Services                                               Social Determinants of Health (SDOH) Interventions SDOH Screenings   Food Insecurity: No Food Insecurity (03/25/2023)  Housing: Low Risk  (03/25/2023)  Transportation Needs: No Transportation Needs (03/25/2023)  Utilities: Not At Risk (03/25/2023)  Financial Resource Strain: Low Risk  (12/22/2022)   Received from [facility]  Tobacco Use: Low Risk  (03/24/2023)    Readmission Risk Interventions     No data to display

## 2023-03-26 NOTE — Progress Notes (Signed)
PT Cancellation Note  Patient Details Name: Erika Ochoa MRN: [id number] DOB: Mar 11, 1925   Cancelled Treatment:    Reason Eval/Treat Not Completed: Other (comment). Pt's grandson, Trey Paula, requested hold on PT at this time because he's waiting to talk with Palliative Care regarding comfort care. Will re-attempt PT visit following palliative care consult.   Michael Ventresca 03/26/2023, 11:54 AM

## 2023-03-27 LAB — URINE CULTURE: Culture: >=100000 — AB

## 2023-03-30 LAB — CULTURE, BLOOD (ROUTINE X 2)
Culture: NO GROWTH
Culture: NO GROWTH
Special Requests: ADEQUATE

## 2023-04-09 DEATH — deceased
